# Patient Record
Sex: Female | Born: 1950 | Race: White | Hispanic: No | Smoking: Never smoker
Health system: Southern US, Community
[De-identification: ages and names within clinical notes are randomized; demographics above are authoritative.]

## PROBLEM LIST (undated history)

## (undated) DIAGNOSIS — Z9889 Other specified postprocedural states: Secondary | ICD-10-CM

## (undated) DIAGNOSIS — C801 Malignant (primary) neoplasm, unspecified: Secondary | ICD-10-CM

## (undated) DIAGNOSIS — R112 Nausea with vomiting, unspecified: Secondary | ICD-10-CM

## (undated) DIAGNOSIS — H3530 Unspecified macular degeneration: Secondary | ICD-10-CM

## (undated) HISTORY — PX: ABDOMINAL HYSTERECTOMY: SHX81

## (undated) HISTORY — PX: MASTECTOMY: SHX3

## (undated) HISTORY — PX: BREAST SURGERY: SHX581

## (undated) HISTORY — PX: TURBINATE REDUCTION: SHX6157

## (undated) HISTORY — PX: BREAST EXCISIONAL BIOPSY: SUR124

---

## 1997-12-28 ENCOUNTER — Other Ambulatory Visit: Admission: RE | Admit: 1997-12-28 | Discharge: 1997-12-28 | Payer: Self-pay | Admitting: Obstetrics and Gynecology

## 1999-03-08 ENCOUNTER — Other Ambulatory Visit: Admission: RE | Admit: 1999-03-08 | Discharge: 1999-03-08 | Payer: Self-pay | Admitting: Obstetrics and Gynecology

## 2000-02-10 ENCOUNTER — Other Ambulatory Visit: Admission: RE | Admit: 2000-02-10 | Discharge: 2000-02-10 | Payer: Self-pay | Admitting: Obstetrics and Gynecology

## 2000-02-10 ENCOUNTER — Encounter (INDEPENDENT_AMBULATORY_CARE_PROVIDER_SITE_OTHER): Payer: Self-pay | Admitting: Specialist

## 2000-02-21 ENCOUNTER — Other Ambulatory Visit: Admission: RE | Admit: 2000-02-21 | Discharge: 2000-02-21 | Payer: Self-pay | Admitting: Obstetrics and Gynecology

## 2000-08-07 ENCOUNTER — Inpatient Hospital Stay (HOSPITAL_COMMUNITY): Admission: RE | Admit: 2000-08-07 | Discharge: 2000-08-09 | Payer: Self-pay | Admitting: Obstetrics and Gynecology

## 2000-08-07 ENCOUNTER — Encounter (INDEPENDENT_AMBULATORY_CARE_PROVIDER_SITE_OTHER): Payer: Self-pay

## 2001-08-24 ENCOUNTER — Emergency Department (HOSPITAL_COMMUNITY): Admission: EM | Admit: 2001-08-24 | Discharge: 2001-08-24 | Payer: Self-pay | Admitting: Emergency Medicine

## 2001-08-24 ENCOUNTER — Encounter: Payer: Self-pay | Admitting: Emergency Medicine

## 2003-10-29 ENCOUNTER — Inpatient Hospital Stay (HOSPITAL_COMMUNITY): Admission: EM | Admit: 2003-10-29 | Discharge: 2003-10-30 | Payer: Self-pay | Admitting: Emergency Medicine

## 2004-02-15 ENCOUNTER — Encounter: Admission: RE | Admit: 2004-02-15 | Discharge: 2004-02-15 | Payer: Self-pay | Admitting: Otolaryngology

## 2004-10-19 ENCOUNTER — Other Ambulatory Visit: Admission: RE | Admit: 2004-10-19 | Discharge: 2004-10-19 | Payer: Self-pay | Admitting: Family Medicine

## 2004-11-01 ENCOUNTER — Encounter: Admission: RE | Admit: 2004-11-01 | Discharge: 2004-11-01 | Payer: Self-pay | Admitting: Family Medicine

## 2008-10-27 ENCOUNTER — Emergency Department (HOSPITAL_COMMUNITY): Admission: EM | Admit: 2008-10-27 | Discharge: 2008-10-28 | Payer: Self-pay | Admitting: Emergency Medicine

## 2009-06-16 ENCOUNTER — Encounter: Admission: RE | Admit: 2009-06-16 | Discharge: 2009-06-16 | Payer: Self-pay | Admitting: Family Medicine

## 2010-06-16 ENCOUNTER — Other Ambulatory Visit: Payer: Self-pay | Admitting: Family Medicine

## 2010-06-16 DIAGNOSIS — Z9012 Acquired absence of left breast and nipple: Secondary | ICD-10-CM

## 2010-07-06 ENCOUNTER — Ambulatory Visit
Admission: RE | Admit: 2010-07-06 | Discharge: 2010-07-06 | Disposition: A | Discharge status: Home / Self Care | Payer: BC Managed Care – PPO | Source: Ambulatory Visit | Attending: Family Medicine | Admitting: Family Medicine

## 2010-07-06 DIAGNOSIS — Z9012 Acquired absence of left breast and nipple: Secondary | ICD-10-CM

## 2010-10-07 NOTE — Op Note (Signed)
[facility]  Patient:    [name] [name], [name] [name]                   MRN: [id number] Proc. Date: 08/07/00 Adm. Date:  [id number] Attending:  Jenean Lindau CC:         Chales Salmon. Abigail Miyamoto, M.D.   Operative Report  PREOPERATIVE DIAGNOSES: 1. Leiomyoma uteri. 2. Endometriosis.  POSTOPERATIVE DIAGNOSES: 1. Leiomyoma uteri. 2. Endometriosis.  OPERATION:  Total abdominal hysterectomy with bilateral salpingo-oophorectomy.  SURGEON:  Laqueta Linden, M.D.  ASSISTANT:  Andres Ege, M.D.  ANESTHESIA:  General endotracheal.  ESTIMATED BLOOD LOSS:  200 cc.  URINE OUTPUT:  450 cc.  FLUIDS:  1800 cc of crystalloid.  COUNTS:  Correct x 2.  COMPLICATIONS:  None.  INDICATIONS:  [name] [name] is a 60 year old gravida 1, para 1 female initially referred in 1998 for evaluation of pelvic pain.  At that point, she was felt to have fibroids on ultrasound.  She also was noted to have multiple endometrial polyps with a history of abnormal bleeding.  In December 1998, she underwent diagnostic laparoscopy with laser and hysteroscopic resection of polyps.  Findings at the time of surgery included mild to moderate endometriosis with multiple fibroids and endometrial polyps.  She had laser of the endometriosis implants and removal of the polyps and had several years of improvement in her pain and periods.  She began having some worsening dyspareunia and menometrorrhagia starting in the fall of 2001.  An attempt was made to cycle her with progesterone but she was totally intolerant of this. She reported that she had significant worsening of cramping but with her menses as well as a lot of pelvic pressure and discomfort unresponsive to nonsteroidal medications.  She had an endometrial biopsy which was benign. Various alternatives were offered including wait and see versus repeat conservative surgery versus definitive surgery, particularly in view of her age and  imminent menopause.  The patient elected to proceed with definitive surgical management.  She has a distant history of breast cancer with no evidence of disease in over 16 years and elects to start on low dose estrogen replacement therapy postoperatively.  She has been extensively counseled on risks, benefits, alternatives and complications and agrees to proceed.  DESCRIPTION OF PROCEDURE:  The patient was taken to the operating room.  After proper identifications and consents were ascertained, she was placed on the operating table in the supine position.  After the induction of general endotracheal anesthesia, she was placed in the frog-leg position and the abdomen, perineum and vagina were prepped and draped in the routine sterile fashion.  A transurethral Foley was placed.  A Pfannenstiel incision was then made and carried down to the level of the anterior rectus fasciaa.  The fasci was incised laterally, superiorly and inferiorly.  The rectus muscles were separated, and the parietal peritoneum elevated and incised.  The incision was extended superiorly and inferiorly to the level of the bladder.  Palpation of the upper abdomen revealed smooth renal contours bilaterally with some right upper quadrant adhesions and the gallbladder not well felt.  The appendix was not well visualized and had been noted on prior laparoscopy to be quite short and attenuated but normal in appearance.  The bowel was packed in the upper abdomen using a moistened lap pack and the self retaining retractor was placed.  The uterus was noted to be distorted by multiple fibroids.  There was quite a bit of dense scarring of  the bladder to the anterior lower uterine segment.  Both uterosacral ligaments were attenuated.  The tubes and ovaries appeared normal with several powder burns bilaterally and the right ovary was densely adherent into the ovarian fossa.  The uterus was elevated in the upper field using Kelly  clamps.  The round ligament on the left was clamped, cut and suture ligated with dissection carried forward and posteriorly in the broad ligament.  The pelvic sidewall was opened and the ureter was located deep in the pelvis of normal caliber and peristalsing normally.  The infundibulopelvic ligament on the left was then clamped, cut and doubly ligated with a free tie and a stitch of 0 Vicryl.  Attention was then turned to the right side.  The round ligament was similarly transected, and the pelvic sidewall opened and the ureter visualized.  Sharp and blunt dissection was required to elevate the ovarian out of the ovarian fossa and free this up for it to be removed.  The infundibulopelvic was then clamped, cut and doubly ligated.  Sharp and blunt dissection were then used to dissect the bladder off of the lower uterine segment.  This encountered several bleeding points to the dense adherence. Eventually, the bladder was mobilized without obvious injury to the bladder or hematuria noted.  The uterine vessels were skeletonized and curved Heaney clamps were placed across the vessels perpendicularly at the level of the internal os.  The pedicles were cut and suture ligated with 0 Vicryl suture. Straight Heaney clamps were then placed across the upper cardinal ligaments bilaterally with the pedicle cut and suture ligated, first with the ligature such that the uterine vessels were doubly ligated bilaterally.  Successive straight Heaney clamps were then placed across the cardinal ligaments to the level of the upper vaginal angle at which point curved Haney clamps were placed across the uterosacral ligaments and upper vaginal angles.  The cervix was then circumscribed with the scalpel and the specimen passed off the table for final sectioning.  The uterosacral ligament pedicles were Heaney ligated and packed with 0 Vicryl suture.  The Richardson angle sutures were placed at both vaginal angles with  excellent hemostasis noted.  The remainder of the vaginal cuff was closed with interrupted figure-of-eight sutures of 0 Vicryl.  Copious lavage was accomplished.  Several small bleeding points were cauterized.  The uterosacral ligaments were plicated posteriorly to prevent enterocele formation.  Both ureters were again visualized, of normal caliber well out of the operative field and peristalsing normally.  Copious lavage was again accomplished.  All pedicles and peritoneal surfaces had excellent hemostasis.  All lap packs and retractors were then removed.  The parietal peritoneum was closed in a running fashion using 2-0 Vicryl suture, and the rectus muscles were loosely reapproximated in the midline.  After subfascial hemostasis was ascertained, the fascia was closed in both lateral aspects in the midline using a running stitch of 0 Vicryl or 0 Maxon.  Subcutaneous hemostasis was ascertained and staples, Steri-Strips and pressure dressing were then applied.  The patient was stable and extubated on transfer to the recovery room.  Estimated blood loss was 200 cc.  Urine output 450 cc.  Counts correct x 2.  Complications none. DD:  08/07/00 TD:  08/07/00 Job: [id number]

## 2010-10-07 NOTE — H&P (Signed)
[facility]  Patient:    [name], [name]                   MRN: [id number] Adm. Date:  [id number] Attending:  Jenean Lindau                         History and Physical  IDENTIFYING DATA:  [name] [name] is a 60 year old perimenopausal female with worsening pelvic pain, dyspareunia and abnormal bleeding with known endometriosis and fibroids admitted for TAH, BSO.  HISTORY OF PRESENT ILLNESS:  [name] is a 60 year old, gravida 1, para 1 female who has been followed by this physician since November of 1998 when she presented with pelvic pain with an extensive negative GI and urologic evaluation. She was noted to have ultrasound findings of fibroids and what were felt to be meltable endometrial polyps. In December of 1998, she underwent hysteroscopic evaluation with resection of the polyps followed by laparoscopy with laser of endometriosis implants. She was noted to have excellent relief of her symptoms of pain, dysmenorrhea, and dyspareunia for several years. She presented in the fall of 2001 with worsening pelvic pressure, abnormal bleeding with episodes of bleeding occurring at two week intervals, worsening dysmenorrhea, dyspareunia, and constant pelvic pain even when she was not on her period. She underwent endometrial sampling which revealed breakdown consistent with anovulation. She attempted progesterone cycling in an attempt to manage the situation medically. She was intolerant of the progesterone and did not take it more than a few days. She is aware that at her age with impending menopause that many of her symptoms may resolved. However, she continues to have irregular and frequent bleeding episodes and desires definitive surgical management. She is therefore to undergo a TAH, BSO as management for her fibroids likely submucosal in origin as well as her endometriosis which is symptomatic. She has seen the informed consent film, been  extensively counselled as to the risks, benefits, alternatives and complications of the procedure and agrees to proceed. A discussion was held with the patient regarding her current perimenopausal symptoms which will likely increase postoperatively after removal of her ovaries. As she is greater than 15 years out from her mastectomy for breast cancer, she elected to proceed with low dose estrogen replacement therapy. She was given samples of a Vivelle dot 0.0375 mg to apply prior to arrival at the hospital on the day of surgery and this will be monitored postoperatively.  PAST MEDICAL HISTORY: 1. Mitral valve prolapse, essentially asymptomatic. 2. Migraine headaches. 3. Breast cancer diagnosed in 1986 status post mastectomy without chemotherapy    or radiation greater than 10 years now with no evidence of recurrent    disease.  PAST SURGICAL HISTORY: 1. Two benign cysts removed from her breasts in the 1970s. 2. 1986 left mastectomy with reconstruction. 3. 1998 hysteroscopy with resection of polyps followed by laparoscopy    with laser of endometriosis implants.  OBSTETRICAL HISTORY:  Vaginal delivery x 1.  ALLERGIES:  No known drug allergies or latex sensitivities.  CURRENT MEDICATIONS: 1. Iron once daily. 2. Claritin 10 mg p.r.n. 3. ______ three daily. 4. Excedrin migraine p.r.n. headaches. 5. Vivelle 0.0375 mg patch twice weekly as noted above.  TRANSFUSION HISTORY:  Negative.  SOCIAL HISTORY:  The patient is married with one grown son. 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 She denies smoking, excessive alcohol or illicit drug use.  FAMILY HISTORY:  Positive for  stomach carcinoma in her father who is deceased otherwise noncontributory.  REVIEW OF SYSTEMS:  Notable for the history of present illness. She has been mildly anemic from the excessive bleeding and has been on iron supplementation for the past two months with the lowest hemoglobin  being 10.2 in October of 2001. She has intermittently complained of swelling in her wrists and fingers with no other edema identified. Cardiovascular, respiratory, neurologic, GI all negative.  PHYSICAL EXAMINATION:  (Performed prior to admission)  GENERAL:  Revealed a healthy white female in no apparent distress.  VITAL SIGNS:  Height 5 foot, 5 inches, weight 162 pounds. Blood pressure 112/64.  HEENT:  Grossly negative. Oropharynx clear.  NECK:  Supple without thyromegaly or lymphadenopathy.  CHEST:  Without spine or CVA tenderness.  LUNGS:  Clear to auscultation.  HEART:  Regular rate and rhythm without murmur, gallop or rub. Carotids +2 and equal without bruit.  BREASTS:  Right breast without mass, tenderness or nipple discharge. Left breast surgically absent with an implant in place. No adenopathy noted. No chest wall mass noted.  ABDOMEN:  Revealed no hepatosplenomegaly, mass or tenderness.  PELVIC:  Revealed an 8-10 week size irregular uterus, poorly mobile and high in the pelvis, thickened uterosacral ligaments with tenderness on palpation, no obvious adnexal masses with mild tenderness bilaterally. Rectovaginal confirmatory. Tenaculum revealed minimal cervicouterine descent.  EXTREMITIES:  No obvious edema. Calves nontender.  NEUROLOGIC:  Grossly nonfocal  LABORATORY DATA:  Revealed a hemoglobin of 11.6, hematocrit of 35.2, normal white count and platelets. Normal comprehensive metabolic profile, negative coagulation studies, negative urine analysis.  EKG normal sinus rhythm. No chest x-ray per anesthesia.  IMPRESSION: 1. Known leiomyomata uteri and endometriosis, increasing symptomatic and    unresponsive to medical management. 2. Perimenopausal. 3. Distant history of breast cancer status post mastectomy. 4. Migraine headaches. 5. Mitral valve prolapse, asymptomatic, takes antibiotics for dental    procedures.  PLAN:  The patient is admitted for same day  surgery. She is to undergo a TAH, BSO. She has been counselled as to the risks, benefits, alternatives and  complications including the likelihood that her symptoms will abate or resolve with imminent menopause. She desires to proceed with definitive surgery. She elects to proceed with low dose estrogen replacement therapy postoperatively as noted above. Potential issues with respect to her distant breast cancer have been discussed with the patient who voices her understanding of potential risks. The patient is admitted now for same days surgery. DD:  08/07/00 TD:  08/07/00 Job: [id number]

## 2010-10-07 NOTE — Consult Note (Signed)
NAMESARANN, TREGRE                      ACCOUNT NO.:  [id number]   MEDICAL RECORD NO.:  [id number]                   PATIENT TYPE:  INP   LOCATION:  2013                                 FACILITY:  [facility]   PHYSICIAN:  Meade Maw, M.D.                 DATE OF BIRTH:  December 14, 1950   DATE OF CONSULTATION:  DATE OF DISCHARGE:                                   CONSULTATION   INDICATION FOR CONSULTATION:  Left arm and neck pain.   HISTORY:  [name] [name] is a very-pleasant 60 year old female who I previously  evaluated in 2003 for atypical chest pain.  She underwent a stress  Cardiolite at this time.  There was no inducible ischemia noted.  The  patient has noted that she has had an upper respiratory infection during the  past week for which she has increased her pseudoephedrine use.  She also  notes that she has been under a significant amount of stress with her aging  parents, and she will often experience neck pains when she is under stress.  However, on the day of admission, she developed some left-shoulder tightness  which radiated to her left hand, and some jaw pain.  She has mild nausea on  the 8th.  Upon arrival into the emergency room, the initial ECG revealed no  ischemia.  Serial cardiac enzymes were performed which were negative.  Her  CK peak was 75 with a CK-MB of 1.2. Her troponin I was noted to be less than  0.01.   PAST MEDICAL HISTORY:  1. Significant for sinusitis.  2. Dyslipidemia.  3. Post menopausal status.  4. Breast cancer, diagnosed in 1986.  5. Seasonal allergies.   MEDICATIONS ON ADMISSION:  1. Ferritin.  2. Actonel.  3. Calcium.  4. Aspirin 81 mg daily.  5. Folic acid.  6. Multivitamins.   MEDICATIONS:  Her inpatient medications include:  1. Avelox 400 mg daily.  2. Flonase.  3. Clarinex D.  4. Phenergan.  5. __________.  6. Aspirin 81 mg daily.   REVIEW OF SYSTEMS:  She relays that she has had left arm pain and numbness  as noted above, some  left facial pressure as well as diaphoresis and  palpitations.  She has had no orthopnea, pedal edema, presyncope or syncope.  No GI symptoms.  Nasal congestion as noted above.   SOCIAL HISTORY:  She is married.  She has recently retired from the school  system.  Her children are adults.  She is taking care of both her aging  mother and her mother-in-law which she states results in a significant  amount of stress.   FAMILY HISTORY:  Mother has hypertension.  Father passed from gastric  cancer.   ALLERGIES:  She has no known drug allergies.   PHYSICAL EXAMINATION:  GENERAL:  A 60 year old female, in no acute distress,  pleasant and alert.  Orlene Erm is appropriate.  VITAL SIGNS:  Her  initial blood pressure was noted to be 152/106, and  subsequently decreased to 115/71.  Heart rate 78.  HEENT:  Unremarkable.  She has good carotid upstrokes, no carotid bruits.  PULMONARY:  Breath sounds which are equal and clear to auscultation.  CARDIOVASCULAR:  A normal S1, normal S2, regular rate and rhythm.  No rubs,  murmurs or gallops are noted.  ABDOMEN:  Soft, benign, nontender.  EXTREMITIES:  Revealed no peripheral edema.  Distal pulses are 2+ and  palpable.  SKIN:  Warm and dry.  NEUROLOGIC:  Nonfocal.   LABORATORY DATA:  Cardiac enzymes as noted above.  Initial pH was 7.40, pCO2  of 36, bicarbonate 22.  White count 6.7, hemoglobin 12, hematocrit 38,  platelets count 375,000.  INR is 1.0.  Troponin I was less than 0.05.  TSH  is 1.2.  LDL is 124.  HDL is 47.   Chest x-ray reveals no active disease.   The patient underwent a stress Cardiolite.  There was no inducible ischemia  by ECG or clinical criteria.  The patient's ejection fraction was noted to  be 68%.   IMPRESSION:  1. Atypical chest pain with normal stress Cardiolite, ejection fraction of     68%.  She does not need further cardiac workup.  2. Neck pain.  Suspect that this may be more stress related and     musculoskeletal  pain.  We suggested methods of stress reduction.  The     patient is currently also having problems with insomnia.  We discussed     good sleep hygiene.  3. Health maintenance.  Her LDL goal is less than 130.  She currently is at     goal.  4. Hypertension. Blood pressure was noted to be elevated on admission. She     is now normotensive.  We will need to continue to monitor.  No further     cardiac workup is indicated at this time.                                               Meade Maw, M.D.    HP/MEDQ  D:  10/30/2003  T:  10/31/2003  Job:  [id number]

## 2010-10-07 NOTE — H&P (Signed)
[name] [name], [name] [name]                      ACCOUNT NO.:  [id number]   MEDICAL RECORD NO.:  [id number]                   PATIENT TYPE:  EMS   LOCATION:  [facility]JO                                 FACILITY:  [facility]   PHYSICIAN:  Deirdre Peer. Polite, M.D.              DATE OF BIRTH:  05/07/1951   DATE OF ADMISSION:  10/29/2003  DATE OF DISCHARGE:                                HISTORY & PHYSICAL   CHIEF COMPLAINT:  Chest pressure.   HISTORY OF PRESENT ILLNESS:  The patient is a 60 year old female with  recently diagnosed high cholesterol, currently perimenopausal and also a  history of breast cancer diagnosed in 1986 who presents to the ED after a  week long history of discomfort in the left shoulder which today  precipitated to tightness in the chest and in the jaw area as well as  radiating to the left arm and hand.  The patient did not experience any  shortness of breath, but did experience some palpitations.  She had  experienced some other symptoms that most likely may be related to her  recent sinus problems over the last week.  She states that she has had an  episode of diaphoresis times one yesterday, some nausea and emesis times  one.  In addition to that, she has been having some colored sputum as well  as significant postnasal drip and sinus pressure.  She denies any fever or  chills. For her sinus symptoms she has taken over the counter Tylenol with  pseudoephedrine; she states she has been taking this medication every six  hours for approximately one week.  Because of the patient's symptoms of  pressure and numbness in her left arm as well as tightness in her jaw, she  presented to the ED for evaluation.  In the ED the patient was evaluated and  was found to be mildly hypertensive.  EKG was without acute changes.  Point  of care enzymes were within normal limits.  [facility] were called  for further evaluation. Of note, the patient has had a Cardiolite  approximately two  years ago which was within normal limits according to the  patient's report.   PAST MEDICAL HISTORY:  1. High cholesterol.  2. Perimenopausal.  3. Breast cancer diagnosed in 1986 status post left mastectomy.  The patient     denies any chemotherapy, any tamoxifen or any XRT.  4. Seasonal allergies.   MEDICATIONS ON ADMISSION:  1. Claritin.  2. Actonel.  3. Calcium.  4. Aspirin 81 mg.  5. Folic acid.  6. Multivitamin.   SOCIAL HISTORY:  Negative for tobacco, alcohol or drugs.  The patient is a  retired Engineer, site.   PAST SURGICAL HISTORY:  1. Hysterectomy in 2002 secondary to dysfunctional uterine bleeding.  2. The patient denies an appendectomy or cholecystectomy.   ALLERGIES:  No known drug allergies.   REVIEW OF SYMPTOMS:  As stated in the history  of present illness.  GENERAL:  She has had no fever or chills. HEENT:  Positive colored sputum.  GI:  Mild  nausea with emesis times one yesterday.  CARDIAC:  Diaphoresis times one.  PULMONARY:  No orthopnea.  No PND.  EXTREMITIES:  No pedal edema.   FAMILY HISTORY:  Mother has hypertension.  Father is deceased secondary to  gastric cancer.   PHYSICAL EXAMINATION:  VITAL SIGNS:  Temperature 98.5, blood pressure  152/106 with repeat blood pressure of 115/71, pulse 77, respiratory rate 18,  saturation is 99%.  HEENT:  Within normal limits, however, there is tenderness over the sinus  area.  There are no oral lesions.  There is no posterior pharynx erythema.  CHEST:  Clear.  CARDIOVASCULAR:  Regular with no S3.  ABDOMEN:  Soft and non-tender.  EXTREMITIES:  No edema.  2+ pulses.  RECTAL EXAM:  Deferred.  NEUROLOGIC:  Nonfocal.   LABORATORY DATA:  B-MET:  Sodium 142, potassium 3.7, chloride 108, BUN 9.  CBC:  White count 6.7,  hemoglobin 12.9, hematocrit 38.5, platelets 375,000.  INR 1.0.   ASSESSMENT:  1. Atypical chest pain.  Please note the patient has a reported negative     Cardiolite approximately two years  ago.  2. Sinusitis, taking pseudoephedrine q.6h times one week.  3. History of breast carcinoma status post left mastectomy in 1986.  4. Perimenopausal which is very symptomatic with frequent night sweats and     hot flashes.  5. High cholesterol.  Of note, the patient is not on medication; however, it     was recommended by her primary care physician.  6. Seasonal allergies.  7. Old records suggest questionable mitral valve prolapse.   RECOMMENDATIONS:  It is recommended that the patient be admitted to a  telemetry floor bed, obtain serial cardiac enzymes, repeat lipids, attempt  to obtain prior echo and cardiac evaluation to determine if a repeat  Cardiolite is indicated.  Also we will check a sinus x-ray and give empiric  antibiotics for sinusitis as well as a nasal steroid as well as Claritin in  an attempt to decrease her pseudoephedrine use.  Please note that the  patient's symptoms of  palpitations and pressure may be related to her excessive pseudoephedrine  use.  Also with the reported history of mitral valve prolapse, she could be  having some mild panic attack disorder related to that.  We will make  further recommendations after review of the above studies.                                                Deirdre Peer. Polite, M.D.    RDP/MEDQ  D:  10/29/2003  T:  10/29/2003  Job:  [id number]   cc:   Chales Salmon. Abigail Miyamoto, M.D.  [address]  [place]  [place] [address]  Fax: [phone]

## 2011-06-08 ENCOUNTER — Other Ambulatory Visit: Payer: Self-pay | Admitting: Family Medicine

## 2011-06-08 DIAGNOSIS — Z1231 Encounter for screening mammogram for malignant neoplasm of breast: Secondary | ICD-10-CM

## 2011-06-08 DIAGNOSIS — Z9012 Acquired absence of left breast and nipple: Secondary | ICD-10-CM

## 2011-07-10 ENCOUNTER — Ambulatory Visit
Admission: RE | Admit: 2011-07-10 | Discharge: 2011-07-10 | Disposition: A | Discharge status: Home / Self Care | Payer: BC Managed Care – PPO | Source: Ambulatory Visit | Attending: Family Medicine | Admitting: Family Medicine

## 2011-07-10 DIAGNOSIS — Z1231 Encounter for screening mammogram for malignant neoplasm of breast: Secondary | ICD-10-CM

## 2011-07-10 DIAGNOSIS — Z9012 Acquired absence of left breast and nipple: Secondary | ICD-10-CM

## 2011-07-14 ENCOUNTER — Other Ambulatory Visit: Payer: Self-pay | Admitting: Family Medicine

## 2011-07-14 DIAGNOSIS — R928 Other abnormal and inconclusive findings on diagnostic imaging of breast: Secondary | ICD-10-CM

## 2011-07-24 ENCOUNTER — Ambulatory Visit
Admission: RE | Admit: 2011-07-24 | Discharge: 2011-07-24 | Disposition: A | Discharge status: Home / Self Care | Payer: BC Managed Care – PPO | Source: Ambulatory Visit | Attending: Family Medicine | Admitting: Family Medicine

## 2011-07-24 DIAGNOSIS — R928 Other abnormal and inconclusive findings on diagnostic imaging of breast: Secondary | ICD-10-CM

## 2015-10-26 ENCOUNTER — Other Ambulatory Visit: Payer: Self-pay | Admitting: Family Medicine

## 2015-10-26 DIAGNOSIS — Z1231 Encounter for screening mammogram for malignant neoplasm of breast: Secondary | ICD-10-CM

## 2015-11-09 ENCOUNTER — Ambulatory Visit
Admission: RE | Admit: 2015-11-09 | Discharge: 2015-11-09 | Disposition: A | Discharge status: Home / Self Care | Payer: Medicare Other | Source: Ambulatory Visit | Attending: Family Medicine | Admitting: Family Medicine

## 2015-11-09 DIAGNOSIS — Z1231 Encounter for screening mammogram for malignant neoplasm of breast: Secondary | ICD-10-CM

## 2015-11-11 ENCOUNTER — Other Ambulatory Visit: Payer: Self-pay | Admitting: Family Medicine

## 2015-11-11 DIAGNOSIS — R928 Other abnormal and inconclusive findings on diagnostic imaging of breast: Secondary | ICD-10-CM

## 2015-11-16 ENCOUNTER — Other Ambulatory Visit: Payer: Self-pay | Admitting: Family Medicine

## 2015-11-16 ENCOUNTER — Ambulatory Visit
Admission: RE | Admit: 2015-11-16 | Discharge: 2015-11-16 | Disposition: A | Discharge status: Home / Self Care | Payer: Medicare Other | Source: Ambulatory Visit | Attending: Family Medicine | Admitting: Family Medicine

## 2015-11-16 DIAGNOSIS — R928 Other abnormal and inconclusive findings on diagnostic imaging of breast: Secondary | ICD-10-CM

## 2015-11-16 DIAGNOSIS — R921 Mammographic calcification found on diagnostic imaging of breast: Secondary | ICD-10-CM

## 2015-11-18 ENCOUNTER — Ambulatory Visit
Admission: RE | Admit: 2015-11-18 | Discharge: 2015-11-18 | Disposition: A | Discharge status: Home / Self Care | Payer: Medicare Other | Source: Ambulatory Visit | Attending: Family Medicine | Admitting: Family Medicine

## 2015-11-18 DIAGNOSIS — R928 Other abnormal and inconclusive findings on diagnostic imaging of breast: Secondary | ICD-10-CM

## 2015-11-18 DIAGNOSIS — R921 Mammographic calcification found on diagnostic imaging of breast: Secondary | ICD-10-CM

## 2015-12-06 ENCOUNTER — Ambulatory Visit: Payer: Self-pay | Admitting: Surgery

## 2015-12-06 DIAGNOSIS — N6091 Unspecified benign mammary dysplasia of right breast: Secondary | ICD-10-CM

## 2015-12-06 NOTE — H&P (Signed)
[name] [name]. [name] [name] 12/06/2015 10:52 AM Location: [facility] Patient #: [name] [name] DOB: [birth year] Single / Language: [name] [name] / Race: White Female  History of Present Illness [name] Moores A. Ahmad Vanwey MD; 12/06/2015 12:16 PM) Patient words: [facility]   Patient presents for discussion of options after abnormal mammogram revealed right breast microcalcifications this year. This was a screening mammogram. Biopsy showed atypical lobular hyperplasia. He areas about 2-3 cm in maximal diameter. Patient denies any history of breast mass, breast pain or nipple discharge. Has a history of left breast cancer from [place] and underwent mastectomy and reconstruction. She has no other complaints. She had her implant exchange in 2009.              Breast, right, needle core biopsy, upper outer quadrant ATYPICAL LOBULAR HYPERPLASIA MICROSCOPIC INTRADUCTAL PAPILLOMA COMPLEX SCLEROSING LESION WITH USUAL DUCTAL HYPERPLASIA FIBROCYSTIC CHANGES WITH CALCIFICATIONS Microscopic Comment Immunohistochemistry stains for ck5/6 has been performed, supporting.  The patient is a 65 year old female.   Other Problems [name] [name], CMA; 12/06/2015 10:53 AM) Arthritis Breast Cancer Gastroesophageal Reflux Disease General anesthesia - complications Hypercholesterolemia Lump In Breast Migraine Headache  Past Surgical History [name] [name], CMA; 12/06/2015 10:53 AM) Breast Biopsy Bilateral. Breast Reconstruction Left. Hysterectomy (not due to cancer) - Complete Mastectomy Left.  Diagnostic Studies History [name] [name], [place]; 12/06/2015 10:53 AM) Colonoscopy >10 years ago Mammogram within last year Pap Smear 1-5 years ago  Allergies [name] [name], CMA; 12/06/2015 10:55 AM) No Known Drug Allergies 12/06/2015  Medication History [name] [name], [place]; 12/06/2015 10:57 AM) Glucosamine 1500 Complex (Oral) Active. Multi Vitamin Daily (Oral) Active. Nasacort  (55MCG/ACT Aerosol Soln, Nasal) Active. NexIUM (10MG  Packet, Oral) Active. Aspirin (81MG  Tablet, Oral) Active. PreserVision AREDS 2 (Oral) Active. Omega-3 Fish Oil/Vitamin D3 (1000-1000MG -UNIT Capsule, Oral) Active. Medications Reconciled  Social History [name] [name], [place]; 12/06/2015 10:53 AM) Alcohol use Occasional alcohol use. Caffeine use Carbonated beverages, Coffee, Tea. No drug use Tobacco use Never smoker.  Family History [name] [name], [place]; 12/06/2015 10:53 AM) Arthritis Mother. Breast Cancer Family Members In General. Cancer Father, Mother.  Pregnancy / Birth History [name] [name], [place]; 12/06/2015 10:53 AM) Age at menarche 65 years. Age of menopause 51-55 Contraceptive History Oral contraceptives. Gravida 1 Maternal age 45-35 Para 1 Regular periods     Review of Systems [name] [name] CMA; 12/06/2015 10:53 AM) General Present- Fatigue. Not Present- Appetite Loss, Chills, Fever, Night Sweats, Weight Gain and Weight Loss. Skin Not Present- Change in Wart/Mole, Dryness, Hives, Jaundice, New Lesions, Non-Healing Wounds, Rash and Ulcer. HEENT Present- Seasonal Allergies and Sinus Pain. Not Present- Earache, Hearing Loss, Hoarseness, Nose Bleed, Oral Ulcers, Ringing in the Ears, Sore Throat, Visual Disturbances, Wears glasses/contact lenses and Yellow Eyes. Respiratory Not Present- Bloody sputum, Chronic Cough, Difficulty Breathing, Snoring and Wheezing. Breast Not Present- Breast Mass, Breast Pain, Nipple Discharge and Skin Changes. Cardiovascular Not Present- Chest Pain, Difficulty Breathing Lying Down, Leg Cramps, Palpitations, Rapid Heart Rate, Shortness of Breath and Swelling of Extremities. Gastrointestinal Not Present- Abdominal Pain, Bloating, Bloody Stool, Change in Bowel Habits, Chronic diarrhea, Constipation, Difficulty Swallowing, Excessive gas, Gets full quickly at meals, Hemorrhoids, Indigestion, Nausea, Rectal Pain and  Vomiting. Female Genitourinary Not Present- Frequency, Nocturia, Painful Urination, Pelvic Pain and Urgency. Musculoskeletal Present- Joint Stiffness. Not Present- Back Pain, Joint Pain, Muscle Pain, Muscle Weakness and Swelling of Extremities. Neurological Not Present- Decreased Memory, Fainting, Headaches, Numbness, Seizures, Tingling, Tremor, Trouble walking and Weakness. Psychiatric Not Present- Anxiety, Bipolar, Change  in Sleep Pattern, Depression, Fearful and Frequent crying. Endocrine Not Present- Cold Intolerance, Excessive Hunger, Hair Changes, Heat Intolerance, Hot flashes and New Diabetes. Hematology Not Present- Blood Thinners, Easy Bruising, Excessive bleeding, Gland problems, HIV and Persistent Infections.  Vitals [name] [name] CMA; 12/06/2015 10:53 AM) 12/06/2015 10:52 AM Weight: 166.13 lb Height: 65in Body Surface Area: 1.83 m Body Mass Index: 27.64 kg/m  Temp.: 98.64F(Oral)  Pulse: 82 (Regular)  BP: 120/72 (Sitting, Left Arm, Standard)      Physical Exam ([name] Nall A. Jada Kuhnert MD; 12/06/2015 12:17 PM)  General Mental Status-Alert. General Appearance-Consistent with stated age. Hydration-Well hydrated. Voice-Normal.  Chest and Lung Exam Chest and lung exam reveals -quiet, even and easy respiratory effort with no use of accessory muscles and on auscultation, normal breath sounds, no adventitious sounds and normal vocal resonance. Inspection Chest Wall - Normal. Back - normal.  Breast Note: Left breast surgically absent. Scar noted for mastectomy and implant in place. No masses. Right breast shows bruising at the 11 to 12 o'clock position with a small hematoma. No signs of infection. No masses or drainage noted.  Cardiovascular Cardiovascular examination reveals -normal heart sounds, regular rate and rhythm with no murmurs and normal pedal pulses bilaterally.  Neurologic Neurologic evaluation reveals -alert and oriented x 3 with no  impairment of recent or remote memory. Mental Status-Normal.  Musculoskeletal Normal Exam - Left-Upper Extremity Strength Normal and Lower Extremity Strength Normal. Normal Exam - Right-Upper Extremity Strength Normal and Lower Extremity Strength Normal.  Lymphatic Head & Neck  General Head & Neck Lymphatics: Bilateral - Description - Normal. Axillary  General Axillary Region: Bilateral - Description - Normal. Tenderness - Non Tender.    Assessment & Plan (Burk Hoctor A. 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 Pt agrees to proceed.  Discuss surgical excision versus observation. The pros and cons of each discussed. The risk of malignancy discussed in this circumstance of being anywhere from 3-10% on final pathology. Given her previous breast cancer history she has opted to undergo right breast seemed localized lumpectomy.  Current Plans The anatomy and the physiology was discussed. The pathophysiology and natural history of the disease was discussed. Options were discussed and recommendations were made. Technique, risks, benefits, & alternatives were discussed. Risks such as stroke, heart attack, bleeding, indection, death, and other risks discussed. Questions answered. The patient agrees to proceed. You are being scheduled for surgery - Our schedulers will call you.  You should hear from our office's scheduling department within 5 working days about the location, date, and time of surgery. We try to make accommodations for patient's preferences in scheduling surgery, but sometimes the OR schedule or the surgeon's schedule prevents [place] from making those accommodations.  If you have not heard from our office [phone]) in 5 working days, call  the office and ask for your surgeon's nurse.  If you have other questions about your diagnosis, plan, or surgery, call the office and ask for your surgeon's nurse.  Pt Education - [facility] Free Text Education/Instructions: discussed with patient and provided information. You are being scheduled for surgery - Our schedulers will call you.  You should hear from our office's scheduling department within 5 working days about the location, date, and time of surgery. We try to make accommodations for patient's preferences in  scheduling surgery, but sometimes the OR schedule or the surgeon's schedule prevents [place] from making those accommodations.  If you have not heard from our office [phone]) in 5 working days, call the office and ask for your surgeon's nurse.  If you have other questions about your diagnosis, plan, or surgery, call the office and ask for your surgeon's nurse.

## 2015-12-21 ENCOUNTER — Other Ambulatory Visit: Payer: Self-pay | Admitting: Surgery

## 2015-12-21 DIAGNOSIS — N6091 Unspecified benign mammary dysplasia of right breast: Secondary | ICD-10-CM

## 2016-01-13 ENCOUNTER — Encounter (HOSPITAL_BASED_OUTPATIENT_CLINIC_OR_DEPARTMENT_OTHER): Payer: Self-pay | Admitting: *Deleted

## 2016-01-17 ENCOUNTER — Ambulatory Visit
Admission: RE | Admit: 2016-01-17 | Discharge: 2016-01-17 | Disposition: A | Discharge status: Home / Self Care | Payer: Medicare Other | Source: Ambulatory Visit | Attending: Surgery | Admitting: Surgery

## 2016-01-17 DIAGNOSIS — N6091 Unspecified benign mammary dysplasia of right breast: Secondary | ICD-10-CM

## 2016-01-18 ENCOUNTER — Ambulatory Visit
Admission: RE | Admit: 2016-01-18 | Discharge: 2016-01-18 | Disposition: A | Discharge status: Home / Self Care | Payer: Medicare Other | Source: Ambulatory Visit | Attending: Surgery | Admitting: Surgery

## 2016-01-18 ENCOUNTER — Encounter (HOSPITAL_BASED_OUTPATIENT_CLINIC_OR_DEPARTMENT_OTHER): Payer: Self-pay | Admitting: Certified Registered"

## 2016-01-18 ENCOUNTER — Encounter (HOSPITAL_BASED_OUTPATIENT_CLINIC_OR_DEPARTMENT_OTHER)
Admission: RE | Disposition: A | Discharge status: Home / Self Care | Payer: Self-pay | Source: Ambulatory Visit | Attending: Surgery

## 2016-01-18 ENCOUNTER — Ambulatory Visit (HOSPITAL_BASED_OUTPATIENT_CLINIC_OR_DEPARTMENT_OTHER): Payer: Medicare Other | Admitting: Certified Registered"

## 2016-01-18 ENCOUNTER — Ambulatory Visit (HOSPITAL_BASED_OUTPATIENT_CLINIC_OR_DEPARTMENT_OTHER)
Admission: RE | Admit: 2016-01-18 | Discharge: 2016-01-18 | Disposition: A | Discharge status: Home / Self Care | Payer: Medicare Other | Source: Ambulatory Visit | Attending: Surgery | Admitting: Surgery

## 2016-01-18 DIAGNOSIS — Z853 Personal history of malignant neoplasm of breast: Secondary | ICD-10-CM | POA: Insufficient documentation

## 2016-01-18 DIAGNOSIS — N6091 Unspecified benign mammary dysplasia of right breast: Secondary | ICD-10-CM | POA: Diagnosis present

## 2016-01-18 DIAGNOSIS — N6021 Fibroadenosis of right breast: Secondary | ICD-10-CM | POA: Insufficient documentation

## 2016-01-18 DIAGNOSIS — D241 Benign neoplasm of right breast: Secondary | ICD-10-CM | POA: Insufficient documentation

## 2016-01-18 DIAGNOSIS — N6489 Other specified disorders of breast: Secondary | ICD-10-CM | POA: Diagnosis not present

## 2016-01-18 DIAGNOSIS — Z79899 Other long term (current) drug therapy: Secondary | ICD-10-CM | POA: Insufficient documentation

## 2016-01-18 DIAGNOSIS — Z7982 Long term (current) use of aspirin: Secondary | ICD-10-CM | POA: Insufficient documentation

## 2016-01-18 HISTORY — PX: BREAST LUMPECTOMY WITH RADIOACTIVE SEED LOCALIZATION: SHX6424

## 2016-01-18 HISTORY — DX: Malignant (primary) neoplasm, unspecified: C80.1

## 2016-01-18 HISTORY — DX: Nausea with vomiting, unspecified: Z98.890

## 2016-01-18 HISTORY — DX: Unspecified macular degeneration: H35.30

## 2016-01-18 HISTORY — DX: Nausea with vomiting, unspecified: R11.2

## 2016-01-18 SURGERY — BREAST LUMPECTOMY WITH RADIOACTIVE SEED LOCALIZATION
Anesthesia: General | Site: Breast | Laterality: Right

## 2016-01-18 MED ORDER — BUPIVACAINE-EPINEPHRINE (PF) 0.25% -1:200000 IJ SOLN
INTRAMUSCULAR | Status: AC
  Filled 2016-01-18: qty 150

## 2016-01-18 MED ORDER — MEPERIDINE HCL 25 MG/ML IJ SOLN: 6.2500 mg | INTRAMUSCULAR | Status: DC | PRN

## 2016-01-18 MED ORDER — EPHEDRINE SULFATE 50 MG/ML IJ SOLN
INTRAMUSCULAR | Status: DC | PRN
  Administered 2016-01-18 (×2): 10 mg via INTRAVENOUS

## 2016-01-18 MED ORDER — ONDANSETRON HCL 4 MG/2ML IJ SOLN
INTRAMUSCULAR | Status: DC | PRN
  Administered 2016-01-18: 4 mg via INTRAVENOUS

## 2016-01-18 MED ORDER — PROMETHAZINE HCL 25 MG/ML IJ SOLN
INTRAMUSCULAR | Status: AC
  Filled 2016-01-18: qty 1

## 2016-01-18 MED ORDER — GABAPENTIN 300 MG PO CAPS
ORAL_CAPSULE | ORAL | Status: AC
  Filled 2016-01-18: qty 1

## 2016-01-18 MED ORDER — ACETAMINOPHEN 500 MG PO TABS
1000.0000 mg | ORAL_TABLET | ORAL | Status: AC
  Administered 2016-01-18: 1000 mg via ORAL

## 2016-01-18 MED ORDER — FENTANYL CITRATE (PF) 100 MCG/2ML IJ SOLN
50.0000 ug | INTRAMUSCULAR | Status: AC | PRN
  Administered 2016-01-18: 25 ug via INTRAVENOUS
  Administered 2016-01-18: 50 ug via INTRAVENOUS
  Administered 2016-01-18: 25 ug via INTRAVENOUS

## 2016-01-18 MED ORDER — SCOPOLAMINE 1 MG/3DAYS TD PT72
1.0000 | MEDICATED_PATCH | Freq: Once | TRANSDERMAL | Status: DC | PRN
Start: 2016-01-18 — End: 2016-01-18

## 2016-01-18 MED ORDER — PROMETHAZINE HCL 25 MG/ML IJ SOLN
6.2500 mg | Freq: Once | INTRAMUSCULAR | Status: AC | PRN
  Administered 2016-01-18: 6.25 mg via INTRAVENOUS

## 2016-01-18 MED ORDER — FENTANYL CITRATE (PF) 100 MCG/2ML IJ SOLN: 25.0000 ug | INTRAMUSCULAR | Status: DC | PRN

## 2016-01-18 MED ORDER — DEXTROSE 5 % IV SOLN
3.0000 g | INTRAVENOUS | Status: AC
  Administered 2016-01-18: 2 g via INTRAVENOUS

## 2016-01-18 MED ORDER — FENTANYL CITRATE (PF) 100 MCG/2ML IJ SOLN
INTRAMUSCULAR | Status: AC
  Filled 2016-01-18: qty 2

## 2016-01-18 MED ORDER — METOCLOPRAMIDE HCL 5 MG/ML IJ SOLN: 10.0000 mg | Freq: Once | INTRAMUSCULAR | Status: DC | PRN

## 2016-01-18 MED ORDER — CEFAZOLIN SODIUM-DEXTROSE 2-4 GM/100ML-% IV SOLN
INTRAVENOUS | Status: AC
  Filled 2016-01-18: qty 100

## 2016-01-18 MED ORDER — DEXAMETHASONE SODIUM PHOSPHATE 10 MG/ML IJ SOLN
INTRAMUSCULAR | Status: AC
  Filled 2016-01-18: qty 1

## 2016-01-18 MED ORDER — LIDOCAINE HCL (PF) 1 % IJ SOLN
INTRAMUSCULAR | Status: AC
  Filled 2016-01-18: qty 30

## 2016-01-18 MED ORDER — PROPOFOL 10 MG/ML IV BOLUS
INTRAVENOUS | Status: DC | PRN
  Administered 2016-01-18: 200 mg via INTRAVENOUS

## 2016-01-18 MED ORDER — BUPIVACAINE-EPINEPHRINE (PF) 0.25% -1:200000 IJ SOLN
INTRAMUSCULAR | Status: DC | PRN
  Administered 2016-01-18: 17 mL

## 2016-01-18 MED ORDER — HYDROCODONE-ACETAMINOPHEN 5-325 MG PO TABS: 1.0000 | ORAL_TABLET | Freq: Four times a day (QID) | ORAL | 0 refills | Status: DC | PRN

## 2016-01-18 MED ORDER — CHLORHEXIDINE GLUCONATE CLOTH 2 % EX PADS: 6.0000 | MEDICATED_PAD | Freq: Once | CUTANEOUS | Status: DC

## 2016-01-18 MED ORDER — SCOPOLAMINE 1 MG/3DAYS TD PT72
MEDICATED_PATCH | TRANSDERMAL | Status: AC
  Filled 2016-01-18: qty 1

## 2016-01-18 MED ORDER — DEXAMETHASONE SODIUM PHOSPHATE 4 MG/ML IJ SOLN
INTRAMUSCULAR | Status: DC | PRN
  Administered 2016-01-18: 10 mg via INTRAVENOUS

## 2016-01-18 MED ORDER — ACETAMINOPHEN 500 MG PO TABS
ORAL_TABLET | ORAL | Status: AC
  Filled 2016-01-18: qty 2

## 2016-01-18 MED ORDER — LIDOCAINE 2% (20 MG/ML) 5 ML SYRINGE
INTRAMUSCULAR | Status: DC | PRN
  Administered 2016-01-18: 60 mg via INTRAVENOUS

## 2016-01-18 MED ORDER — MIDAZOLAM HCL 2 MG/2ML IJ SOLN
INTRAMUSCULAR | Status: AC
  Filled 2016-01-18: qty 2

## 2016-01-18 MED ORDER — MIDAZOLAM HCL 2 MG/2ML IJ SOLN
1.0000 mg | INTRAMUSCULAR | Status: DC | PRN
  Administered 2016-01-18: 2 mg via INTRAVENOUS

## 2016-01-18 MED ORDER — LIDOCAINE 2% (20 MG/ML) 5 ML SYRINGE
INTRAMUSCULAR | Status: AC
  Filled 2016-01-18: qty 5

## 2016-01-18 MED ORDER — GLYCOPYRROLATE 0.2 MG/ML IJ SOLN
0.2000 mg | Freq: Once | INTRAMUSCULAR | Status: AC | PRN
  Administered 2016-01-18: 0.1 mg via INTRAVENOUS

## 2016-01-18 MED ORDER — GABAPENTIN 300 MG PO CAPS
300.0000 mg | ORAL_CAPSULE | ORAL | Status: AC
  Administered 2016-01-18: 300 mg via ORAL

## 2016-01-18 MED ORDER — CELECOXIB 200 MG PO CAPS
ORAL_CAPSULE | ORAL | Status: AC
  Filled 2016-01-18: qty 2

## 2016-01-18 MED ORDER — PROPOFOL 500 MG/50ML IV EMUL
INTRAVENOUS | Status: AC
  Filled 2016-01-18: qty 50

## 2016-01-18 MED ORDER — CELECOXIB 400 MG PO CAPS
400.0000 mg | ORAL_CAPSULE | ORAL | Status: AC
  Administered 2016-01-18: 400 mg via ORAL

## 2016-01-18 MED ORDER — LACTATED RINGERS IV SOLN
INTRAVENOUS | Status: DC
  Administered 2016-01-18 (×3): via INTRAVENOUS

## 2016-01-18 MED ORDER — ONDANSETRON HCL 4 MG/2ML IJ SOLN
INTRAMUSCULAR | Status: AC
  Filled 2016-01-18: qty 2

## 2016-01-18 SURGICAL SUPPLY — 45 items
APPLIER CLIP 9.375 MED OPEN (MISCELLANEOUS) ×2
APR CLP MED 9.3 20 MLT OPN (MISCELLANEOUS) ×1
BINDER BREAST LRG (GAUZE/BANDAGES/DRESSINGS) ×1 IMPLANT
BINDER BREAST XLRG (GAUZE/BANDAGES/DRESSINGS) IMPLANT
BLADE SURG 15 STRL LF DISP TIS (BLADE) ×1 IMPLANT
BLADE SURG 15 STRL SS (BLADE) ×2
CANISTER SUCT 1200ML W/VALVE (MISCELLANEOUS) IMPLANT
CHLORAPREP W/TINT 26ML (MISCELLANEOUS) ×2 IMPLANT
CLIP APPLIE 9.375 MED OPEN (MISCELLANEOUS) IMPLANT
COVER BACK TABLE 60X90IN (DRAPES) ×2 IMPLANT
COVER MAYO STAND STRL (DRAPES) ×2 IMPLANT
COVER PROBE W GEL 5X96 (DRAPES) ×2 IMPLANT
DECANTER SPIKE VIAL GLASS SM (MISCELLANEOUS) IMPLANT
DEVICE DUBIN W/COMP PLATE 8390 (MISCELLANEOUS) ×2 IMPLANT
DRAPE LAPAROTOMY 100X72 PEDS (DRAPES) ×2 IMPLANT
DRAPE UTILITY XL STRL (DRAPES) ×2 IMPLANT
ELECT COATED BLADE 2.86 ST (ELECTRODE) ×2 IMPLANT
ELECT REM PT RETURN 9FT ADLT (ELECTROSURGICAL) ×2
ELECTRODE REM PT RTRN 9FT ADLT (ELECTROSURGICAL) ×1 IMPLANT
GLOVE BIOGEL PI IND STRL 7.0 (GLOVE) IMPLANT
GLOVE BIOGEL PI IND STRL 8 (GLOVE) ×1 IMPLANT
GLOVE BIOGEL PI INDICATOR 7.0 (GLOVE) ×2
GLOVE BIOGEL PI INDICATOR 8 (GLOVE) ×1
GLOVE ECLIPSE 8.0 STRL XLNG CF (GLOVE) ×3 IMPLANT
GLOVE SURG SS PI 6.5 STRL IVOR (GLOVE) ×1 IMPLANT
GOWN STRL REUS W/ TWL LRG LVL3 (GOWN DISPOSABLE) ×2 IMPLANT
GOWN STRL REUS W/TWL LRG LVL3 (GOWN DISPOSABLE) ×4
HEMOSTAT SNOW SURGICEL 2X4 (HEMOSTASIS) IMPLANT
KIT MARKER MARGIN INK (KITS) ×2 IMPLANT
LIQUID BAND (GAUZE/BANDAGES/DRESSINGS) ×2 IMPLANT
NDL HYPO 25X1 1.5 SAFETY (NEEDLE) ×1 IMPLANT
NEEDLE HYPO 25X1 1.5 SAFETY (NEEDLE) ×2 IMPLANT
NS IRRIG 1000ML POUR BTL (IV SOLUTION) ×2 IMPLANT
PACK BASIN DAY SURGERY FS (CUSTOM PROCEDURE TRAY) ×2 IMPLANT
PENCIL BUTTON HOLSTER BLD 10FT (ELECTRODE) ×2 IMPLANT
SLEEVE SCD COMPRESS KNEE MED (MISCELLANEOUS) ×2 IMPLANT
SPONGE LAP 4X18 X RAY DECT (DISPOSABLE) ×2 IMPLANT
SUT MNCRL AB 4-0 PS2 18 (SUTURE) ×2 IMPLANT
SUT SILK 2 0 SH (SUTURE) IMPLANT
SUT VICRYL 3-0 CR8 SH (SUTURE) ×2 IMPLANT
SYR CONTROL 10ML LL (SYRINGE) ×2 IMPLANT
TOWEL OR 17X24 6PK STRL BLUE (TOWEL DISPOSABLE) ×2 IMPLANT
TOWEL OR NON WOVEN STRL DISP B (DISPOSABLE) ×2 IMPLANT
TUBE CONNECTING 20X1/4 (TUBING) IMPLANT
YANKAUER SUCT BULB TIP NO VENT (SUCTIONS) IMPLANT

## 2016-01-18 NOTE — Op Note (Signed)
Preoperative diagnosis: Right breast atypical ductal hyperplasia   Postoperative diagnosis: Same   Procedure: Left breast seed localized lumpectomy  Surgeon: Erroll Luna M.D.  Anesthesia: Gen. With 0.25% Sensorcaine local with epinephrine   EBL: 20 cc  Specimen: Right breast tissue with clip and radioactive seed in the specimen. Verified with neoprobe and radiographic image showing both seed and clip in specimen  Indications for procedure: The patient presents for right  breast seed  lumpectomy after core biopsy showed ADH. Discussed the rationale for considering excision. Small risk of malignancy associated with THIS  lesion after core biopsy. Discussed observation. Discussed wire localization/ seed localization. Patient desired excision of left breast ADH.The procedure has been discussed with the patient. Alternatives to surgery have been discussed with the patient.  Risks of surgery include bleeding,  Infection,  Seroma formation, death,  and the need for further surgery.   The patient understands and wishes to proceed.   Description of procedure: Patient underwent seed placement as an outpatient. Patient presents today for right breast seed localized lumpectomy. Patient seen in  holding area. Questions are answered and neoprobe used to verify seed location. Patient taken back to the operating room and placed upon the OR table. After induction of general anesthesia, right breast prepped and draped in a sterile fashion. Timeout was done to verify proper sizing procedure. Neoprobe used and hot spot identified and left breast upper-outer quadrant. This was marked with pen. Curvilinear incision made around the nipple. Dissection used with the help of a neoprobe around the tissue where the seed and clip were located. Tissue removed in its entirety with gross clear  margins.Marlis Edelson used and seen within specimen. Radiographs taken which show clip and seed  In specimen.hemostasis achieved and  cavity closed with 3-0 Vicryl and 4-0 Monocryl. Dermabond applied. All final counts found to be correct. Specimen transported to pathology. Patient awoke extubated taken to recovery in satisfactory condition.

## 2016-01-18 NOTE — Interval H&P Note (Signed)
History and Physical Interval Note:  01/18/2016 7:23 AM  [name] [name]  has presented today for surgery, with the diagnosis of RIGHT ATYPICAL LOBULAR HYPERPLASIA  The various methods of treatment have been discussed with the patient and family. After consideration of risks, benefits and other options for treatment, the patient has consented to  Procedure(s): RIGHT BREAST LUMPECTOMY WITH RADIOACTIVE SEED LOCALIZATION (Right) as a surgical intervention .  The patient's history has been reviewed, patient examined, no change in status, stable for surgery.  I have reviewed the patient's chart and labs.  Questions were answered to the patient's satisfaction.     Eben Choinski A.

## 2016-01-18 NOTE — Anesthesia Postprocedure Evaluation (Signed)
Anesthesia Post Note  Patient: [name] [name]  Procedure(s) Performed: Procedure(s) (LRB): RIGHT BREAST LUMPECTOMY WITH RADIOACTIVE SEED LOCALIZATION (Right)  Patient location during evaluation: PACU Anesthesia Type: General and Regional Level of consciousness: awake and alert Pain management: pain level controlled Vital Signs Assessment: post-procedure vital signs reviewed and stable Respiratory status: spontaneous breathing, nonlabored ventilation, respiratory function stable and patient connected to nasal cannula oxygen Cardiovascular status: blood pressure returned to baseline and stable Postop Assessment: no signs of nausea or vomiting Anesthetic complications: no    Last Vitals:  Vitals:   01/18/16 1030 01/18/16 1153  BP:  119/77  Pulse: 72 66  Resp:  16  Temp:  36.6 C    Last Pain:  Vitals:   01/18/16 1153  TempSrc:   PainSc: 0-No pain                 [name]

## 2016-01-18 NOTE — Discharge Instructions (Signed)
[facility] °Office Phone Number [phone] ° °BREAST BIOPSY/ PARTIAL MASTECTOMY: POST OP INSTRUCTIONS ° °Always review your discharge instruction sheet given to you by the facility where your surgery was performed. ° °IF YOU HAVE DISABILITY OR FAMILY LEAVE FORMS, YOU MUST BRING THEM TO THE OFFICE FOR PROCESSING.  DO NOT GIVE THEM TO YOUR DOCTOR. ° °1. A prescription for pain medication may be given to you upon discharge.  Take your pain medication as prescribed, if needed.  If narcotic pain medicine is not needed, then you may take acetaminophen (Tylenol) or ibuprofen (Advil) as needed. °2. Take your usually prescribed medications unless otherwise directed °3. If you need a refill on your pain medication, please contact your pharmacy.  They will contact our office to request authorization.  Prescriptions will not be filled after 5pm or on week-ends. °4. You should eat very light the first 24 hours after surgery, such as soup, crackers, pudding, etc.  Resume your normal diet the day after surgery. °5. Most patients will experience some swelling and bruising in the breast.  Ice packs and a good support bra will help.  Swelling and bruising can take several days to resolve.  °6. It is common to experience some constipation if taking pain medication after surgery.  Increasing fluid intake and taking a stool softener will usually help or prevent this problem from occurring.  A mild laxative (Milk of Magnesia or Miralax) should be taken according to package directions if there are no bowel movements after 48 hours. °7. Unless discharge instructions indicate otherwise, you may remove your bandages 24-48 hours after surgery, and you may shower at that time.  You may have steri-strips (small skin tapes) in place directly over the incision.  These strips should be left on the skin for 7-10 days.  If your surgeon used skin glue on the incision, you may shower in 24 hours.  The glue will flake off over the  next 2-3 weeks.  Any sutures or staples will be removed at the office during your follow-up visit. °8. ACTIVITIES:  You may resume regular daily activities (gradually increasing) beginning the next day.  Wearing a good support bra or sports bra minimizes pain and swelling.  You may have sexual intercourse when it is comfortable. °a. You may drive when you no longer are taking prescription pain medication, you can comfortably wear a seatbelt, and you can safely maneuver your car and apply brakes. °b. RETURN TO WORK:  ______________________________________________________________________________________ °9. You should see your doctor in the office for a follow-up appointment approximately two weeks after your surgery.  Your doctor’s nurse will typically make your follow-up appointment when she calls you with your pathology report.  Expect your pathology report 2-3 business days after your surgery.  You may call to check if you do not hear from us after three days. °10. OTHER INSTRUCTIONS: _______________________________________________________________________________________________ _____________________________________________________________________________________________________________________________________ °_____________________________________________________________________________________________________________________________________ °_____________________________________________________________________________________________________________________________________ ° °WHEN TO CALL YOUR DOCTOR: °1. Fever over 101.0 °2. Nausea and/or vomiting. °3. Extreme swelling or bruising. °4. Continued bleeding from incision. °5. Increased pain, redness, or drainage from the incision. ° °The clinic staff is available to answer your questions during regular business hours.  Please don’t hesitate to call and ask to speak to one of the nurses for clinical concerns.  If you have a medical emergency, go to the nearest  emergency room or call 911.  A surgeon from [facility] is always on call at the hospital. ° °For further questions, please visit [email or web address]  ° ° ° °  Post Anesthesia Home Care Instructions ° °Activity: °Get plenty of rest for the remainder of the day. A responsible adult should stay with you for 24 hours following the procedure.  °For the next 24 hours, DO NOT: °-Drive a car °-Operate machinery °-Drink alcoholic beverages °-Take any medication unless instructed by your physician °-Make any legal decisions or sign important papers. ° °Meals: °Start with liquid foods such as gelatin or soup. Progress to regular foods as tolerated. Avoid greasy, spicy, heavy foods. If nausea and/or vomiting occur, drink only clear liquids until the nausea and/or vomiting subsides. Call your physician if vomiting continues. ° °Special Instructions/Symptoms: °Your throat may feel dry or sore from the anesthesia or the breathing tube placed in your throat during surgery. If this causes discomfort, gargle with warm salt water. The discomfort should disappear within 24 hours. ° °If you had a scopolamine patch placed behind your ear for the management of post- operative nausea and/or vomiting: ° °1. The medication in the patch is effective for 72 hours, after which it should be removed.  Wrap patch in a tissue and discard in the trash. Wash hands thoroughly with soap and water. °2. You may remove the patch earlier than 72 hours if you experience unpleasant side effects which may include dry mouth, dizziness or visual disturbances. °3. Avoid touching the patch. Wash your hands with soap and water after contact with the patch. °  ° °

## 2016-01-18 NOTE — Anesthesia Procedure Notes (Signed)
Procedure Name: LMA Insertion Date/Time: 01/18/2016 7:32 AM Performed by: Jsaon Yoo D Pre-anesthesia Checklist: Patient identified, Emergency Drugs available, Suction available and Patient being monitored Patient Re-evaluated:Patient Re-evaluated prior to inductionOxygen Delivery Method: Circle system utilized Preoxygenation: Pre-oxygenation with 100% oxygen Intubation Type: IV induction Ventilation: Mask ventilation without difficulty LMA: LMA inserted LMA Size: 4.0 Number of attempts: 1 Airway Equipment and Method: Bite block Placement Confirmation: positive ETCO2 Tube secured with: Tape Dental Injury: Teeth and Oropharynx as per pre-operative assessment

## 2016-01-18 NOTE — Anesthesia Preprocedure Evaluation (Addendum)
Anesthesia Evaluation  Patient identified by MRN, date of birth, ID band Patient awake    Reviewed: Allergy & Precautions, NPO status , Patient's Chart, lab work & pertinent test results  History of Anesthesia Complications (+) PONV  Airway Mallampati: II  TM Distance: >3 FB Neck ROM: Full    Dental no notable dental hx.    Pulmonary neg pulmonary ROS,    Pulmonary exam normal breath sounds clear to auscultation       Cardiovascular negative cardio ROS Normal cardiovascular exam Rhythm:Regular Rate:Normal     Neuro/Psych negative neurological ROS  negative psych ROS   GI/Hepatic negative GI ROS, Neg liver ROS,   Endo/Other  negative endocrine ROS  Renal/GU negative Renal ROS  negative genitourinary   Musculoskeletal negative musculoskeletal ROS (+)   Abdominal   Peds negative pediatric ROS (+)  Hematology negative hematology ROS (+)   Anesthesia Other Findings   Reproductive/Obstetrics negative OB ROS                             Anesthesia Physical Anesthesia Plan  ASA: II  Anesthesia Plan: General   Post-op Pain Management:    Induction: Intravenous  Airway Management Planned: LMA  Additional Equipment:   Intra-op Plan:   Post-operative Plan: Extubation in OR  Informed Consent: I have reviewed the patients History and Physical, chart, labs and discussed the procedure including the risks, benefits and alternatives for the proposed anesthesia with the patient or authorized representative who has indicated his/her understanding and acceptance.   Dental advisory given  Plan Discussed with: CRNA  Anesthesia Plan Comments:         Anesthesia Quick Evaluation  

## 2016-01-18 NOTE — H&P (Signed)
Pre-op/Pre-procedure Orders Open     [facility]  Erroll Luna, MD  General Surgery   Atypical ductal hyperplasia of right breast  Dx   Additional Documentation   Encounter Info:     All Notes   H&P by Erroll Luna, MD a 12:18 PM   Author: Erroll Luna, MD Author Type: Physician Filed:   PM  Note Status: Signed Cosign: Cosign Not Required Encounter Date:   Editor: Erroll Luna, MD (Physician)    [name] [name]  Location: [facility] Patient #: [id number] DOB: 1951/03/21 Single / Language: [name] [name] / Race: White Female  History of Present Illness [name] Moores A. Lyrik Dockstader MD; 2:16 PM) Patient words: [facility]   Patient presents for discussion of options after abnormal mammogram revealed right breast microcalcifications this year. This was a screening mammogram. Biopsy showed atypical lobular hyperplasia. He areas about 2-3 cm in maximal diameter. Patient denies any history of breast mass, breast pain or nipple discharge. Has a history of left breast cancer from [place] and underwent mastectomy and reconstruction. She has no other complaints. She had her implant exchange in 2009.              Breast, right, needle core biopsy, upper outer quadrant ATYPICAL LOBULAR HYPERPLASIA MICROSCOPIC INTRADUCTAL PAPILLOMA COMPLEX SCLEROSING LESION WITH USUAL DUCTAL HYPERPLASIA FIBROCYSTIC CHANGES WITH CALCIFICATIONS Microscopic Comment Immunohistochemistry stains for ck5/6 has been performed, supporting.  The patient is a 65 year old female.   Other Problems [name] [name], CMA; [id number] 10:53 AM) Arthritis Breast Cancer Gastroesophageal Reflux Disease General anesthesia - complications Hypercholesterolemia Lump In Breast Migraine Headache  Past Surgical History [name] [name], [facility]) Breast Biopsy Bilateral. Breast Reconstruction Left. Hysterectomy (not due to cancer) - Complete Mastectomy Left.  Diagnostic  Studies History [name] [name], [place]; 12/06/2015 10:53 AM) Colonoscopy >10 years ago Mammogram within last year Pap Smear 1-5 years ago  Allergies [name] [name], [place];  No Known Drug Allergies 12/06/2015  Medication History [name] [name], CMA;  Glucosamine 1500 Complex (Oral) Active. Multi Vitamin Daily (Oral) Active. Nasacort (55MCG/ACT Aerosol Soln, Nasal) Active. NexIUM (10MG  Packet, Oral) Active. Aspirin (81MG  Tablet, Oral) Active. PreserVision AREDS 2 (Oral) Active. Omega-3 Fish Oil/Vitamin D3 (1000-1000MG -UNIT Capsule, Oral) Active. Medications Reconciled  Social History [name] [name], [place];  Alcohol use Occasional alcohol use. Caffeine use Carbonated beverages, Coffee, Tea. No drug use Tobacco use Never smoker.  Family History [name] [name], CMA;  AM) Arthritis Mother. Breast Cancer Family Members In General. Cancer Father, Mother.  Pregnancy / Birth History [name] [name], CMA; 10:53 AM) Age at menarche 55 years. Age of menopause 51-55 Contraceptive History Oral contraceptives. Gravida 1 Maternal age 66-35 Para 1 Regular periods     Review of Systems [name] [name] CMA; 10:53 AM) General Present- Fatigue. Not Present- Appetite Loss, Chills, Fever, Night Sweats, Weight Gain and Weight Loss. Skin Not Present- Change in Wart/Mole, Dryness, Hives, Jaundice, New Lesions, Non-Healing Wounds, Rash and Ulcer. HEENT Present- Seasonal Allergies and Sinus Pain. Not Present- Earache, Hearing Loss, Hoarseness, Nose Bleed, Oral Ulcers, Ringing in the Ears, Sore Throat, Visual Disturbances, Wears glasses/contact lenses and Yellow Eyes. Respiratory Not Present- Bloody sputum, Chronic Cough, Difficulty Breathing, Snoring and Wheezing. Breast Not Present- Breast Mass, Breast Pain, Nipple Discharge and Skin Changes. Cardiovascular Not Present- Chest Pain, Difficulty Breathing Lying Down, Leg Cramps, Palpitations,  Rapid Heart Rate, Shortness of Breath and Swelling of Extremities. Gastrointestinal Not Present- Abdominal Pain, Bloating, Bloody Stool, Change in Bowel Habits, Chronic diarrhea,  Constipation, Difficulty Swallowing, Excessive gas, Gets full quickly at meals, Hemorrhoids, Indigestion, Nausea, Rectal Pain and Vomiting. Female Genitourinary Not Present- Frequency, Nocturia, Painful Urination, Pelvic Pain and Urgency. Musculoskeletal Present- Joint Stiffness. Not Present- Back Pain, Joint Pain, Muscle Pain, Muscle Weakness and Swelling of Extremities. Neurological Not Present- Decreased Memory, Fainting, Headaches, Numbness, Seizures, Tingling, Tremor, Trouble walking and Weakness. Psychiatric Not Present- Anxiety, Bipolar, Change in Sleep Pattern, Depression, Fearful and Frequent crying. Endocrine Not Present- Cold Intolerance, Excessive Hunger, Hair Changes, Heat Intolerance, Hot flashes and New Diabetes. Hematology Not Present- Blood Thinners, Easy Bruising, Excessive bleeding, Gland problems, HIV and Persistent Infections.  Vitals [name] [name] CMA;  12/06/2015 10:52 AM Weight: 166.13 lb Height: 65in Body Surface Area: 1.83 m Body Mass Index: 27.64 kg/m  Temp.: 98.75F(Oral)  Pulse: 82 (Regular)  BP: 120/72 (Sitting, Left Arm, Standard)      Physical Exam ([name] Mineer A. Vyron Fronczak MD;  General Mental Status-Alert. General Appearance-Consistent with stated age. Hydration-Well hydrated. Voice-Normal.  Chest and Lung Exam Chest and lung exam reveals -quiet, even and easy respiratory effort with no use of accessory muscles and on auscultation, normal breath sounds, no adventitious sounds and normal vocal resonance. Inspection Chest Wall - Normal. Back - normal.  Breast Note: Left breast surgically absent. Scar noted for mastectomy and implant in place. No masses. Right breast shows bruising at the 11 to 12 o'clock position with a small hematoma. No signs  of infection. No masses or drainage noted.  Cardiovascular Cardiovascular examination reveals -normal heart sounds, regular rate and rhythm with no murmurs and normal pedal pulses bilaterally.  Neurologic Neurologic evaluation reveals -alert and oriented x 3 with no impairment of recent or remote memory. Mental Status-Normal.  Musculoskeletal Normal Exam - Left-Upper Extremity Strength Normal and Lower Extremity Strength Normal. Normal Exam - Right-Upper Extremity Strength Normal and Lower Extremity Strength Normal.  Lymphatic Head & Neck  General Head & Neck Lymphatics: Bilateral - Description - Normal. Axillary  General Axillary Region: Bilateral - Description - Normal. Tenderness - Non Tender.    Assessment & Plan ([name] Glore A. Aleayah Chico MD; 12:18 PM)  ATYPICAL LOBULAR HYPERPLASIA ([facility]) OF RIGHT BREAST (N60.91) Impression: Risk of lumpectomy include bleeding, infection, seroma, more surgery, use of seed/wire, wound care, cosmetic deformity and the need for other treatments, death , blood clots, death. Pt agrees to proceed.  Discuss surgical excision versus observation. The pros and cons of each discussed. The risk of malignancy discussed in this circumstance of being anywhere from 3-10% on final pathology. Given her previous breast cancer history she has opted to undergo right breast seemed localized lumpectomy.  Current Plans The anatomy and the physiology was discussed. The pathophysiology and natural history of the disease was discussed. Options were discussed and recommendations were made. Technique, risks, benefits, & alternatives were discussed. Risks such as stroke, heart attack, bleeding, indection, death, and other risks discussed. Questions answered. The patient agrees to proceed. You are being scheduled for surgery - Our schedulers will call you.  You should hear from our office's scheduling department within 5 working days about the location, date,  and time of surgery. We try to make accommodations for patient's preferences in scheduling surgery, but sometimes the OR schedule or the surgeon's schedule prevents [place] from making those accommodations.  If you have not heard from our office [phone]) in 5 working days, call the office and ask for your surgeon's nurse.  If you have other questions about your diagnosis, plan, or surgery, call the office  and ask for your surgeon's nurse.  Pt Education - [facility] Free Text Education/Instructions: discussed with patient and provided information. You are being scheduled for surgery - Our schedulers will call you.  You should hear from our office's scheduling department within 5 working days about the location, date, and time of surgery. We try to make accommodations for patient's preferences in scheduling surgery, but sometimes the OR schedule or the surgeon's schedule prevents [place] from making those accommodations.  If you have not heard from our office [phone]) in 5 working days, call the office and ask for your surgeon's nurse.  If you have other questions about your diagnosis, plan, or surgery, call the office and ask for your surgeon's nurse.

## 2016-01-18 NOTE — Transfer of Care (Signed)
Immediate Anesthesia Transfer of Care Note  Patient: [name] [name]  Procedure(s) Performed: Procedure(s): RIGHT BREAST LUMPECTOMY WITH RADIOACTIVE SEED LOCALIZATION (Right)  Patient Location: PACU  Anesthesia Type:General  Level of Consciousness: awake and patient cooperative  Airway & Oxygen Therapy: Patient Spontanous Breathing and Patient connected to face mask oxygen  Post-op Assessment: Report given to RN and Post -op Vital signs reviewed and stable  Post vital signs: Reviewed and stable  Last Vitals:  Vitals:   01/18/16 0658  BP: 129/72  Pulse: 84  Resp: 18  Temp: 36.7 C    Last Pain:  Vitals:   01/18/16 0658  TempSrc: Oral      Patients Stated Pain Goal: 0 [phone]  Complications: No apparent anesthesia complications

## 2016-01-19 ENCOUNTER — Encounter (HOSPITAL_BASED_OUTPATIENT_CLINIC_OR_DEPARTMENT_OTHER): Payer: Self-pay | Admitting: Surgery

## 2016-02-04 ENCOUNTER — Other Ambulatory Visit: Payer: Self-pay | Admitting: Surgery

## 2016-02-04 DIAGNOSIS — N6091 Unspecified benign mammary dysplasia of right breast: Secondary | ICD-10-CM

## 2017-01-29 ENCOUNTER — Other Ambulatory Visit: Payer: Self-pay | Admitting: Family Medicine

## 2017-01-29 DIAGNOSIS — Z1231 Encounter for screening mammogram for malignant neoplasm of breast: Secondary | ICD-10-CM

## 2017-02-06 ENCOUNTER — Ambulatory Visit
Admission: RE | Admit: 2017-02-06 | Discharge: 2017-02-06 | Disposition: A | Discharge status: Home / Self Care | Payer: Medicare Other | Source: Ambulatory Visit | Attending: Family Medicine | Admitting: Family Medicine

## 2017-02-06 DIAGNOSIS — Z1231 Encounter for screening mammogram for malignant neoplasm of breast: Secondary | ICD-10-CM

## 2017-12-31 ENCOUNTER — Other Ambulatory Visit: Payer: Self-pay | Admitting: Family Medicine

## 2017-12-31 DIAGNOSIS — Z1231 Encounter for screening mammogram for malignant neoplasm of breast: Secondary | ICD-10-CM

## 2018-02-07 ENCOUNTER — Ambulatory Visit
Admission: RE | Admit: 2018-02-07 | Discharge: 2018-02-07 | Disposition: A | Discharge status: Home / Self Care | Payer: Medicare Other | Source: Ambulatory Visit | Attending: Family Medicine | Admitting: Family Medicine

## 2018-02-07 DIAGNOSIS — Z1231 Encounter for screening mammogram for malignant neoplasm of breast: Secondary | ICD-10-CM

## 2019-01-17 ENCOUNTER — Other Ambulatory Visit: Payer: Self-pay | Admitting: Family Medicine

## 2019-01-17 DIAGNOSIS — Z1231 Encounter for screening mammogram for malignant neoplasm of breast: Secondary | ICD-10-CM

## 2019-03-04 ENCOUNTER — Ambulatory Visit
Admission: RE | Admit: 2019-03-04 | Discharge: 2019-03-04 | Disposition: A | Discharge status: Home / Self Care | Payer: Medicare Other | Source: Ambulatory Visit | Attending: Family Medicine | Admitting: Family Medicine

## 2019-03-04 ENCOUNTER — Other Ambulatory Visit: Payer: Self-pay

## 2019-03-04 DIAGNOSIS — Z1231 Encounter for screening mammogram for malignant neoplasm of breast: Secondary | ICD-10-CM

## 2019-10-28 ENCOUNTER — Other Ambulatory Visit: Payer: Self-pay | Admitting: Family Medicine

## 2019-10-28 DIAGNOSIS — M810 Age-related osteoporosis without current pathological fracture: Secondary | ICD-10-CM

## 2019-12-11 ENCOUNTER — Other Ambulatory Visit: Payer: Self-pay | Admitting: Family Medicine

## 2019-12-11 DIAGNOSIS — Z1231 Encounter for screening mammogram for malignant neoplasm of breast: Secondary | ICD-10-CM

## 2020-01-13 DIAGNOSIS — H16223 Keratoconjunctivitis sicca, not specified as Sjogren's, bilateral: Secondary | ICD-10-CM | POA: Diagnosis not present

## 2020-02-16 ENCOUNTER — Other Ambulatory Visit: Payer: Self-pay

## 2020-02-16 ENCOUNTER — Ambulatory Visit
Admission: RE | Admit: 2020-02-16 | Discharge: 2020-02-16 | Disposition: A | Discharge status: Home / Self Care | Payer: Medicare PPO | Source: Ambulatory Visit | Attending: Family Medicine | Admitting: Family Medicine

## 2020-02-16 DIAGNOSIS — Z780 Asymptomatic menopausal state: Secondary | ICD-10-CM | POA: Diagnosis not present

## 2020-02-16 DIAGNOSIS — M8589 Other specified disorders of bone density and structure, multiple sites: Secondary | ICD-10-CM | POA: Diagnosis not present

## 2020-02-16 DIAGNOSIS — M810 Age-related osteoporosis without current pathological fracture: Secondary | ICD-10-CM

## 2020-03-04 ENCOUNTER — Ambulatory Visit: Payer: Medicare Other

## 2020-03-11 DIAGNOSIS — H109 Unspecified conjunctivitis: Secondary | ICD-10-CM | POA: Diagnosis not present

## 2020-04-06 ENCOUNTER — Other Ambulatory Visit: Payer: Self-pay

## 2020-04-06 ENCOUNTER — Ambulatory Visit
Admission: RE | Admit: 2020-04-06 | Discharge: 2020-04-06 | Disposition: A | Discharge status: Home / Self Care | Payer: Medicare PPO | Source: Ambulatory Visit | Attending: Family Medicine | Admitting: Family Medicine

## 2020-04-06 DIAGNOSIS — Z1231 Encounter for screening mammogram for malignant neoplasm of breast: Secondary | ICD-10-CM | POA: Diagnosis not present

## 2020-08-02 DIAGNOSIS — H16223 Keratoconjunctivitis sicca, not specified as Sjogren's, bilateral: Secondary | ICD-10-CM | POA: Diagnosis not present

## 2020-08-02 DIAGNOSIS — H35371 Puckering of macula, right eye: Secondary | ICD-10-CM | POA: Diagnosis not present

## 2020-08-02 DIAGNOSIS — H25013 Cortical age-related cataract, bilateral: Secondary | ICD-10-CM | POA: Diagnosis not present

## 2020-11-02 DIAGNOSIS — Z1211 Encounter for screening for malignant neoplasm of colon: Secondary | ICD-10-CM | POA: Diagnosis not present

## 2020-11-02 DIAGNOSIS — E782 Mixed hyperlipidemia: Secondary | ICD-10-CM | POA: Diagnosis not present

## 2020-11-02 DIAGNOSIS — E559 Vitamin D deficiency, unspecified: Secondary | ICD-10-CM | POA: Diagnosis not present

## 2020-11-08 DIAGNOSIS — Z6832 Body mass index (BMI) 32.0-32.9, adult: Secondary | ICD-10-CM | POA: Diagnosis not present

## 2020-11-08 DIAGNOSIS — Z1389 Encounter for screening for other disorder: Secondary | ICD-10-CM | POA: Diagnosis not present

## 2020-11-08 DIAGNOSIS — Z0000 Encounter for general adult medical examination without abnormal findings: Secondary | ICD-10-CM | POA: Diagnosis not present

## 2020-11-08 DIAGNOSIS — E669 Obesity, unspecified: Secondary | ICD-10-CM | POA: Diagnosis not present

## 2020-11-08 DIAGNOSIS — E782 Mixed hyperlipidemia: Secondary | ICD-10-CM | POA: Diagnosis not present

## 2020-11-08 DIAGNOSIS — M859 Disorder of bone density and structure, unspecified: Secondary | ICD-10-CM | POA: Diagnosis not present

## 2021-01-03 DIAGNOSIS — R059 Cough, unspecified: Secondary | ICD-10-CM | POA: Diagnosis not present

## 2021-01-25 ENCOUNTER — Other Ambulatory Visit: Payer: Self-pay | Admitting: Family Medicine

## 2021-01-25 DIAGNOSIS — Z1231 Encounter for screening mammogram for malignant neoplasm of breast: Secondary | ICD-10-CM

## 2021-03-08 DIAGNOSIS — R030 Elevated blood-pressure reading, without diagnosis of hypertension: Secondary | ICD-10-CM | POA: Diagnosis not present

## 2021-03-08 DIAGNOSIS — Z79899 Other long term (current) drug therapy: Secondary | ICD-10-CM | POA: Diagnosis not present

## 2021-03-08 DIAGNOSIS — E785 Hyperlipidemia, unspecified: Secondary | ICD-10-CM | POA: Diagnosis not present

## 2021-03-08 DIAGNOSIS — E669 Obesity, unspecified: Secondary | ICD-10-CM | POA: Diagnosis not present

## 2021-03-08 DIAGNOSIS — Z6831 Body mass index (BMI) 31.0-31.9, adult: Secondary | ICD-10-CM | POA: Diagnosis not present

## 2021-03-08 DIAGNOSIS — Z809 Family history of malignant neoplasm, unspecified: Secondary | ICD-10-CM | POA: Diagnosis not present

## 2021-03-08 DIAGNOSIS — K219 Gastro-esophageal reflux disease without esophagitis: Secondary | ICD-10-CM | POA: Diagnosis not present

## 2021-03-08 DIAGNOSIS — J302 Other seasonal allergic rhinitis: Secondary | ICD-10-CM | POA: Diagnosis not present

## 2021-03-08 DIAGNOSIS — M1990 Unspecified osteoarthritis, unspecified site: Secondary | ICD-10-CM | POA: Diagnosis not present

## 2021-04-08 ENCOUNTER — Other Ambulatory Visit: Payer: Self-pay

## 2021-04-08 ENCOUNTER — Ambulatory Visit
Admission: RE | Admit: 2021-04-08 | Discharge: 2021-04-08 | Disposition: A | Discharge status: Home / Self Care | Payer: Medicare PPO | Source: Ambulatory Visit | Attending: Family Medicine | Admitting: Family Medicine

## 2021-04-08 DIAGNOSIS — Z1231 Encounter for screening mammogram for malignant neoplasm of breast: Secondary | ICD-10-CM | POA: Diagnosis not present

## 2021-04-12 ENCOUNTER — Other Ambulatory Visit: Payer: Self-pay | Admitting: Family Medicine

## 2021-04-12 DIAGNOSIS — R928 Other abnormal and inconclusive findings on diagnostic imaging of breast: Secondary | ICD-10-CM

## 2021-05-02 ENCOUNTER — Ambulatory Visit
Admission: RE | Admit: 2021-05-02 | Discharge: 2021-05-02 | Disposition: A | Discharge status: Home / Self Care | Payer: Medicare PPO | Source: Ambulatory Visit | Attending: Family Medicine | Admitting: Family Medicine

## 2021-05-02 DIAGNOSIS — Z853 Personal history of malignant neoplasm of breast: Secondary | ICD-10-CM | POA: Diagnosis not present

## 2021-05-02 DIAGNOSIS — R928 Other abnormal and inconclusive findings on diagnostic imaging of breast: Secondary | ICD-10-CM

## 2021-05-02 DIAGNOSIS — R922 Inconclusive mammogram: Secondary | ICD-10-CM | POA: Diagnosis not present

## 2021-05-10 ENCOUNTER — Other Ambulatory Visit: Payer: Medicare PPO

## 2021-10-25 ENCOUNTER — Inpatient Hospital Stay (HOSPITAL_COMMUNITY)
Admission: EM | Admit: 2021-10-25 | Discharge: 2021-10-30 | DRG: 378 | Disposition: A | Discharge status: Home / Self Care | Payer: Medicare PPO | Attending: Internal Medicine | Admitting: Internal Medicine

## 2021-10-25 ENCOUNTER — Other Ambulatory Visit: Payer: Self-pay

## 2021-10-25 ENCOUNTER — Encounter (HOSPITAL_COMMUNITY): Payer: Self-pay | Admitting: Emergency Medicine

## 2021-10-25 ENCOUNTER — Emergency Department (HOSPITAL_COMMUNITY): Payer: Medicare PPO

## 2021-10-25 DIAGNOSIS — K219 Gastro-esophageal reflux disease without esophagitis: Secondary | ICD-10-CM | POA: Diagnosis not present

## 2021-10-25 DIAGNOSIS — K5790 Diverticulosis of intestine, part unspecified, without perforation or abscess without bleeding: Secondary | ICD-10-CM | POA: Diagnosis not present

## 2021-10-25 DIAGNOSIS — Z853 Personal history of malignant neoplasm of breast: Secondary | ICD-10-CM

## 2021-10-25 DIAGNOSIS — E7800 Pure hypercholesterolemia, unspecified: Secondary | ICD-10-CM | POA: Diagnosis not present

## 2021-10-25 DIAGNOSIS — K5730 Diverticulosis of large intestine without perforation or abscess without bleeding: Secondary | ICD-10-CM | POA: Diagnosis not present

## 2021-10-25 DIAGNOSIS — I700 Atherosclerosis of aorta: Secondary | ICD-10-CM | POA: Diagnosis not present

## 2021-10-25 DIAGNOSIS — K5731 Diverticulosis of large intestine without perforation or abscess with bleeding: Principal | ICD-10-CM | POA: Diagnosis present

## 2021-10-25 DIAGNOSIS — I10 Essential (primary) hypertension: Secondary | ICD-10-CM | POA: Diagnosis not present

## 2021-10-25 DIAGNOSIS — K644 Residual hemorrhoidal skin tags: Secondary | ICD-10-CM | POA: Diagnosis present

## 2021-10-25 DIAGNOSIS — E669 Obesity, unspecified: Secondary | ICD-10-CM | POA: Diagnosis present

## 2021-10-25 DIAGNOSIS — K5791 Diverticulosis of intestine, part unspecified, without perforation or abscess with bleeding: Secondary | ICD-10-CM | POA: Diagnosis present

## 2021-10-25 DIAGNOSIS — Z6831 Body mass index (BMI) 31.0-31.9, adult: Secondary | ICD-10-CM

## 2021-10-25 DIAGNOSIS — K922 Gastrointestinal hemorrhage, unspecified: Secondary | ICD-10-CM | POA: Diagnosis not present

## 2021-10-25 DIAGNOSIS — D62 Acute posthemorrhagic anemia: Secondary | ICD-10-CM | POA: Diagnosis present

## 2021-10-25 DIAGNOSIS — K921 Melena: Secondary | ICD-10-CM | POA: Diagnosis not present

## 2021-10-25 DIAGNOSIS — Z823 Family history of stroke: Secondary | ICD-10-CM | POA: Diagnosis not present

## 2021-10-25 DIAGNOSIS — I951 Orthostatic hypotension: Secondary | ICD-10-CM | POA: Diagnosis present

## 2021-10-25 DIAGNOSIS — K449 Diaphragmatic hernia without obstruction or gangrene: Secondary | ICD-10-CM | POA: Diagnosis not present

## 2021-10-25 DIAGNOSIS — Z9012 Acquired absence of left breast and nipple: Secondary | ICD-10-CM | POA: Diagnosis not present

## 2021-10-25 DIAGNOSIS — Z800 Family history of malignant neoplasm of digestive organs: Secondary | ICD-10-CM | POA: Diagnosis not present

## 2021-10-25 DIAGNOSIS — R112 Nausea with vomiting, unspecified: Secondary | ICD-10-CM | POA: Diagnosis not present

## 2021-10-25 DIAGNOSIS — D500 Iron deficiency anemia secondary to blood loss (chronic): Secondary | ICD-10-CM | POA: Diagnosis not present

## 2021-10-25 DIAGNOSIS — K625 Hemorrhage of anus and rectum: Secondary | ICD-10-CM | POA: Diagnosis not present

## 2021-10-25 DIAGNOSIS — Z7982 Long term (current) use of aspirin: Secondary | ICD-10-CM

## 2021-10-25 DIAGNOSIS — R000 Tachycardia, unspecified: Secondary | ICD-10-CM | POA: Diagnosis not present

## 2021-10-25 DIAGNOSIS — M47816 Spondylosis without myelopathy or radiculopathy, lumbar region: Secondary | ICD-10-CM | POA: Diagnosis not present

## 2021-10-25 DIAGNOSIS — Z90710 Acquired absence of both cervix and uterus: Secondary | ICD-10-CM

## 2021-10-25 DIAGNOSIS — E785 Hyperlipidemia, unspecified: Secondary | ICD-10-CM | POA: Diagnosis present

## 2021-10-25 DIAGNOSIS — R58 Hemorrhage, not elsewhere classified: Secondary | ICD-10-CM | POA: Diagnosis not present

## 2021-10-25 HISTORY — DX: Gastrointestinal hemorrhage, unspecified: K92.2

## 2021-10-25 HISTORY — DX: Hyperlipidemia, unspecified: E78.5

## 2021-10-25 HISTORY — DX: Diverticulosis of intestine, part unspecified, without perforation or abscess with bleeding: K57.91

## 2021-10-25 HISTORY — DX: Gastro-esophageal reflux disease without esophagitis: K21.9

## 2021-10-25 HISTORY — DX: Atherosclerosis of aorta: I70.0

## 2021-10-25 LAB — POC OCCULT BLOOD, ED: Fecal Occult Bld: POSITIVE — AB

## 2021-10-25 LAB — COMPREHENSIVE METABOLIC PANEL
ALT: 16 U/L (ref 0–44)
AST: 17 U/L (ref 15–41)
Albumin: 3.8 g/dL (ref 3.5–5.0)
Alkaline Phosphatase: 67 U/L (ref 38–126)
Anion gap: 9 (ref 5–15)
BUN: 24 mg/dL — ABNORMAL HIGH (ref 8–23)
CO2: 21 mmol/L — ABNORMAL LOW (ref 22–32)
Calcium: 8.9 mg/dL (ref 8.9–10.3)
Chloride: 108 mmol/L (ref 98–111)
Creatinine, Ser: 0.7 mg/dL (ref 0.44–1.00)
GFR, Estimated: >60 mL/min (ref >60)
Glucose, Bld: 137 mg/dL — ABNORMAL HIGH (ref 70–99)
Potassium: 3.9 mmol/L (ref 3.5–5.1)
Sodium: 138 mmol/L (ref 135–145)
Total Bilirubin: 0.5 mg/dL (ref 0.3–1.2)
Total Protein: 6.7 g/dL (ref 6.5–8.1)

## 2021-10-25 LAB — BASIC METABOLIC PANEL
Anion gap: 7 (ref 5–15)
BUN: 14 mg/dL (ref 8–23)
CO2: 21 mmol/L — ABNORMAL LOW (ref 22–32)
Calcium: 8 mg/dL — ABNORMAL LOW (ref 8.9–10.3)
Chloride: 114 mmol/L — ABNORMAL HIGH (ref 98–111)
Creatinine, Ser: 0.59 mg/dL (ref 0.44–1.00)
GFR, Estimated: >60 mL/min (ref >60)
Glucose, Bld: 120 mg/dL — ABNORMAL HIGH (ref 70–99)
Potassium: 3.7 mmol/L (ref 3.5–5.1)
Sodium: 142 mmol/L (ref 135–145)

## 2021-10-25 LAB — CBC WITH DIFFERENTIAL/PLATELET
Abs Immature Granulocytes: 0.03 10*3/uL (ref 0.00–0.07)
Basophils Absolute: 0.1 10*3/uL (ref 0.0–0.1)
Basophils Relative: 1 %
Eosinophils Absolute: 0.3 10*3/uL (ref 0.0–0.5)
Eosinophils Relative: 4 %
HCT: 27.4 % — ABNORMAL LOW (ref 36.0–46.0)
Hemoglobin: 8.9 g/dL — ABNORMAL LOW (ref 12.0–15.0)
Immature Granulocytes: 0 %
Lymphocytes Relative: 22 %
Lymphs Abs: 2 10*3/uL (ref 0.7–4.0)
MCH: 29.8 pg (ref 26.0–34.0)
MCHC: 32.5 g/dL (ref 30.0–36.0)
MCV: 91.6 fL (ref 80.0–100.0)
Monocytes Absolute: 0.5 10*3/uL (ref 0.1–1.0)
Monocytes Relative: 5 %
Neutro Abs: 6.5 10*3/uL (ref 1.7–7.7)
Neutrophils Relative %: 68 %
Platelets: 357 10*3/uL (ref 150–400)
RBC: 2.99 MIL/uL — ABNORMAL LOW (ref 3.87–5.11)
RDW: 14.5 % (ref 11.5–15.5)
WBC: 9.5 10*3/uL (ref 4.0–10.5)
nRBC: 0 % (ref 0.0–0.2)

## 2021-10-25 LAB — I-STAT CHEM 8, ED
BUN: 22 mg/dL (ref 8–23)
Calcium, Ion: 1.17 mmol/L (ref 1.15–1.40)
Chloride: 104 mmol/L (ref 98–111)
Creatinine, Ser: 0.7 mg/dL (ref 0.44–1.00)
Glucose, Bld: 133 mg/dL — ABNORMAL HIGH (ref 70–99)
HCT: 29 % — ABNORMAL LOW (ref 36.0–46.0)
Hemoglobin: 9.9 g/dL — ABNORMAL LOW (ref 12.0–15.0)
Potassium: 4 mmol/L (ref 3.5–5.1)
Sodium: 138 mmol/L (ref 135–145)
TCO2: 21 mmol/L — ABNORMAL LOW (ref 22–32)

## 2021-10-25 LAB — HEMOGLOBIN AND HEMATOCRIT, BLOOD
HCT: 22.9 % — ABNORMAL LOW (ref 36.0–46.0)
Hemoglobin: 7.4 g/dL — ABNORMAL LOW (ref 12.0–15.0)

## 2021-10-25 LAB — LIPASE, BLOOD: Lipase: 40 U/L (ref 11–51)

## 2021-10-25 LAB — ABO/RH: ABO/RH(D): AB POS

## 2021-10-25 LAB — PREPARE RBC (CROSSMATCH)

## 2021-10-25 LAB — PROTIME-INR
INR: 1.1 (ref 0.8–1.2)
Prothrombin Time: 14 seconds (ref 11.4–15.2)

## 2021-10-25 LAB — APTT: aPTT: 27 seconds (ref 24–36)

## 2021-10-25 LAB — LACTIC ACID, PLASMA: Lactic Acid, Venous: 2.7 mmol/L (ref 0.5–1.9)

## 2021-10-25 MED ORDER — PANTOPRAZOLE 80MG IVPB - SIMPLE MED
80.0000 mg | Freq: Once | INTRAVENOUS | Status: AC
  Administered 2021-10-25: 80 mg via INTRAVENOUS
  Filled 2021-10-25: qty 80

## 2021-10-25 MED ORDER — ACETAMINOPHEN 650 MG RE SUPP: 650.0000 mg | Freq: Four times a day (QID) | RECTAL | Status: DC | PRN

## 2021-10-25 MED ORDER — ONDANSETRON HCL 4 MG PO TABS: 4.0000 mg | ORAL_TABLET | Freq: Four times a day (QID) | ORAL | Status: DC | PRN

## 2021-10-25 MED ORDER — ONDANSETRON HCL 4 MG/2ML IJ SOLN
4.0000 mg | Freq: Four times a day (QID) | INTRAMUSCULAR | Status: DC | PRN
  Administered 2021-10-27 – 2021-10-28 (×3): 4 mg via INTRAVENOUS
  Filled 2021-10-25 (×3): qty 2

## 2021-10-25 MED ORDER — SODIUM CHLORIDE 0.9 % IV BOLUS
1000.0000 mL | Freq: Once | INTRAVENOUS | Status: AC
  Administered 2021-10-25: 1000 mL via INTRAVENOUS

## 2021-10-25 MED ORDER — SODIUM CHLORIDE 0.9 % IV SOLN
INTRAVENOUS | Status: DC
Start: 2021-10-25 — End: 2021-10-27

## 2021-10-25 MED ORDER — ACETAMINOPHEN 325 MG PO TABS
650.0000 mg | ORAL_TABLET | Freq: Four times a day (QID) | ORAL | Status: DC | PRN
  Administered 2021-10-27: 650 mg via ORAL
  Filled 2021-10-25: qty 2

## 2021-10-25 MED ORDER — SODIUM CHLORIDE 0.9% IV SOLUTION: Freq: Once | INTRAVENOUS | Status: DC

## 2021-10-25 MED ORDER — IOHEXOL 350 MG/ML SOLN
100.0000 mL | Freq: Once | INTRAVENOUS | Status: AC | PRN
  Administered 2021-10-25: 100 mL via INTRAVENOUS

## 2021-10-25 MED ORDER — SODIUM CHLORIDE (PF) 0.9 % IJ SOLN
INTRAMUSCULAR | Status: AC
  Filled 2021-10-25: qty 50

## 2021-10-25 MED ORDER — PANTOPRAZOLE INFUSION (NEW) - SIMPLE MED
8.0000 mg/h | INTRAVENOUS | Status: DC
  Administered 2021-10-25 – 2021-10-27 (×4): 8 mg/h via INTRAVENOUS
  Filled 2021-10-25: qty 80
  Filled 2021-10-25: qty 100
  Filled 2021-10-25: qty 80
  Filled 2021-10-25: qty 100
  Filled 2021-10-25 (×2): qty 80

## 2021-10-25 NOTE — H&P (Signed)
History and Physical    Patient: [name] [name] JTT:[id number] DOB: 1950/08/19 DOA: 10/25/2021 DOS: the patient was seen and examined on 10/25/2021 PCP: [name], MD  Patient coming from: Home  Chief Complaint:  Chief Complaint  Patient presents with   Rectal Bleeding   HPI: [name] [name] is a 71 y.o. female with medical history significant of class I obesity, hyperlipidemia, history of breast cancer, mastectomy, macular degeneration, diverticulosis, GERD, hyperlipidemia, MVP who is coming to the emergency department due to having 3 episodes of profuse hematochezia on Monday morning which later subsided during the day, but she again had 3 other episodes this morning associated with lightheadedness so she decided to come to the emergency department.  She described having mild abdominal discomfort, but stated that he was not exactly pain.  No melena, nausea, emesis, diarrhea or constipation.  She eats a lot of different kinds of nuts almost daily. He denied fever, chills, rhinorrhea, sore throat, wheezing or hemoptysis.  No chest pain, palpitations, diaphoresis, PND, orthopnea or pitting edema of the lower extremities. No flank pain, dysuria, frequency or hematuria.  No polyuria, polydipsia, polyphagia or blurred vision.   ED course: Initial vital signs were temperature 97.9 F, pulse 93, respirations 16, BP 130/87 mmHg O2 sat 97% on room air.  She received 1000 mL of LR bolus and started on a Protonix infusion.  Lab work: CBC is her white count 9.5, hemoglobin 9.9 g/dL and platelets 357.  Normal PT, INR and PTT.  Lipase was 40 units/L.  CMP showed a CO2 of 21 mmol/L, glucose 137 and BUN 24 mg/dL.  The rest of the CMP was normal.  Imaging: CTA GI bleed with no evidence of acute abnormalities within the abdomen or pelvis.  There was diffuse colonic diverticulosis without evidence of acute diverticulitis.  Large hiatal hernia.  Aortic atherosclerosis.  Review of Systems: As mentioned in the  history of present illness. All other systems reviewed and are negative. Past Medical History:  Diagnosis Date   Cancer ([place])    Macular degeneration    PONV (postoperative nausea and vomiting)    Past Surgical History:  Procedure Laterality Date   ABDOMINAL HYSTERECTOMY     BREAST EXCISIONAL BIOPSY Right    BREAST LUMPECTOMY WITH RADIOACTIVE SEED LOCALIZATION Right 01/18/2016   Procedure: RIGHT BREAST LUMPECTOMY WITH RADIOACTIVE SEED LOCALIZATION;  Surgeon: Erroll Luna, MD;  Location: [facility];  Service: General;  Laterality: Right;   BREAST SURGERY     MASTECTOMY Left    TURBINATE REDUCTION     Social History:  reports that she has never smoked. She has never used smokeless tobacco. She reports that she does not drink alcohol and does not use drugs.  No Known Allergies  Family History  Problem Relation Age of Onset   Stomach cancer Father    Dementia Paternal Grandmother    Stroke Paternal Grandfather    BRCA 1/2 Neg Hx    Breast cancer Neg Hx     Prior to Admission medications   Medication Sig Start Date End Date Taking? Authorizing Provider  atorvastatin (LIPITOR) 10 MG tablet Take 10 mg by mouth daily. 08/09/21  Yes [provider]  esomeprazole (NEXIUM) 40 MG capsule Take 40 mg by mouth daily at 12 noon.   Yes [provider]  Multiple Vitamins-Minerals (EYE VITAMINS & MINERALS PO) Take 1 tablet by mouth daily.   Yes [provider]  Multiple Vitamins-Minerals (MULTIVITAMIN WITH MINERALS) tablet Take 1 tablet  by mouth daily.   Yes [provider]  HYDROcodone-acetaminophen (NORCO) 5-325 MG tablet Take 1-2 tablets by mouth every 6 (six) hours as needed for moderate pain. Patient not taking: Reported on 10/25/2021 01/18/16   Erroll Luna, MD    Physical Exam: Vitals:   10/25/21 1500 10/25/21 1530 10/25/21 1600 10/25/21 1630  BP: 131/60 116/65 134/73 (!) 158/77  Pulse: 99 99 99 97  Resp: '20 18 17 19  ' Temp:       TempSrc:      SpO2: 97% 96% 99% 98%  Weight:      Height:       Physical Exam Vitals reviewed.  Constitutional:      General: She is awake. She is not in acute distress.    Appearance: Normal appearance. She is obese.  HENT:     Head: Normocephalic.     Mouth/Throat:     Mouth: Mucous membranes are moist.  Eyes:     General: No scleral icterus.    Pupils: Pupils are equal, round, and reactive to light.  Neck:     Vascular: No JVD.  Cardiovascular:     Rate and Rhythm: Normal rate and regular rhythm.     Heart sounds: S1 normal and S2 normal.  Pulmonary:     Effort: Pulmonary effort is normal.     Breath sounds: Normal breath sounds. No wheezing, rhonchi or rales.  Abdominal:     General: Bowel sounds are normal. There is no distension.     Palpations: Abdomen is soft.     Tenderness: There is no abdominal tenderness. There is no right CVA tenderness, left CVA tenderness or guarding.  Musculoskeletal:     Cervical back: Neck supple.     Right lower leg: No edema.     Left lower leg: No edema.  Skin:    General: Skin is warm and dry.     Coloration: Skin is pale.  Neurological:     General: No focal deficit present.     Mental Status: She is alert and oriented to person, place, and time.  Psychiatric:        Mood and Affect: Mood normal.        Behavior: Behavior normal. Behavior is cooperative.    Data Reviewed:  Results are pending, will review when available.  Assessment and Plan: Principal Problem:   Acute lower GI bleeding In the setting of:   Diverticulosis of intestine with bleeding Associated with:   Acute blood loss anemia Admit to PCU/inpatient. Continue IV fluids. Continue pantoprazole infusion. Antiemetics as needed. Monitor hematocrit and hemoglobin. Transfused 2 units of PRBC.  Hello  Active Problems:   GERD (gastroesophageal reflux disease) On PPI infusion.    Hyperlipidemia   Aortic atherosclerosis ([place]) On atorvastatin at  home.       Advance Care Planning:   Code Status: Full Code .dgo  Consults:   Family Communication:   Severity of Illness: The appropriate patient status for this patient is INPATIENT. Inpatient status is judged to be reasonable and necessary in order to provide the required intensity of service to ensure the patient's safety. The patient's presenting symptoms, physical exam findings, and initial radiographic and laboratory data in the context of their chronic comorbidities is felt to place them at high risk for further clinical deterioration. Furthermore, it is not anticipated that the patient will be medically stable for discharge from the hospital within 2 midnights of admission.   * I certify that at  the point of admission it is my clinical judgment that the patient will require inpatient hospital care spanning beyond 2 midnights from the point of admission due to high intensity of service, high risk for further deterioration and high frequency of surveillance required.*  Author: Reubin Milan, MD 10/25/2021 5:05 PM  For on call review [email or web address].   This document was prepared using Dragon voice recognition software and may contain some unintended transcription errors.

## 2021-10-25 NOTE — ED Provider Notes (Signed)
[facility] DEPT Provider Note   CSN: [id number] Arrival date & time: 10/25/21  7035     History  Chief Complaint  Patient presents with   Rectal Bleeding    [name] [name] is a 71 y.o. female.  Patient with a history of hypercholesterolemia presenting by EMS with intermittent rectal bleeding for the past 30 hours.  She reports she woke up in the wee hours of yesterday morning with sensation of need to have a bowel movement.  She reports bright red blood in the commode with scattered brown stool.  Have been having diarrhea a couple days previously that was nonbloody.  States she felt poorly yesterday with dizziness and poor appetite but did not have any further episodes of bleeding until again this morning.  She had 2 more episodes this morning of bright red blood per rectum with feeling of lightheadedness.  No nausea vomiting, cough or fever.  She denies blood thinner use.  She says she has intermittent shortness of breath which has been an ongoing issue for several months sometimes exertional and sometimes not.  No chest pain.  Admits to frequent Tylenol use but no NSAID use.  Reports colonoscopy about 4 years ago was normal.  The history is provided by the patient and the EMS personnel. The history is limited by the condition of the patient.  Rectal Bleeding Associated symptoms: abdominal pain, dizziness and light-headedness   Associated symptoms: no fever and no vomiting       Home Medications Prior to Admission medications   Medication Sig Start Date End Date Taking? Authorizing Provider  aspirin 81 MG chewable tablet Chew by mouth daily.    [provider]  HYDROcodone-acetaminophen (NORCO) 5-325 MG tablet Take 1-2 tablets by mouth every 6 (six) hours as needed for moderate pain. 01/18/16   Erroll Luna, MD  Multiple Vitamins-Minerals (EYE VITAMINS & MINERALS PO) Take by mouth.    [provider]  Multiple Vitamins-Minerals  (MULTIVITAMIN WITH MINERALS) tablet Take 1 tablet by mouth daily.    [provider]  Triamcinolone Acetonide (NASACORT ALLERGY 24HR NA) Place into the nose.    [provider]      Allergies    Patient has no known allergies.    Review of Systems   Review of Systems  Constitutional:  Positive for fatigue. Negative for activity change, appetite change and fever.  HENT:  Negative for congestion.   Respiratory:  Negative for cough, chest tightness and shortness of breath.   Cardiovascular:  Negative for chest pain.  Gastrointestinal:  Positive for abdominal pain, blood in stool and hematochezia. Negative for nausea and vomiting.  Genitourinary:  Negative for dysuria.  Musculoskeletal:  Negative for arthralgias and myalgias.  Skin:  Negative for rash.  Neurological:  Positive for dizziness, weakness and light-headedness. Negative for headaches.   all other systems are negative except as noted in the HPI and PMH.   Physical Exam Updated Vital Signs BP 138/87 (BP Location: Right Arm)   Pulse (!) 103   Temp 97.9 F (36.6 C) (Oral)   Resp 16   Ht '5\' 5"'$  (1.651 m)   Wt 85.7 kg   SpO2 95%   BMI 31.45 kg/m  Physical Exam Vitals and nursing note reviewed.  Constitutional:      General: She is not in acute distress.    Appearance: She is well-developed.     Comments: Pale appearing, no distress  HENT:     Head: Normocephalic and  atraumatic.     Mouth/Throat:     Pharynx: No oropharyngeal exudate.  Eyes:     Conjunctiva/sclera: Conjunctivae normal.     Pupils: Pupils are equal, round, and reactive to light.  Neck:     Comments: No meningismus. Cardiovascular:     Rate and Rhythm: Normal rate and regular rhythm.     Heart sounds: Normal heart sounds. No murmur heard. Pulmonary:     Effort: Pulmonary effort is normal. No respiratory distress.     Breath sounds: Normal breath sounds.  Abdominal:     Palpations: Abdomen is soft.     Tenderness: There is  abdominal tenderness. There is no guarding or rebound.  Genitourinary:    Comments: External hemorrhoids with skin tags without evidence of recent bleeding.  Bright red blood on examining finger. Musculoskeletal:        General: No tenderness. Normal range of motion.     Cervical back: Normal range of motion and neck supple.  Skin:    General: Skin is warm.  Neurological:     Mental Status: She is alert and oriented to person, place, and time.     Cranial Nerves: No cranial nerve deficit.     Motor: No abnormal muscle tone.     Coordination: Coordination normal.     Comments:  5/5 strength throughout. CN 2-12 intact.Equal grip strength.   Psychiatric:        Behavior: Behavior normal.    ED Results / Procedures / Treatments   Labs (all labs ordered are listed, but only abnormal results are displayed) Labs Reviewed  COMPREHENSIVE METABOLIC PANEL - Abnormal; Notable for the following components:      Result Value   CO2 21 (*)    Glucose, Bld 137 (*)    BUN 24 (*)    All other components within normal limits  CBC WITH DIFFERENTIAL/PLATELET - Abnormal; Notable for the following components:   RBC 2.99 (*)    Hemoglobin 8.9 (*)    HCT 27.4 (*)    All other components within normal limits  LACTIC ACID, PLASMA - Abnormal; Notable for the following components:   Lactic Acid, Venous 2.7 (*)    All other components within normal limits  HEMOGLOBIN AND HEMATOCRIT, BLOOD - Abnormal; Notable for the following components:   Hemoglobin 7.4 (*)    HCT 22.9 (*)    All other components within normal limits  POC OCCULT BLOOD, ED - Abnormal; Notable for the following components:   Fecal Occult Bld POSITIVE (*)    All other components within normal limits  I-STAT CHEM 8, ED - Abnormal; Notable for the following components:   Glucose, Bld 133 (*)    TCO2 21 (*)    Hemoglobin 9.9 (*)    HCT 29.0 (*)    All other components within normal limits  PROTIME-INR  LIPASE, BLOOD  APTT  HEMOGLOBIN  AND HEMATOCRIT, BLOOD  BASIC METABOLIC PANEL  TYPE AND SCREEN  ABO/RH    EKG None  Radiology CT ANGIO GI BLEED  Result Date: 10/25/2021 CLINICAL DATA:  Bright red blood per rectum. EXAM: CTA ABDOMEN AND PELVIS WITHOUT AND WITH CONTRAST TECHNIQUE: Multidetector CT imaging of the abdomen and pelvis was performed using the standard protocol during bolus administration of intravenous contrast. Multiplanar reconstructed images and MIPs were obtained and reviewed to evaluate the vascular anatomy. RADIATION DOSE REDUCTION: This exam was performed according to the departmental dose-optimization program which includes automated exposure control, adjustment of the mA  and/or kV according to patient size and/or use of iterative reconstruction technique. CONTRAST:  12m OMNIPAQUE IOHEXOL 350 MG/ML SOLN COMPARISON:  None Available. FINDINGS: VASCULAR Aorta: Normal caliber aorta without aneurysm, dissection, vasculitis or significant stenosis. Celiac: Patent without evidence of aneurysm, dissection, vasculitis or significant stenosis. SMA: Patent without evidence of aneurysm, dissection, vasculitis or significant stenosis. Renals: Both renal arteries are patent without evidence of aneurysm, dissection, vasculitis, fibromuscular dysplasia or significant stenosis. IMA: Patent without evidence of aneurysm, dissection, vasculitis or significant stenosis. Inflow: Patent without evidence of aneurysm, dissection, vasculitis or significant stenosis. Proximal Outflow: Bilateral common femoral and visualized portions of the superficial and profunda femoral arteries are patent without evidence of aneurysm, dissection, vasculitis or significant stenosis. Veins: No obvious venous abnormality within the limitations of this arterial phase study. Review of the MIP images confirms the above findings. NON-VASCULAR Lower chest: Large hiatal hernia. Hepatobiliary: No focal liver abnormality is seen. No gallstones, gallbladder wall  thickening, or biliary dilatation. Pancreas: Unremarkable. No pancreatic ductal dilatation or surrounding inflammatory changes. Spleen: Normal in size without focal abnormality. Adrenals/Urinary Tract: Adrenal glands are unremarkable. Kidneys are without renal calculi, or hydronephrosis. 1 cm exophytic mass off of the upper pole of the left kidney, too small to be actually characterized. Bladder is unremarkable. Stomach/Bowel: Stomach is within normal limits. No evidence of appendicitis. No evidence of bowel wall thickening, distention, or inflammatory changes. Diffuse colonic diverticulosis without evidence of acute diverticulitis. Lymphatic: Aortic atherosclerosis. No enlarged abdominal or pelvic lymph nodes. Reproductive: Status post hysterectomy. No adnexal masses. Other: No abdominal wall hernia or abnormality. No abdominopelvic ascites. Musculoskeletal:  L4-L5 spondylosis. IMPRESSION: 1. No evidence of acute abnormalities within the abdomen or pelvis. 2. Diffuse colonic diverticulosis without evidence of acute diverticulitis. 3. Large hiatal hernia. Aortic Atherosclerosis (ICD10-I70.0). Electronically Signed   By: DFidela SalisburyM.D.   On: 10/25/2021 12:01    Procedures Procedures    Medications Ordered in ED Medications  sodium chloride 0.9 % bolus 1,000 mL (has no administration in time range)    And  0.9 %  sodium chloride infusion (has no administration in time range)  pantoprazole (PROTONIX) 80 mg /NS 100 mL IVPB (has no administration in time range)  pantoprozole (PROTONIX) 80 mg /NS 100 mL infusion (has no administration in time range)    ED Course/ Medical Decision Making/ A&P                           Medical Decision Making Amount and/or Complexity of Data Reviewed Labs: ordered. Decision-making details documented in ED Course. Radiology: ordered and independent interpretation performed. Decision-making details documented in ED Course. ECG/medicine tests: ordered and  independent interpretation performed. Decision-making details documented in ED Course.  Risk Prescription drug management. Decision regarding hospitalization.  2 days of intermittent rectal bleeding with abdominal cramping.  Vitals are stable, no distress, no blood thinner use.  She reports a negative colonoscopy about 4 years ago but no results available.  Hemoglobin is 8.9.  No comparison.  Hemoccult is positive  Orthostatics are positive.  Heart rate elevates to 120s with standing.  CT angiogram was obtained to evaluate for source of bleeding.  This is negative for acute area of GI bleeding.  Results reviewed and interpreted by me.  Suspect likely diverticular and/or hemorrhoidal bleeding.  Patient hemodynamically stable but does have orthostasis and elevated heart rate with symptomatic dizziness and shortness of breath with standing.  Given patient's orthostasis, tachycardia, uncertain  baseline hemoglobin and dizziness and lightheadedness with shortness of breath we will plan overnight observation for rectal bleeding.  Discussed with Dr. Olevia Bowens.  Message sent to gastroenterology        Final Clinical Impression(s) / ED Diagnoses Final diagnoses:  Rectal bleeding    Rx / DC Orders ED Discharge Orders     None         Treana Lacour, Annie Main, MD 10/25/21 1616

## 2021-10-25 NOTE — ED Notes (Signed)
ED Provider at bedside. 

## 2021-10-25 NOTE — Consult Note (Signed)
Referring Provider: Reubin Milan, MD Primary Care Physician:  [name], MD Primary Gastroenterologist:  [facility]  Reason for Consultation:  Rectal bleeding  HPI: [name] [name] is a 71 y.o. female who presents to the ED for bright red rectal bleeding and lightheadedness. Denies blood thinner use. Takes Tylenol frequently for hip and knee pain.  FOBT positive in the ED, CTA showed diverticulosis and large hiatal hernia, otherwise unrevealing, hemoglobin 8.9 on presentation, orthostatic hypotension.  Patient states [name] [name] had episode of diarrhea Monday morning, saw blood on the toilet tissue and in the toilet at that time.  Had 2 more similar episodes within a couple hours.  Symptoms resolved, patient went to work and had a normal day.  Then at 2 AM this morning had repeat episode of loose stools with significant amount of blood in the toilet.  [name] [name] had some associated lower abdominal discomfort and feeling of lightheadedness, otherwise no symptoms.    Denies nausea, vomiting, fever, chills, dysphagia.  Acid reflux controlled on Nexium daily.  Prior to to the last couple days patient was having normal bowel movements, daily or every other day.  Denies regular diarrhea or constipation.  Denies melena.  Last normal bowel movement Sunday.  Denies history of GI disease.  Denies prior history of similar episodes.  Patient works as a Solicitor, lives alone, husband passed away 07/25/2019.  Patient denies family history of colon cancer but states her father had stomach cancer.  Patient denies use of blood thinners or NSAIDs.  Takes Tylenol for joint pain.  Denies history of heart attack or stroke.  Not diabetic.  Last time [name] [name] ate was 1900 on 10/24/21.  Patient has not had a bowel movement since being in the ER.  States [name] [name] has an appetite.  Last colonoscopy 03/05/2018 with Dr. Penelope Coop at [facility] for positive fit test, findings of diverticulosis, otherwise normal, repeat screening not recommended.  Prior to that, last colonoscopy 07/24/02.  Patient's PCP is Dr. Sabra Heck with [facility].  Has appointment coming up in July for annual physical.   Past Medical History:  Diagnosis Date   Cancer ([place])    Macular degeneration    PONV (postoperative nausea and vomiting)     Past Surgical History:  Procedure Laterality Date   ABDOMINAL HYSTERECTOMY     BREAST EXCISIONAL BIOPSY Right    BREAST LUMPECTOMY WITH RADIOACTIVE SEED LOCALIZATION Right 01/18/2016   Procedure: RIGHT BREAST LUMPECTOMY WITH RADIOACTIVE SEED LOCALIZATION;  Surgeon: Erroll Luna, MD;  Location: [facility];  Service: General;  Laterality: Right;   BREAST SURGERY     MASTECTOMY Left    TURBINATE REDUCTION      Prior to Admission medications   Medication Sig Start Date End Date Taking? Authorizing Provider  aspirin 81 MG chewable tablet Chew by mouth daily.    [provider]  HYDROcodone-acetaminophen (NORCO) 5-325 MG tablet Take 1-2 tablets by mouth every 6 (six) hours as needed for moderate pain. 01/18/16   Erroll Luna, MD  Multiple Vitamins-Minerals (EYE VITAMINS & MINERALS PO) Take by mouth.    [provider]  Multiple Vitamins-Minerals (MULTIVITAMIN WITH MINERALS) tablet Take 1 tablet by mouth daily.    [provider]  Triamcinolone Acetonide (NASACORT ALLERGY 24HR NA) Place into the nose.    [provider]    Scheduled Meds:  sodium chloride (PF)       Continuous Infusions:  sodium chloride 125 mL/hr at 10/25/21 1203   pantoprazole 8 mg/hr (  10/25/21 1040)   PRN Meds:.acetaminophen **OR** acetaminophen, ondansetron **OR** ondansetron (ZOFRAN) IV  Allergies as of 10/25/2021   (No Known Allergies)    Family History  Problem Relation Age of Onset   BRCA 1/2 Neg Hx    Breast cancer Neg Hx     Social History   Socioeconomic History   Marital status: Married    Spouse name: Not on file   Number of children: Not on file   Years of education: Not on  file   Highest education level: Not on file  Occupational History   Not on file  Tobacco Use   Smoking status: Never   Smokeless tobacco: Never  Substance and Sexual Activity   Alcohol use: No   Drug use: No   Sexual activity: Not on file  Other Topics Concern   Not on file  Social History Narrative   Not on file   Social Determinants of Health   Financial Resource Strain: Not on file  Food Insecurity: Not on file  Transportation Needs: Not on file  Physical Activity: Not on file  Stress: Not on file  Social Connections: Not on file  Intimate Partner Violence: Not on file    Review of Systems: Review of Systems  Constitutional:  Negative for chills and fever.  HENT:  Negative for sore throat.   Eyes:  Negative for discharge and redness.  Respiratory:  Negative for shortness of breath, wheezing and stridor.   Cardiovascular:  Negative for chest pain and palpitations.  Gastrointestinal:  Positive for abdominal pain (mild, discomfort more than pain), blood in stool and diarrhea. Negative for constipation, heartburn, melena, nausea and vomiting.  Genitourinary:  Negative for dysuria and urgency.  Musculoskeletal:  Negative for falls and myalgias.  Skin:  Negative for itching and rash.  Neurological:  Negative for seizures and loss of consciousness.       Lightheadedness this morning  Endo/Heme/Allergies:  Negative for environmental allergies and polydipsia.  Psychiatric/Behavioral:  Negative for memory loss. The patient does not have insomnia.     Physical Exam:Physical Exam Constitutional:      General: [name] [name] is not in acute distress.    Appearance: Normal appearance. [name] [name] is not ill-appearing.  HENT:     Head: Normocephalic and atraumatic.     Right Ear: External ear normal.     Left Ear: External ear normal.     Nose: Nose normal.     Mouth/Throat:     Mouth: Mucous membranes are moist.     Pharynx: Oropharynx is clear.  Eyes:     General: No scleral icterus.     Extraocular Movements: Extraocular movements intact.     Comments: Conjunctival pallor  Cardiovascular:     Rate and Rhythm: Normal rate and regular rhythm.     Pulses: Normal pulses.     Heart sounds: Normal heart sounds.  Pulmonary:     Effort: Pulmonary effort is normal.     Breath sounds: Normal breath sounds.  Abdominal:     General: Bowel sounds are normal. There is no distension.     Palpations: Abdomen is soft.  Musculoskeletal:     Cervical back: Normal range of motion and neck supple.     Right lower leg: No edema.     Left lower leg: No edema.  Skin:    General: Skin is warm and dry.  Neurological:     General: No focal deficit present.     Mental Status: [name] [name] is alert  and oriented to person, place, and time.  Psychiatric:        Mood and Affect: Mood normal.        Behavior: Behavior normal.     Vital signs: Vitals:   10/25/21 1215 10/25/21 1230  BP: (!) 143/87 132/66  Pulse: (!) 116 93  Resp: 18 15  Temp:    SpO2: 100% 95%        GI:  Lab Results: Recent Labs    10/25/21 1008 10/25/21 1025  WBC 9.5  --   HGB 8.9* 9.9*  HCT 27.4* 29.0*  PLT 357  --    BMET Recent Labs    10/25/21 1008 10/25/21 1025  NA 138 138  K 3.9 4.0  CL 108 104  CO2 21*  --   GLUCOSE 137* 133*  BUN 24* 22  CREATININE 0.70 0.70  CALCIUM 8.9  --    LFT Recent Labs    10/25/21 1008  PROT 6.7  ALBUMIN 3.8  AST 17  ALT 16  ALKPHOS 67  BILITOT 0.5   PT/INR Recent Labs    10/25/21 1008  LABPROT 14.0  INR 1.1     Studies/Results: CT ANGIO GI BLEED  Result Date: 10/25/2021 CLINICAL DATA:  Bright red blood per rectum. EXAM: CTA ABDOMEN AND PELVIS WITHOUT AND WITH CONTRAST TECHNIQUE: Multidetector CT imaging of the abdomen and pelvis was performed using the standard protocol during bolus administration of intravenous contrast. Multiplanar reconstructed images and MIPs were obtained and reviewed to evaluate the vascular anatomy. RADIATION DOSE REDUCTION: This  exam was performed according to the departmental dose-optimization program which includes automated exposure control, adjustment of the mA and/or kV according to patient size and/or use of iterative reconstruction technique. CONTRAST:  175m OMNIPAQUE IOHEXOL 350 MG/ML SOLN COMPARISON:  None Available. FINDINGS: VASCULAR Aorta: Normal caliber aorta without aneurysm, dissection, vasculitis or significant stenosis. Celiac: Patent without evidence of aneurysm, dissection, vasculitis or significant stenosis. SMA: Patent without evidence of aneurysm, dissection, vasculitis or significant stenosis. Renals: Both renal arteries are patent without evidence of aneurysm, dissection, vasculitis, fibromuscular dysplasia or significant stenosis. IMA: Patent without evidence of aneurysm, dissection, vasculitis or significant stenosis. Inflow: Patent without evidence of aneurysm, dissection, vasculitis or significant stenosis. Proximal Outflow: Bilateral common femoral and visualized portions of the superficial and profunda femoral arteries are patent without evidence of aneurysm, dissection, vasculitis or significant stenosis. Veins: No obvious venous abnormality within the limitations of this arterial phase study. Review of the MIP images confirms the above findings. NON-VASCULAR Lower chest: Large hiatal hernia. Hepatobiliary: No focal liver abnormality is seen. No gallstones, gallbladder wall thickening, or biliary dilatation. Pancreas: Unremarkable. No pancreatic ductal dilatation or surrounding inflammatory changes. Spleen: Normal in size without focal abnormality. Adrenals/Urinary Tract: Adrenal glands are unremarkable. Kidneys are without renal calculi, or hydronephrosis. 1 cm exophytic mass off of the upper pole of the left kidney, too small to be actually characterized. Bladder is unremarkable. Stomach/Bowel: Stomach is within normal limits. No evidence of appendicitis. No evidence of bowel wall thickening, distention, or  inflammatory changes. Diffuse colonic diverticulosis without evidence of acute diverticulitis. Lymphatic: Aortic atherosclerosis. No enlarged abdominal or pelvic lymph nodes. Reproductive: Status post hysterectomy. No adnexal masses. Other: No abdominal wall hernia or abnormality. No abdominopelvic ascites. Musculoskeletal:  L4-L5 spondylosis. IMPRESSION: 1. No evidence of acute abnormalities within the abdomen or pelvis. 2. Diffuse colonic diverticulosis without evidence of acute diverticulitis. 3. Large hiatal hernia. Aortic Atherosclerosis (ICD10-I70.0). Electronically Signed   By: DThomas Hoff  Dimitrova M.D.   On: 10/25/2021 12:01    Impression: Rectal bleeding - Hemoglobin 9.9, normal MCV, no leukocytosis, platelets 357 - FOBT positive in the ED - CTA 10/25/2021 shows no evidence of acute abnormalities within the abdomen or pelvis.  Diffuse colonic diverticulosis without evidence of acute diverticulitis.  Large hiatal hernia. - Normal electrolytes and LFTs.  Normal kidney function.   Plan: Suspect diverticular bleed.  We will continue to monitor.  Discussed colonoscopy for persistent rectal bleeding or worsening anemia. Okay to have clear liquids today.  Consider advancing diet tomorrow if no recurrence of rectal bleeding. Continue daily CBC and transfuse as needed to maintain HGB > 7  Continue IV Protonix. Continue supportive care. [facility] will follow.    LOS: 0 days   Angelique Holm  PA-C 10/25/2021, 1:05 PM  Contact #  [phone]

## 2021-10-25 NOTE — ED Triage Notes (Signed)
Per [facility] pt coming from home reports diarrhea onset of yesterday morning  with an episode of bright red blood. Patient got up around 2am and had another episode. After getting up this morning had a 3rd episode of bright red rectal bleeding and began feeling light headed. Patient A&0x4. Denies any blood thinners. Reports she takes tylenol frequently for hip and knee pain.

## 2021-10-26 DIAGNOSIS — K922 Gastrointestinal hemorrhage, unspecified: Secondary | ICD-10-CM | POA: Diagnosis not present

## 2021-10-26 LAB — TYPE AND SCREEN
ABO/RH(D): AB POS
Antibody Screen: NEGATIVE
Unit division: 0
Unit division: 0

## 2021-10-26 LAB — CBC
HCT: 28.6 % — ABNORMAL LOW (ref 36.0–46.0)
HCT: 28.8 % — ABNORMAL LOW (ref 36.0–46.0)
Hemoglobin: 9.3 g/dL — ABNORMAL LOW (ref 12.0–15.0)
Hemoglobin: 9.4 g/dL — ABNORMAL LOW (ref 12.0–15.0)
MCH: 29.8 pg (ref 26.0–34.0)
MCH: 29.7 pg (ref 26.0–34.0)
MCHC: 32.5 g/dL (ref 30.0–36.0)
MCHC: 32.6 g/dL (ref 30.0–36.0)
MCV: 91.7 fL (ref 80.0–100.0)
MCV: 91.1 fL (ref 80.0–100.0)
Platelets: 292 10*3/uL (ref 150–400)
Platelets: 294 10*3/uL (ref 150–400)
RBC: 3.12 MIL/uL — ABNORMAL LOW (ref 3.87–5.11)
RBC: 3.16 MIL/uL — ABNORMAL LOW (ref 3.87–5.11)
RDW: 14.3 % (ref 11.5–15.5)
RDW: 14.7 % (ref 11.5–15.5)
WBC: 9.1 10*3/uL (ref 4.0–10.5)
WBC: 7.4 10*3/uL (ref 4.0–10.5)
nRBC: 0 % (ref 0.0–0.2)
nRBC: 0 % (ref 0.0–0.2)

## 2021-10-26 LAB — BPAM RBC
Blood Product Expiration Date: 202306262359
Blood Product Expiration Date: 202306262359
ISSUE DATE / TIME: 202306061953
ISSUE DATE / TIME: 202306062225
Unit Type and Rh: 6200
Unit Type and Rh: 6200

## 2021-10-26 LAB — LACTIC ACID, PLASMA: Lactic Acid, Venous: 1.5 mmol/L (ref 0.5–1.9)

## 2021-10-26 MED ORDER — POLYETHYLENE GLYCOL 3350 17 G PO PACK: 17.0000 g | PACK | Freq: Every day | ORAL | Status: DC

## 2021-10-26 MED ORDER — ATORVASTATIN CALCIUM 10 MG PO TABS
10.0000 mg | ORAL_TABLET | Freq: Every day | ORAL | Status: DC
Start: 2021-10-26 — End: 2021-10-30
  Administered 2021-10-26 – 2021-10-30 (×5): 10 mg via ORAL
  Filled 2021-10-26 (×5): qty 1

## 2021-10-26 NOTE — Progress Notes (Signed)
[name] [name]  OVZ:[id number] DOB: [birth year] DOA: 10/25/2021 PCP: [name], MD     Brief Narrative:  H/ o hyperlipidemia, history of breast cancer, mastectomy, macular degeneration,  GERD,  who is coming to the emergency department due to  profuse hematochezia  associated with lightheadedness  CTA GI bleed with no evidence of acute abnormalities within the abdomen or pelvis.  There was diffuse colonic diverticulosis without evidence of acute diverticulitis.  Large hiatal hernia.  Aortic atherosclerosis.  Subjective:  Slightly  headache and nausea, seems able to tolerate clears,  No active bleeding,  Do not feel she can go home today  Assessment & Plan:  Principal Problem:   Acute lower GI bleeding Active Problems:   GERD (gastroesophageal reflux disease)   Diverticulosis of intestine with bleeding   Acute blood loss anemia   Hyperlipidemia   Aortic atherosclerosis (HCC)   Acute blood loss anemia likely from diverticular bleed -She denies abdominal pain, reports feeling better after 2 units PRBC transfusion -Seen by [facility] who recommended continue IV PPI for now -Diet advancement per GI    GERD/large hiatal hernia Continue PPI Follow-up with GI  Hyperlipidemia/aortic atherosclerosis Continue statin    : Body mass index is 31.45 kg/m.,  Meet obesity criteria .  I have Reviewed nursing notes, Vitals, pain scores, I/o's, Lab results and  imaging results since pt's last encounter, details please see discussion above  I ordered the following labs:  Unresulted Labs (From admission, onward)     Start     Ordered   10/27/21 0500  CBC  Tomorrow morning,   R        10/26/21 1010   10/27/21 4128  Basic metabolic panel  Tomorrow morning,   R        10/26/21 1010   10/27/21 0500  Magnesium  Tomorrow morning,   R        10/26/21 1010   10/26/21 1705  CBC  ONCE - STAT,   STAT        10/26/21 1705             DVT prophylaxis: SCDs Start:  10/25/21 1254   Code Status:   Code Status: Full Code  Family Communication: Patient Disposition:    Dispo: The patient is from: home, lives alone              Anticipated d/c is to: Home              Anticipated d/c date is: Hopefully 6/8 if cleared by GI  Antimicrobials:   Anti-infectives (From admission, onward)    None           Objective: Vitals:   10/26/21 1300 10/26/21 1407 10/26/21 1409 10/26/21 1513  BP: (!) 144/83  134/83 (!) 150/89  Pulse: 90  94 91  Resp: '20  16 18  '$ Temp:  98.4 F (36.9 C)  98.3 F (36.8 C)  TempSrc:  Oral  Oral  SpO2: 97%  97% 98%  Weight:      Height:        Intake/Output Summary (Last 24 hours) at 10/26/2021 1713 Last data filed at 10/26/2021 1526 Gross per 24 hour  Intake 2182.29 ml  Output --  Net 2182.29 ml   Filed Weights   10/25/21 0954  Weight: 85.7 kg    Examination:  General exam: alert, awake, communicative,calm, NAD Respiratory system: Clear to auscultation. Respiratory effort normal. Cardiovascular system:  RRR.  Gastrointestinal system: Abdomen is nondistended, soft and nontender.  Normal bowel sounds heard. Central nervous system: Alert and oriented. No focal neurological deficits. Extremities:  no edema Skin: No rashes, lesions or ulcers Psychiatry: Judgement and insight appear normal. Mood & affect appropriate.     Data Reviewed: I have personally reviewed  labs and visualized  imaging studies since the last encounter and formulate the plan        Scheduled Meds:  sodium chloride   Intravenous Once   atorvastatin  10 mg Oral Daily   [START ON 10/27/2021] polyethylene glycol  17 g Oral Daily   Continuous Infusions:  sodium chloride 125 mL/hr at 10/26/21 1526   pantoprazole 8 mg/hr (10/26/21 1526)     LOS: 1 day   Florencia Reasons, MD PhD FACP [facility]  Available via Epic secure chat 7am-7pm for nonurgent issues Please page for urgent issues To page the attending provider between 7A-7P or  the covering provider during after hours 7P-7A, please log into the web site [email or web address] and access using universal [place] password for that web site. If you do not have the password, please call the hospital operator.    10/26/2021, 5:13 PM

## 2021-10-26 NOTE — Progress Notes (Addendum)
[name] [name] 10:55 AM  Subjective: Patient doing well today tolerating clear liquids no further bowel movement no signs of bleeding no abdominal pain no new complaints  Objective: No signs stable afebrile no acute distress abdomen is soft nontender hemoglobin increased with transfusion BUN and creatinine decreased  Assessment: Probable diverticular bleeding currently stable  Plan: If clear liquids for lunch goes well will allow soft solids for evening meal and hopefully if no further bleeding or problems can go home tomorrow and we encouraged her to walk the halls when able based on room situation  [facility]  office [phone] After 5PM or if no answer call [phone]

## 2021-10-27 DIAGNOSIS — K922 Gastrointestinal hemorrhage, unspecified: Secondary | ICD-10-CM | POA: Diagnosis not present

## 2021-10-27 LAB — CBC
HCT: 24.4 % — ABNORMAL LOW (ref 36.0–46.0)
Hemoglobin: 7.8 g/dL — ABNORMAL LOW (ref 12.0–15.0)
MCH: 30 pg (ref 26.0–34.0)
MCHC: 32 g/dL (ref 30.0–36.0)
MCV: 93.8 fL (ref 80.0–100.0)
Platelets: 245 10*3/uL (ref 150–400)
RBC: 2.6 MIL/uL — ABNORMAL LOW (ref 3.87–5.11)
RDW: 14.8 % (ref 11.5–15.5)
WBC: 7.3 10*3/uL (ref 4.0–10.5)
nRBC: 0 % (ref 0.0–0.2)

## 2021-10-27 LAB — BASIC METABOLIC PANEL
Anion gap: 7 (ref 5–15)
BUN: 9 mg/dL (ref 8–23)
CO2: 23 mmol/L (ref 22–32)
Calcium: 7.9 mg/dL — ABNORMAL LOW (ref 8.9–10.3)
Chloride: 112 mmol/L — ABNORMAL HIGH (ref 98–111)
Creatinine, Ser: 0.49 mg/dL (ref 0.44–1.00)
GFR, Estimated: >60 mL/min (ref >60)
Glucose, Bld: 113 mg/dL — ABNORMAL HIGH (ref 70–99)
Potassium: 3.5 mmol/L (ref 3.5–5.1)
Sodium: 142 mmol/L (ref 135–145)

## 2021-10-27 LAB — HEMOGLOBIN AND HEMATOCRIT, BLOOD
HCT: 25.5 % — ABNORMAL LOW (ref 36.0–46.0)
Hemoglobin: 8.5 g/dL — ABNORMAL LOW (ref 12.0–15.0)

## 2021-10-27 LAB — MAGNESIUM: Magnesium: 2.2 mg/dL (ref 1.7–2.4)

## 2021-10-27 MED ORDER — PANTOPRAZOLE SODIUM 40 MG IV SOLR
40.0000 mg | Freq: Two times a day (BID) | INTRAVENOUS | Status: DC
  Administered 2021-10-27 – 2021-10-30 (×6): 40 mg via INTRAVENOUS
  Filled 2021-10-27 (×6): qty 10

## 2021-10-27 NOTE — TOC Initial Note (Signed)
Transition of Care [facility]) - Initial/Assessment Note    Patient Details  Name: [name] [name] MRN: [id number] Date of Birth: 07/02/1950  Transition of Care [facility]) CM/SW Contact:    Leeroy Cha, RN Phone Number: 10/27/2021, 9:23 AM  Clinical Narrative:                  Transition of Care [facility]) Screening Note   Patient Details  Name: [name] [name] Date of Birth: [birth year]   [facility]) CM/SW Contact:    Leeroy Cha, RN Phone Number: 10/27/2021, 9:23 AM    Transition of Care Department [facility]) has reviewed patient and no TOC needs have been identified at this time. We will continue to monitor patient advancement through interdisciplinary progression rounds. If new patient transition needs arise, please place a TOC consult.    Expected Discharge Plan: Home/Self Care Barriers to Discharge: Continued Medical Work up   Patient Goals and CMS Choice Patient states their goals for this hospitalization and ongoing recovery are:: to return home CMS Medicare.gov Compare Post Acute Care list provided to:: Patient    Expected Discharge Plan and Services Expected Discharge Plan: Home/Self Care   Discharge Planning Services: CM Consult   Living arrangements for the past 2 months: Single Family Home                                      Prior Living Arrangements/Services Living arrangements for the past 2 months: Single Family Home Lives with:: Spouse Patient language and need for interpreter reviewed:: Yes Do you feel safe going back to the place where you live?: Yes            Criminal Activity/Legal Involvement Pertinent to Current Situation/Hospitalization: No - Comment as needed  Activities of Daily Living Home Assistive Devices/Equipment: Eyeglasses, Cane (specify quad or straight) ADL Screening (condition at time of admission) Patient's cognitive ability adequate to safely complete daily activities?: Yes Is the patient deaf or have  difficulty hearing?: No Does the patient have difficulty seeing, even when wearing glasses/contacts?: No (has a cataract on left eye) Does the patient have difficulty concentrating, remembering, or making decisions?: No Patient able to express need for assistance with ADLs?: Yes Does the patient have difficulty dressing or bathing?: No Independently performs ADLs?: Yes (appropriate for developmental age) Does the patient have difficulty walking or climbing stairs?: No Weakness of Legs: Both (hx of some problems with right knee and right hip) Weakness of Arms/Hands: Both  Permission Sought/Granted                  Emotional Assessment Appearance:: Appears stated age     Orientation: : Oriented to Self, Oriented to Place, Oriented to  Time, Oriented to Situation Alcohol / Substance Use: Not Applicable Psych Involvement: No (comment)  Admission diagnosis:  Rectal bleeding [K62.5] Acute lower GI bleeding [K92.2] Patient Active Problem List   Diagnosis Date Noted   Acute lower GI bleeding 10/25/2021   GERD (gastroesophageal reflux disease) 10/25/2021   Diverticulosis of intestine with bleeding 10/25/2021   Acute blood loss anemia 10/25/2021   Hyperlipidemia 10/25/2021   Aortic atherosclerosis ([place]) 10/25/2021   PCP:  [name], MD Pharmacy:   [id number]: [phone]: [phone]    Social Determinants of Health (SDOH) Interventions  Readmission Risk Interventions     No data to display

## 2021-10-27 NOTE — Progress Notes (Signed)
[facility] Progress Note  [name] [name] 71 y.o. Oct 19, 1950  CC:  Rectal bleeding   Subjective: Patient resting in bed this morning, nurse at bedside.  Reports having 2 episodes of rectal bleeding last night/this morning.  Per nurse there was a significant amount of blood in the toilet, no visible stool.  Patient states she feels weak and nauseated this morning, discouraged by further bleeding.  Tolerating clear liquid diet.  Denies vomiting, fever, chills, abdominal pain though she has had some discomfort due to gas.  ROS : Review of Systems  Constitutional:  Negative for chills and fever.  Gastrointestinal:  Positive for blood in stool, diarrhea and nausea. Negative for abdominal pain, constipation, heartburn, melena and vomiting.  Genitourinary:  Negative for dysuria and urgency.  Musculoskeletal:  Negative for falls.  Neurological:  Positive for weakness. Negative for loss of consciousness.      Objective: Vital signs in last 24 hours: Vitals:   10/27/21 0124 10/27/21 0616  BP: 123/75 130/79  Pulse: 81 87  Resp: 16 18  Temp: 98.3 F (36.8 C) 97.8 F (36.6 C)  SpO2: 95% 96%    Physical Exam:  General:  Alert, cooperative, no distress, appears stated age  Head:  Normocephalic, without obvious abnormality, atraumatic  Eyes:  Anicteric sclera, EOM's intact, conjunctival pallor  Lungs:   Clear to auscultation bilaterally, respirations unlabored  Heart:  Regular rate and rhythm, S1, S2 normal  Abdomen:   Soft, non-tender, bowel sounds active all four quadrants,  no masses,   Extremities: Extremities normal, atraumatic, no  edema  Pulses: 2+ and symmetric    Lab Results: Recent Labs    10/25/21 1822 10/27/21 0409  NA 142 142  K 3.7 3.5  CL 114* 112*  CO2 21* 23  GLUCOSE 120* 113*  BUN 14 9  CREATININE 0.59 0.49  CALCIUM 8.0* 7.9*  MG  --  2.2   Recent Labs    10/25/21 1008  AST 17  ALT 16  ALKPHOS 67  BILITOT 0.5  PROT 6.7  ALBUMIN 3.8    Recent Labs    10/25/21 1008 10/25/21 1025 10/26/21 1729 10/27/21 0409  WBC 9.5   < > 7.4 7.3  NEUTROABS 6.5  --   --   --   HGB 8.9*   < > 9.4* 7.8*  HCT 27.4*   < > 28.8* 24.4*  MCV 91.6   < > 91.1 93.8  PLT 357   < > 294 245   < > = values in this interval not displayed.   Recent Labs    10/25/21 1008  LABPROT 14.0  INR 1.1    Assessment Rectal bleeding - Hemoglobin 7.4 on presentation, improved to 9.4 after 2 units PRBCs, now down to 7.8 this morning. - CTA 10/25/2021 shows no evidence of acute abnormalities within the abdomen or pelvis.  Diffuse colonic diverticulosis without evidence of acute diverticulitis.  Large hiatal hernia. - Normal MCV, no leukocytosis. - Further rectal bleeding last night/this morning, significant per patient and nurse. - Normal electrolytes and LFTs.  Normal kidney function.  Plan: Patient with recurrence of rectal bleeding, likely diverticular.  Will discuss with Dr. Watt Climes whether procedure/intervention necessary and see patient again later today. Continue daily CBC and transfuse as needed to maintain HGB > 7  Continue supportive care and antiemetics as needed. Continue IV Protonix. Continue clear liquid diet for now. [facility] will follow.   Angelique Holm PA-C 10/27/2021, 9:58 AM  Contact #  [phone]  

## 2021-10-27 NOTE — Progress Notes (Signed)
PROGRESS NOTE    BAYLEY YARBOROUGH  IWP:[id number] DOB: 1950-09-28 DOA: 10/25/2021 PCP: [name], MD     Brief Narrative:  H/ o hyperlipidemia, history of breast cancer, mastectomy, macular degeneration,  GERD,  who is coming to the emergency department due to  profuse hematochezia  associated with lightheadedness  CTA GI bleed with no evidence of acute abnormalities within the abdomen or pelvis.  There was diffuse colonic diverticulosis without evidence of acute diverticulitis.  Large hiatal hernia.  Aortic atherosclerosis.  Subjective:  Had another large bloody bm yesterday pm, feeling tired and feeling nauseous , hgb dropped to 7.8 this am  seems able to tolerate clears,    Assessment & Plan:  Principal Problem:   Acute lower GI bleeding Active Problems:   GERD (gastroesophageal reflux disease)   Diverticulosis of intestine with bleeding   Acute blood loss anemia   Hyperlipidemia   Aortic atherosclerosis (HCC)   Acute blood loss anemia likely from diverticular bleed -She denies abdominal pain -s/p 2 units PRBC transfusion -Seen by [facility] who recommended continue IV PPI for now -Diet advancement per GI    GERD/large hiatal hernia Continue PPI Follow-up with GI  Hyperlipidemia/aortic atherosclerosis Continue statin    : Body mass index is 31.45 kg/m.,  Meet obesity criteria .  I have Reviewed nursing notes, Vitals, pain scores, I/o's, Lab results and  imaging results since pt's last encounter, details please see discussion above  I ordered the following labs:  Unresulted Labs (From admission, onward)     Start     Ordered   10/28/21 5053  Basic metabolic panel  Tomorrow morning,   R       Question:  Specimen collection method  Answer:  Lab=Lab collect   10/27/21 1835   10/27/21 1700  Hemoglobin and hematocrit, blood  5A & 5P,   R (with TIMED occurrences)     Question:  Specimen collection method  Answer:  Lab=Lab collect   10/27/21 1339              DVT prophylaxis: SCDs Start: 10/25/21 1254   Code Status:   Code Status: Full Code  Family Communication: Patient Disposition:    Dispo: The patient is from: home, lives alone              Anticipated d/c is to: Home              Anticipated d/c date is: if no more bleeding and hgb stable, needs clearance  by GI  Antimicrobials:   Anti-infectives (From admission, onward)    None           Objective: Vitals:   10/26/21 2119 10/27/21 0124 10/27/21 0616 10/27/21 1211  BP: (!) 141/65 123/75 130/79 123/75  Pulse: 93 81 87 77  Resp: '16 16 18 16  '$ Temp: 98.1 F (36.7 C) 98.3 F (36.8 C) 97.8 F (36.6 C) 98.2 F (36.8 C)  TempSrc: Oral Oral Oral Oral  SpO2: 95% 95% 96% 94%  Weight:      Height:        Intake/Output Summary (Last 24 hours) at 10/27/2021 1836 Last data filed at 10/27/2021 1544 Gross per 24 hour  Intake 3719.33 ml  Output 0 ml  Net 3719.33 ml   Filed Weights   10/25/21 0954  Weight: 85.7 kg    Examination:  General exam: alert, awake, communicative,calm, NAD Respiratory system: Clear to auscultation. Respiratory effort normal. Cardiovascular system:  RRR.  Gastrointestinal system:  Abdomen is nondistended, soft and nontender.  Normal bowel sounds heard. Central nervous system: Alert and oriented. No focal neurological deficits. Extremities:  no edema Skin: No rashes, lesions or ulcers Psychiatry: Judgement and insight appear normal. Mood & affect appropriate.     Data Reviewed: I have personally reviewed  labs and visualized  imaging studies since the last encounter and formulate the plan        Scheduled Meds:  sodium chloride   Intravenous Once   atorvastatin  10 mg Oral Daily   pantoprazole (PROTONIX) IV  40 mg Intravenous Q12H   Continuous Infusions:     LOS: 2 days   Florencia Reasons, MD PhD FACP [facility]  Available via Epic secure chat 7am-7pm for nonurgent issues Please page for urgent issues To page the  attending provider between 7A-7P or the covering provider during after hours 7P-7A, please log into the web site [email or web address] and access using universal [place] password for that web site. If you do not have the password, please call the hospital operator.    10/27/2021, 6:36 PM

## 2021-10-28 ENCOUNTER — Inpatient Hospital Stay (HOSPITAL_COMMUNITY): Payer: Medicare PPO

## 2021-10-28 DIAGNOSIS — K922 Gastrointestinal hemorrhage, unspecified: Secondary | ICD-10-CM | POA: Diagnosis not present

## 2021-10-28 LAB — BASIC METABOLIC PANEL
Anion gap: 7 (ref 5–15)
BUN: 7 mg/dL — ABNORMAL LOW (ref 8–23)
CO2: 25 mmol/L (ref 22–32)
Calcium: 8.4 mg/dL — ABNORMAL LOW (ref 8.9–10.3)
Chloride: 109 mmol/L (ref 98–111)
Creatinine, Ser: 0.56 mg/dL (ref 0.44–1.00)
GFR, Estimated: >60 mL/min (ref >60)
Glucose, Bld: 103 mg/dL — ABNORMAL HIGH (ref 70–99)
Potassium: 3.2 mmol/L — ABNORMAL LOW (ref 3.5–5.1)
Sodium: 141 mmol/L (ref 135–145)

## 2021-10-28 LAB — HEMOGLOBIN AND HEMATOCRIT, BLOOD
HCT: 25.2 % — ABNORMAL LOW (ref 36.0–46.0)
HCT: 26 % — ABNORMAL LOW (ref 36.0–46.0)
HCT: 26.5 % — ABNORMAL LOW (ref 36.0–46.0)
Hemoglobin: 8.2 g/dL — ABNORMAL LOW (ref 12.0–15.0)
Hemoglobin: 8.4 g/dL — ABNORMAL LOW (ref 12.0–15.0)
Hemoglobin: 8.6 g/dL — ABNORMAL LOW (ref 12.0–15.0)

## 2021-10-28 MED ORDER — POTASSIUM CHLORIDE 20 MEQ PO PACK
40.0000 meq | PACK | Freq: Once | ORAL | Status: AC
  Administered 2021-10-28: 40 meq via ORAL
  Filled 2021-10-28: qty 2

## 2021-10-28 MED ORDER — POLYETHYLENE GLYCOL 3350 17 G PO PACK
17.0000 g | PACK | Freq: Every day | ORAL | Status: DC
  Administered 2021-10-29 – 2021-10-30 (×2): 17 g via ORAL
  Filled 2021-10-28 (×2): qty 1

## 2021-10-28 MED ORDER — POTASSIUM CHLORIDE 20 MEQ PO PACK
40.0000 meq | PACK | ORAL | Status: AC
  Administered 2021-10-28: 40 meq via ORAL
  Filled 2021-10-28: qty 2

## 2021-10-28 NOTE — Progress Notes (Signed)
Pt had 1 medium size bloody stool this afternoon followed by a small bloody loose BM. MD Erlinda Hong notified. Pt states it is an improvement from what she was having at home but from my perspective still a notable amount of blood. H/H orders are in. Hat is in commode for stool collection/assessments. VSS. Pt denies dizziness/SOB.No c/o pain.

## 2021-10-28 NOTE — Progress Notes (Signed)
PROGRESS NOTE    [name] [name]  [id number]:[id number] DOB: Sep 26, 1950 DOA: 10/25/2021 PCP: [name], MD     Brief Narrative:  H/ o hyperlipidemia, history of breast cancer, mastectomy, macular degeneration,  GERD,  who is coming to the emergency department due to  profuse hematochezia  associated with lightheadedness  CTA GI bleed with no evidence of acute abnormalities within the abdomen or pelvis.  There was diffuse colonic diverticulosis without evidence of acute diverticulitis.  Large hiatal hernia.  Aortic atherosclerosis.  Subjective:   Started vomit yesterday evening around shift change, 4-5 times, last around 6am , with small amount of rectal bleeding at that time Reports stomach is distended, but no frank pain    Assessment & Plan:  Principal Problem:   Acute lower GI bleeding Active Problems:   GERD (gastroesophageal reflux disease)   Diverticulosis of intestine with bleeding   Acute blood loss anemia   Hyperlipidemia   Aortic atherosclerosis (HCC)   Acute blood loss anemia likely from diverticular bleed -She denies abdominal pain -s/p 2 units PRBC transfusion -Seen by [facility] who recommended continue IV PPI for now -Diet advancement per GI    GERD/large hiatal hernia Continue PPI Follow-up with GI  N/V Will get KUB  Hyperlipidemia/aortic atherosclerosis Continue statin    : Body mass index is 31.45 kg/m.,  Meet obesity criteria .  I have Reviewed nursing notes, Vitals, pain scores, I/o's, Lab results and  imaging results since pt's last encounter, details please see discussion above  I ordered the following labs:  Unresulted Labs (From admission, onward)     Start     Ordered   10/29/21 3009  Basic metabolic panel  Tomorrow morning,   R       Question:  Specimen collection method  Answer:  Lab=Lab collect   10/28/21 0858   10/29/21 0500  Magnesium  Tomorrow morning,   R       Question:  Specimen collection method  Answer:  Lab=Lab collect    10/28/21 0858   10/27/21 1700  Hemoglobin and hematocrit, blood  5A & 5P,   R (with TIMED occurrences)     Question:  Specimen collection method  Answer:  Lab=Lab collect   10/27/21 1339             DVT prophylaxis: SCDs Start: 10/25/21 1254   Code Status:   Code Status: Full Code  Family Communication: Patient Disposition:    Dispo: The patient is from: home, lives alone              Anticipated d/c is to: Home              Anticipated d/c date is: if no more bleeding and hgb stable, need to advance diet, needs clearance  by GI  Antimicrobials:   Anti-infectives (From admission, onward)    None           Objective: Vitals:   10/27/21 0124 10/27/21 0616 10/27/21 1211 10/27/21 2046  BP: 123/75 130/79 123/75 135/69  Pulse: 81 87 77 78  Resp: '16 18 16 18  '$ Temp: 98.3 F (36.8 C) 97.8 F (36.6 C) 98.2 F (36.8 C) 98.2 F (36.8 C)  TempSrc: Oral Oral Oral Oral  SpO2: 95% 96% 94% 96%  Weight:      Height:        Intake/Output Summary (Last 24 hours) at 10/28/2021 1019 Last data filed at 10/27/2021 1836 Gross per 24 hour  Intake 1619.33 ml  Output 3 ml  Net 1616.33 ml   Filed Weights   10/25/21 0954  Weight: 85.7 kg    Examination:  General exam: alert, awake, communicative,calm, NAD Respiratory system: Clear to auscultation. Respiratory effort normal. Cardiovascular system:  RRR.  Gastrointestinal system: Abdomen is nondistended, soft and nontender.  Normal bowel sounds heard. Central nervous system: Alert and oriented. No focal neurological deficits. Extremities:  no edema Skin: No rashes, lesions or ulcers Psychiatry: Judgement and insight appear normal. Mood & affect appropriate.     Data Reviewed: I have personally reviewed  labs and visualized  imaging studies since the last encounter and formulate the plan        Scheduled Meds:  sodium chloride   Intravenous Once   atorvastatin  10 mg Oral Daily   pantoprazole (PROTONIX) IV  40 mg  Intravenous Q12H   potassium chloride  40 mEq Oral Q4H   Continuous Infusions:     LOS: 3 days   Florencia Reasons, MD PhD FACP [facility]  Available via Epic secure chat 7am-7pm for nonurgent issues Please page for urgent issues To page the attending provider between 7A-7P or the covering provider during after hours 7P-7A, please log into the web site [email or web address] and access using universal [place] password for that web site. If you do not have the password, please call the hospital operator.    10/28/2021, 10:19 AM

## 2021-10-28 NOTE — Progress Notes (Signed)
[facility] Progress Note  MERCADEZ HEITMAN 71 y.o. 25-Jul-1950  CC:  Rectal bleeding   Subjective: Patient resting in bed this morning, states overall she feels she is improving, but still feels weak and tired.  Has had a couple episodes of emesis and feels nauseated but still keeping down most clear liquids.  She had another episode of rectal bleeding around 1830 last night and reports smaller amount of blood compared to previous days, blood darker in color and with more clots.  No stool present.  ROS : Review of Systems  Constitutional:  Negative for chills and fever.  Gastrointestinal:  Positive for blood in stool, nausea and vomiting. Negative for abdominal pain, constipation, diarrhea, heartburn and melena.  Genitourinary:  Negative for dysuria and flank pain.  Musculoskeletal:  Negative for falls.  Neurological:  Positive for weakness. Negative for loss of consciousness.      Objective: Vital signs in last 24 hours: Vitals:   10/27/21 1211 10/27/21 2046  BP: 123/75 135/69  Pulse: 77 78  Resp: 16 18  Temp: 98.2 F (36.8 C) 98.2 F (36.8 C)  SpO2: 94% 96%    Physical Exam:  General:  Alert, cooperative, no distress, appears stated age  Head:  Normocephalic, without obvious abnormality, atraumatic  Eyes:  Anicteric sclera, EOM's intact, conjunctival pallor  Lungs:   Clear to auscultation bilaterally, respirations unlabored  Heart:  Regular rate and rhythm, S1, S2 normal  Abdomen:   Soft, non-tender, bowel sounds active all four quadrants,  no masses,   Extremities: Extremities normal, atraumatic, no  edema  Pulses: 2+ and symmetric    Lab Results: Recent Labs    10/27/21 0409 10/28/21 0653  NA 142 141  K 3.5 3.2*  CL 112* 109  CO2 23 25  GLUCOSE 113* 103*  BUN 9 7*  CREATININE 0.49 0.56  CALCIUM 7.9* 8.4*  MG 2.2  --    No results for input(s): "AST", "ALT", "ALKPHOS", "BILITOT", "PROT", "ALBUMIN" in the last 72 hours. Recent Labs     10/26/21 1729 10/27/21 0409 10/27/21 1548 10/28/21 0653  WBC 7.4 7.3  --   --   HGB 9.4* 7.8* 8.5* 8.2*  HCT 28.8* 24.4* 25.5* 25.2*  MCV 91.1 93.8  --   --   PLT 294 245  --   --    No results for input(s): "LABPROT", "INR" in the last 72 hours.    Assessment Rectal bleeding - Hemoglobin 7.4 on presentation, improved to 9.4 after 2 units PRBCs, 8.2 this morning - CTA 10/25/2021 shows no evidence of acute abnormalities within the abdomen or pelvis.  Diffuse colonic diverticulosis without evidence of acute diverticulitis.  Large hiatal hernia. - Normal MCV, no leukocytosis. - Further rectal bleeding last night, darker and smaller amount than previous episodes, patient notes improvement - Normal electrolytes and LFTs.  Normal kidney function.  Plan: Patient with likely diverticular bleeding, improving.  No plan for procedures at this time. Continue daily CBC and transfuse as needed to maintain HGB > 7  Continue supportive care and antiemetics as needed. Continue IV Protonix. We will try advancing to full liquid diet. [facility] will follow.   Angelique Holm PA-C 10/28/2021, 11:03 AM  Contact #  [phone]

## 2021-10-29 DIAGNOSIS — K922 Gastrointestinal hemorrhage, unspecified: Secondary | ICD-10-CM | POA: Diagnosis not present

## 2021-10-29 LAB — CBC
HCT: 24.7 % — ABNORMAL LOW (ref 36.0–46.0)
Hemoglobin: 8 g/dL — ABNORMAL LOW (ref 12.0–15.0)
MCH: 30.4 pg (ref 26.0–34.0)
MCHC: 32.4 g/dL (ref 30.0–36.0)
MCV: 93.9 fL (ref 80.0–100.0)
Platelets: 306 10*3/uL (ref 150–400)
RBC: 2.63 MIL/uL — ABNORMAL LOW (ref 3.87–5.11)
RDW: 14.6 % (ref 11.5–15.5)
WBC: 7.4 10*3/uL (ref 4.0–10.5)
nRBC: 0 % (ref 0.0–0.2)

## 2021-10-29 LAB — BASIC METABOLIC PANEL
Anion gap: <3 — ABNORMAL LOW (ref 5–15)
BUN: 9 mg/dL (ref 8–23)
CO2: 29 mmol/L (ref 22–32)
Calcium: 8.4 mg/dL — ABNORMAL LOW (ref 8.9–10.3)
Chloride: 109 mmol/L (ref 98–111)
Creatinine, Ser: 0.7 mg/dL (ref 0.44–1.00)
GFR, Estimated: >60 mL/min (ref >60)
Glucose, Bld: 126 mg/dL — ABNORMAL HIGH (ref 70–99)
Potassium: 3.4 mmol/L — ABNORMAL LOW (ref 3.5–5.1)
Sodium: 140 mmol/L (ref 135–145)

## 2021-10-29 LAB — MAGNESIUM: Magnesium: 2.3 mg/dL (ref 1.7–2.4)

## 2021-10-29 MED ORDER — ORAL CARE MOUTH RINSE
15.0000 mL | Freq: Two times a day (BID) | OROMUCOSAL | Status: DC
  Administered 2021-10-29 – 2021-10-30 (×3): 15 mL via OROMUCOSAL

## 2021-10-29 MED ORDER — POTASSIUM CHLORIDE CRYS ER 20 MEQ PO TBCR
40.0000 meq | EXTENDED_RELEASE_TABLET | Freq: Once | ORAL | Status: AC
  Administered 2021-10-29: 40 meq via ORAL
  Filled 2021-10-29: qty 2

## 2021-10-29 NOTE — Progress Notes (Signed)
Sheralyn Boatman 11:45 AM  Subjective: Patient seen and examined and she says she is doing better and feeling better and her bowel movement was dark and not any fresh blood which I confirmed and she has no new complaints  Objective: Vital signs stable afebrile no acute distress abdomen is soft nontender hemoglobin fairly stable  Assessment: Seemingly resolved diverticular bleeding  Plan: Okay with me to advance to soft diet which we discussed and she is excited to try she will increase her activity might need 1 iron pill at home a day and happy to see back as an outpatient when discharged and follow-up and again we discussed that the occasional recurrent nature of diverticular bleeding  [facility] E  office [phone] After 5PM or if no answer call [phone]

## 2021-10-29 NOTE — Progress Notes (Signed)
PROGRESS NOTE    [name] [name]  FTD:[id number] DOB: 31-Jul-1950 DOA: 10/25/2021 PCP: [name], MD     Brief Narrative:  H/ o hyperlipidemia, history of breast cancer, mastectomy, macular degeneration,  GERD,  who is coming to the emergency department due to  profuse hematochezia  associated with lightheadedness  CTA GI bleed with no evidence of acute abnormalities within the abdomen or pelvis.  There was diffuse colonic diverticulosis without evidence of acute diverticulitis.  Large hiatal hernia.  Aortic atherosclerosis.  Subjective:  Feeling better today, n/v has resolved, has a bm with dark stool this am    Assessment & Plan:  Principal Problem:   Acute lower GI bleeding Active Problems:   GERD (gastroesophageal reflux disease)   Diverticulosis of intestine with bleeding   Acute blood loss anemia   Hyperlipidemia   Aortic atherosclerosis (HCC)   Acute blood loss anemia likely from diverticular bleed -She denies abdominal pain -s/p 2 units PRBC transfusion -Seen by [facility] who recommended continue IV PPI for now -Diet advancement per GI    GERD/large hiatal hernia Continue PPI Follow-up with GI  N/V Negative KUB Increase activity    Hyperlipidemia/aortic atherosclerosis Continue statin    : Body mass index is 31.45 kg/m.,  Meet obesity criteria .  I have Reviewed nursing notes, Vitals, pain scores, I/o's, Lab results and  imaging results since pt's last encounter, details please see discussion above  I ordered the following labs:  Unresulted Labs (From admission, onward)     Start     Ordered   10/30/21 0623  Basic metabolic panel  Tomorrow morning,   R       Question:  Specimen collection method  Answer:  Lab=Lab collect   10/29/21 1139   10/28/21 1734  Hemoglobin and hematocrit, blood  5A & 5P,   R (with TIMED occurrences)     Question:  Specimen collection method  Answer:  Lab=Lab collect   10/28/21 1733             DVT prophylaxis:  SCDs Start: 10/25/21 1254   Code Status:   Code Status: Full Code  Family Communication: Patient Disposition:    Dispo: The patient is from: home, lives alone              Anticipated d/c is to: Home              Anticipated d/c date is: if no more bleeding and hgb stable, need to advance diet, needs clearance  by GI  Antimicrobials:   Anti-infectives (From admission, onward)    None           Objective: Vitals:   10/27/21 2046 10/28/21 1609 10/28/21 2007 10/29/21 0342  BP: 135/69 137/78 111/69 130/70  Pulse: 78 78 76 76  Resp: '18 19 20 14  '$ Temp: 98.2 F (36.8 C)  98.6 F (37 C) 98.1 F (36.7 C)  TempSrc: Oral   Oral  SpO2: 96% 96% 94% 96%  Weight:      Height:        Intake/Output Summary (Last 24 hours) at 10/29/2021 1140 Last data filed at 10/29/2021 0900 Gross per 24 hour  Intake 1140 ml  Output --  Net 1140 ml   Filed Weights   10/25/21 0954  Weight: 85.7 kg    Examination:  General exam: alert, awake, communicative,calm, NAD Respiratory system: Clear to auscultation. Respiratory effort normal. Cardiovascular system:  RRR.  Gastrointestinal system: Abdomen is nondistended, soft  and nontender.  Normal bowel sounds heard. Central nervous system: Alert and oriented. No focal neurological deficits. Extremities:  no edema Skin: No rashes, lesions or ulcers Psychiatry: Judgement and insight appear normal. Mood & affect appropriate.     Data Reviewed: I have personally reviewed  labs and visualized  imaging studies since the last encounter and formulate the plan        Scheduled Meds:  sodium chloride   Intravenous Once   atorvastatin  10 mg Oral Daily   mouth rinse  15 mL Mouth Rinse BID   pantoprazole (PROTONIX) IV  40 mg Intravenous Q12H   polyethylene glycol  17 g Oral Daily   potassium chloride  40 mEq Oral Once   Continuous Infusions:     LOS: 4 days   Florencia Reasons, MD PhD FACP [facility]  Available via Epic secure chat  7am-7pm for nonurgent issues Please page for urgent issues To page the attending provider between 7A-7P or the covering provider during after hours 7P-7A, please log into the web site [email or web address] and access using universal [place] password for that web site. If you do not have the password, please call the hospital operator.    10/29/2021, 11:40 AM

## 2021-10-29 NOTE — Plan of Care (Signed)

## 2021-10-30 DIAGNOSIS — K922 Gastrointestinal hemorrhage, unspecified: Secondary | ICD-10-CM | POA: Diagnosis not present

## 2021-10-30 LAB — BASIC METABOLIC PANEL
Anion gap: 6 (ref 5–15)
BUN: 9 mg/dL (ref 8–23)
CO2: 26 mmol/L (ref 22–32)
Calcium: 8.4 mg/dL — ABNORMAL LOW (ref 8.9–10.3)
Chloride: 109 mmol/L (ref 98–111)
Creatinine, Ser: 0.68 mg/dL (ref 0.44–1.00)
GFR, Estimated: >60 mL/min (ref >60)
Glucose, Bld: 99 mg/dL (ref 70–99)
Potassium: 3.7 mmol/L (ref 3.5–5.1)
Sodium: 141 mmol/L (ref 135–145)

## 2021-10-30 LAB — CBC WITH DIFFERENTIAL/PLATELET
Abs Immature Granulocytes: 0.03 10*3/uL (ref 0.00–0.07)
Basophils Absolute: 0.1 10*3/uL (ref 0.0–0.1)
Basophils Relative: 1 %
Eosinophils Absolute: 0.5 10*3/uL (ref 0.0–0.5)
Eosinophils Relative: 6 %
HCT: 25.5 % — ABNORMAL LOW (ref 36.0–46.0)
Hemoglobin: 8.1 g/dL — ABNORMAL LOW (ref 12.0–15.0)
Immature Granulocytes: 0 %
Lymphocytes Relative: 25 %
Lymphs Abs: 2.2 10*3/uL (ref 0.7–4.0)
MCH: 29.6 pg (ref 26.0–34.0)
MCHC: 31.8 g/dL (ref 30.0–36.0)
MCV: 93.1 fL (ref 80.0–100.0)
Monocytes Absolute: 0.5 10*3/uL (ref 0.1–1.0)
Monocytes Relative: 6 %
Neutro Abs: 5.5 10*3/uL (ref 1.7–7.7)
Neutrophils Relative %: 62 %
Platelets: 345 10*3/uL (ref 150–400)
RBC: 2.74 MIL/uL — ABNORMAL LOW (ref 3.87–5.11)
RDW: 14.5 % (ref 11.5–15.5)
WBC: 8.8 10*3/uL (ref 4.0–10.5)
nRBC: 0.2 % (ref 0.0–0.2)

## 2021-10-30 MED ORDER — POLYETHYLENE GLYCOL 3350 17 G PO PACK: 17.0000 g | PACK | Freq: Every day | ORAL | 0 refills | Status: AC

## 2021-10-30 NOTE — Discharge Summary (Signed)
Discharge Summary  [name] [name] GLO:[id number] DOB: 01-07-1951  PCP: [name], MD  Admit date: 10/25/2021 Discharge date: 10/30/2021  Time spent: 34mns  Recommendations for Outpatient Follow-up:  F/u with PCP within a week  for hospital discharge follow up, repeat cbc/bmp at follow up F/u with GI Dr MWatt Climes Discharge Diagnoses:  Active Hospital Problems   Diagnosis Date Noted   Acute lower GI bleeding 10/25/2021   GERD (gastroesophageal reflux disease) 10/25/2021   Diverticulosis of intestine with bleeding 10/25/2021   Acute blood loss anemia 10/25/2021   Hyperlipidemia 10/25/2021   Aortic atherosclerosis ([facility] 10/25/2021    Resolved Hospital Problems  No resolved problems to display.    Discharge Condition: stable  Diet recommendation: soft diet , advance diet as tolerated   Filed Weights   10/25/21 0954  Weight: 85.7 kg    History of present illness:  H/ o hyperlipidemia, history of breast cancer, mastectomy, macular degeneration,  GERD,  who is coming to the emergency department due to  profuse hematochezia  associated with lightheadedness   CTA GI bleed with no evidence of acute abnormalities within the abdomen or pelvis.  There was diffuse colonic diverticulosis without evidence of acute diverticulitis.  Large hiatal hernia.  Aortic atherosclerosis  Hospital Course:  Principal Problem:   Acute lower GI bleeding Active Problems:   GERD (gastroesophageal reflux disease)   Diverticulosis of intestine with bleeding   Acute blood loss anemia   Hyperlipidemia   Aortic atherosclerosis (HCC)  Acute blood loss anemia likely from diverticular bleed -She denies abdominal pain -s/p 2 units PRBC transfusion -seen by [facility], diet advancement per GI -bleeding seem has spontaneously resolved, hgb has been stable above 8, she appears to tolerated diet advancement -she is eager to go home and she is cleared to discharge home by GI, she is to f/u with GI       GERD/large hiatal hernia Continue PPI Follow-up with GI   N/V Negative KUB Increase activity  Resolved      Hyperlipidemia/aortic atherosclerosis Continue statin F/u with pcp       : Body mass index is 31.45 kg/m.,  Meet obesity criteria .  Discharge Exam: BP 137/76 (BP Location: Left Arm)   Pulse 89   Temp 98.3 F (36.8 C) (Oral)   Resp 18   Ht '5\' 5"'$  (1.651 m)   Wt 85.7 kg   SpO2 95%   BMI 31.45 kg/m   General: NAD, pleasant  Cardiovascular: RRR Respiratory: normal respiratory effort     Discharge Instructions     Diet general   Complete by: As directed    Soft diet, advance diet as tolerated   Increase activity slowly   Complete by: As directed       Allergies as of 10/30/2021   No Known Allergies      Medication List     STOP taking these medications    HYDROcodone-acetaminophen 5-325 MG tablet Commonly known as: Norco       TAKE these medications    atorvastatin 10 MG tablet Commonly known as: LIPITOR Take 10 mg by mouth daily.   esomeprazole 40 MG capsule Commonly known as: NEXIUM Take 40 mg by mouth daily at 12 noon.   multivitamin with minerals tablet Take 1 tablet by mouth daily.   EYE VITAMINS & MINERALS PO Take 1 tablet by mouth daily.   polyethylene glycol 17 g packet Commonly known as: MIRALAX / GLYCOLAX Take 17 g by mouth daily.  No Known Allergies  Follow-up Information     [name], MD Follow up.   Specialty: Family Medicine Why: hospital discharge follow up, repeat cbc/bmp at hospital discharge follow up pcp and gi to continue monitor anemia Contact information: [place] [address] [phone]         Clarene Essex, MD Follow up.   Specialty: Gastroenterology Contact information: [address] [place] [address] [phone]                  The results of significant diagnostics from this hospitalization (including imaging,  microbiology, ancillary and laboratory) are listed below for reference.    Significant Diagnostic Studies: DG Abd 1 View  Result Date: 10/28/2021 CLINICAL DATA:  Nausea vomiting. EXAM: ABDOMEN - 1 VIEW COMPARISON:  None Available. FINDINGS: Air is seen scattered throughout nondistended small bowel and nondilated colon. Bones are diffusely demineralized. IMPRESSION: Nonobstructive bowel gas pattern. Electronically Signed   By: Misty Stanley M.D.   On: 10/28/2021 10:53   CT ANGIO GI BLEED  Result Date: 10/25/2021 CLINICAL DATA:  Bright red blood per rectum. EXAM: CTA ABDOMEN AND PELVIS WITHOUT AND WITH CONTRAST TECHNIQUE: Multidetector CT imaging of the abdomen and pelvis was performed using the standard protocol during bolus administration of intravenous contrast. Multiplanar reconstructed images and MIPs were obtained and reviewed to evaluate the vascular anatomy. RADIATION DOSE REDUCTION: This exam was performed according to the departmental dose-optimization program which includes automated exposure control, adjustment of the mA and/or kV according to patient size and/or use of iterative reconstruction technique. CONTRAST:  156m OMNIPAQUE IOHEXOL 350 MG/ML SOLN COMPARISON:  None Available. FINDINGS: VASCULAR Aorta: Normal caliber aorta without aneurysm, dissection, vasculitis or significant stenosis. Celiac: Patent without evidence of aneurysm, dissection, vasculitis or significant stenosis. SMA: Patent without evidence of aneurysm, dissection, vasculitis or significant stenosis. Renals: Both renal arteries are patent without evidence of aneurysm, dissection, vasculitis, fibromuscular dysplasia or significant stenosis. IMA: Patent without evidence of aneurysm, dissection, vasculitis or significant stenosis. Inflow: Patent without evidence of aneurysm, dissection, vasculitis or significant stenosis. Proximal Outflow: Bilateral common femoral and visualized portions of the superficial and profunda femoral  arteries are patent without evidence of aneurysm, dissection, vasculitis or significant stenosis. Veins: No obvious venous abnormality within the limitations of this arterial phase study. Review of the MIP images confirms the above findings. NON-VASCULAR Lower chest: Large hiatal hernia. Hepatobiliary: No focal liver abnormality is seen. No gallstones, gallbladder wall thickening, or biliary dilatation. Pancreas: Unremarkable. No pancreatic ductal dilatation or surrounding inflammatory changes. Spleen: Normal in size without focal abnormality. Adrenals/Urinary Tract: Adrenal glands are unremarkable. Kidneys are without renal calculi, or hydronephrosis. 1 cm exophytic mass off of the upper pole of the left kidney, too small to be actually characterized. Bladder is unremarkable. Stomach/Bowel: Stomach is within normal limits. No evidence of appendicitis. No evidence of bowel wall thickening, distention, or inflammatory changes. Diffuse colonic diverticulosis without evidence of acute diverticulitis. Lymphatic: Aortic atherosclerosis. No enlarged abdominal or pelvic lymph nodes. Reproductive: Status post hysterectomy. No adnexal masses. Other: No abdominal wall hernia or abnormality. No abdominopelvic ascites. Musculoskeletal:  L4-L5 spondylosis. IMPRESSION: 1. No evidence of acute abnormalities within the abdomen or pelvis. 2. Diffuse colonic diverticulosis without evidence of acute diverticulitis. 3. Large hiatal hernia. Aortic Atherosclerosis (ICD10-I70.0). Electronically Signed   By: DFidela SalisburyM.D.   On: 10/25/2021 12:01    Microbiology: No results found for this or any previous visit (from the past 240  hour(s)).   Labs: Basic Metabolic Panel: Recent Labs  Lab 10/25/21 1822 10/27/21 0409 10/28/21 0653 10/29/21 0446 10/30/21 0418  NA 142 142 141 140 141  K 3.7 3.5 3.2* 3.4* 3.7  CL 114* 112* 109 109 109  CO2 21* '23 25 29 26  '$ GLUCOSE 120* 113* 103* 126* 99  BUN 14 9 7* 9 9  CREATININE  0.59 0.49 0.56 0.70 0.68  CALCIUM 8.0* 7.9* 8.4* 8.4* 8.4*  MG  --  2.2  --  2.3  --    Liver Function Tests: Recent Labs  Lab 10/25/21 1008  AST 17  ALT 16  ALKPHOS 67  BILITOT 0.5  PROT 6.7  ALBUMIN 3.8   Recent Labs  Lab 10/25/21 1008  LIPASE 40   No results for input(s): "AMMONIA" in the last 168 hours. CBC: Recent Labs  Lab 10/25/21 1008 10/25/21 1025 10/26/21 0244 10/26/21 1729 10/27/21 0409 10/27/21 1548 10/28/21 0653 10/28/21 1833 10/28/21 2256 10/29/21 0446 10/30/21 0418  WBC 9.5  --  9.1 7.4 7.3  --   --   --   --  7.4 8.8  NEUTROABS 6.5  --   --   --   --   --   --   --   --   --  5.5  HGB 8.9*   < > 9.3* 9.4* 7.8*   < > 8.2* 8.4* 8.6* 8.0* 8.1*  HCT 27.4*   < > 28.6* 28.8* 24.4*   < > 25.2* 26.0* 26.5* 24.7* 25.5*  MCV 91.6  --  91.7 91.1 93.8  --   --   --   --  93.9 93.1  PLT 357  --  292 294 245  --   --   --   --  306 345   < > = values in this interval not displayed.   Cardiac Enzymes: No results for input(s): "CKTOTAL", "CKMB", "CKMBINDEX", "TROPONINI" in the last 168 hours. BNP: BNP (last 3 results) No results for input(s): "BNP" in the last 8760 hours.  ProBNP (last 3 results) No results for input(s): "PROBNP" in the last 8760 hours.  CBG: No results for input(s): "GLUCAP" in the last 168 hours.  FURTHER DISCHARGE INSTRUCTIONS:   Get Medicines reviewed and adjusted: Please take all your medications with you for your next visit with your Primary MD   Laboratory/radiological data: Please request your Primary MD to go over all hospital tests and procedure/radiological results at the follow up, please ask your Primary MD to get all Hospital records sent to his/her office.   In some cases, they will be blood work, cultures and biopsy results pending at the time of your discharge. Please request that your primary care M.D. goes through all the records of your hospital data and follows up on these results.   Also Note the following: If  you experience worsening of your admission symptoms, develop shortness of breath, life threatening emergency, suicidal or homicidal thoughts you must seek medical attention immediately by calling 911 or calling your MD immediately  if symptoms less severe.   You must read complete instructions/literature along with all the possible adverse reactions/side effects for all the Medicines you take and that have been prescribed to you. Take any new Medicines after you have completely understood and accpet all the possible adverse reactions/side effects.    Do not drive when taking Pain medications or sleeping medications (Benzodaizepines)   Do not take more than prescribed Pain, Sleep and Anxiety Medications.  It is not advisable to combine anxiety,sleep and pain medications without talking with your primary care practitioner   Special Instructions: If you have smoked or chewed Tobacco  in the last 2 yrs please stop smoking, stop any regular Alcohol  and or any Recreational drug use.   Wear Seat belts while driving.   Please note: You were cared for by a hospitalist during your hospital stay. Once you are discharged, your primary care physician will handle any further medical issues. Please note that NO REFILLS for any discharge medications will be authorized once you are discharged, as it is imperative that you return to your primary care physician (or establish a relationship with a primary care physician if you do not have one) for your post hospital discharge needs so that they can reassess your need for medications and monitor your lab values.     Signed:  Florencia Reasons MD, PhD, FACP  [facility] 10/30/2021, 11:06 AM

## 2021-10-30 NOTE — Progress Notes (Signed)
[name] [name] 10:05 AM  Subjective: Patient doing well with only dark bowel movement and no new complaints and no GI complaints and her bowels are pretty solid and she is eating well  Objective: Vital signs stable afebrile no acute distress abdomen is soft nontender hemoglobin stable BUN and creatinine okay  Assessment: Seemingly resolved diverticular bleeding  Plan: Okay with me to go home happy to see back as needed or in a few weeks in the office to recheck labs symptoms and make sure repeat colonoscopy or other tests is not needed  [facility] E  office [phone] After 5PM or if no answer call [phone]

## 2021-11-03 DIAGNOSIS — E559 Vitamin D deficiency, unspecified: Secondary | ICD-10-CM | POA: Diagnosis not present

## 2021-11-03 DIAGNOSIS — K922 Gastrointestinal hemorrhage, unspecified: Secondary | ICD-10-CM | POA: Diagnosis not present

## 2021-11-03 DIAGNOSIS — G4733 Obstructive sleep apnea (adult) (pediatric): Secondary | ICD-10-CM | POA: Diagnosis not present

## 2021-11-03 DIAGNOSIS — E782 Mixed hyperlipidemia: Secondary | ICD-10-CM | POA: Diagnosis not present

## 2021-11-15 ENCOUNTER — Observation Stay (HOSPITAL_COMMUNITY)
Admission: EM | Admit: 2021-11-15 | Discharge: 2021-11-16 | Disposition: A | Discharge status: Home / Self Care | Payer: Medicare PPO | Attending: Family Medicine | Admitting: Family Medicine

## 2021-11-15 ENCOUNTER — Observation Stay (HOSPITAL_COMMUNITY): Payer: Medicare PPO

## 2021-11-15 ENCOUNTER — Emergency Department (HOSPITAL_COMMUNITY): Payer: Medicare PPO

## 2021-11-15 ENCOUNTER — Encounter (HOSPITAL_COMMUNITY): Payer: Self-pay | Admitting: Emergency Medicine

## 2021-11-15 ENCOUNTER — Other Ambulatory Visit: Payer: Self-pay

## 2021-11-15 DIAGNOSIS — D509 Iron deficiency anemia, unspecified: Secondary | ICD-10-CM | POA: Diagnosis not present

## 2021-11-15 DIAGNOSIS — Z8719 Personal history of other diseases of the digestive system: Secondary | ICD-10-CM

## 2021-11-15 DIAGNOSIS — I10 Essential (primary) hypertension: Secondary | ICD-10-CM | POA: Diagnosis not present

## 2021-11-15 DIAGNOSIS — E782 Mixed hyperlipidemia: Secondary | ICD-10-CM

## 2021-11-15 DIAGNOSIS — R030 Elevated blood-pressure reading, without diagnosis of hypertension: Secondary | ICD-10-CM

## 2021-11-15 DIAGNOSIS — E538 Deficiency of other specified B group vitamins: Secondary | ICD-10-CM | POA: Diagnosis not present

## 2021-11-15 DIAGNOSIS — R55 Syncope and collapse: Secondary | ICD-10-CM | POA: Diagnosis not present

## 2021-11-15 DIAGNOSIS — K219 Gastro-esophageal reflux disease without esophagitis: Secondary | ICD-10-CM | POA: Diagnosis present

## 2021-11-15 DIAGNOSIS — E785 Hyperlipidemia, unspecified: Secondary | ICD-10-CM | POA: Diagnosis present

## 2021-11-15 DIAGNOSIS — I639 Cerebral infarction, unspecified: Secondary | ICD-10-CM

## 2021-11-15 DIAGNOSIS — Z20822 Contact with and (suspected) exposure to covid-19: Secondary | ICD-10-CM | POA: Diagnosis not present

## 2021-11-15 DIAGNOSIS — R4701 Aphasia: Secondary | ICD-10-CM

## 2021-11-15 DIAGNOSIS — R29898 Other symptoms and signs involving the musculoskeletal system: Secondary | ICD-10-CM | POA: Diagnosis not present

## 2021-11-15 DIAGNOSIS — M47812 Spondylosis without myelopathy or radiculopathy, cervical region: Secondary | ICD-10-CM | POA: Diagnosis not present

## 2021-11-15 DIAGNOSIS — Z853 Personal history of malignant neoplasm of breast: Secondary | ICD-10-CM | POA: Diagnosis not present

## 2021-11-15 DIAGNOSIS — J3489 Other specified disorders of nose and nasal sinuses: Secondary | ICD-10-CM | POA: Diagnosis not present

## 2021-11-15 DIAGNOSIS — G459 Transient cerebral ischemic attack, unspecified: Secondary | ICD-10-CM | POA: Diagnosis not present

## 2021-11-15 DIAGNOSIS — R4702 Dysphasia: Secondary | ICD-10-CM | POA: Diagnosis not present

## 2021-11-15 DIAGNOSIS — Z79899 Other long term (current) drug therapy: Secondary | ICD-10-CM | POA: Insufficient documentation

## 2021-11-15 DIAGNOSIS — R29818 Other symptoms and signs involving the nervous system: Secondary | ICD-10-CM | POA: Diagnosis not present

## 2021-11-15 DIAGNOSIS — R4781 Slurred speech: Secondary | ICD-10-CM | POA: Diagnosis not present

## 2021-11-15 DIAGNOSIS — I771 Stricture of artery: Secondary | ICD-10-CM | POA: Diagnosis not present

## 2021-11-15 DIAGNOSIS — J329 Chronic sinusitis, unspecified: Secondary | ICD-10-CM | POA: Diagnosis not present

## 2021-11-15 DIAGNOSIS — R42 Dizziness and giddiness: Secondary | ICD-10-CM | POA: Diagnosis not present

## 2021-11-15 DIAGNOSIS — Z8673 Personal history of transient ischemic attack (TIA), and cerebral infarction without residual deficits: Secondary | ICD-10-CM | POA: Diagnosis not present

## 2021-11-15 DIAGNOSIS — R29810 Facial weakness: Secondary | ICD-10-CM | POA: Diagnosis not present

## 2021-11-15 DIAGNOSIS — H538 Other visual disturbances: Secondary | ICD-10-CM | POA: Diagnosis not present

## 2021-11-15 LAB — CBC
HCT: 28.8 % — ABNORMAL LOW (ref 36.0–46.0)
HCT: 29.5 % — ABNORMAL LOW (ref 36.0–46.0)
Hemoglobin: 8.8 g/dL — ABNORMAL LOW (ref 12.0–15.0)
Hemoglobin: 9.2 g/dL — ABNORMAL LOW (ref 12.0–15.0)
MCH: 26.8 pg (ref 26.0–34.0)
MCH: 27.4 pg (ref 26.0–34.0)
MCHC: 30.6 g/dL (ref 30.0–36.0)
MCHC: 31.2 g/dL (ref 30.0–36.0)
MCV: 87.8 fL (ref 80.0–100.0)
MCV: 87.8 fL (ref 80.0–100.0)
Platelets: 479 10*3/uL — ABNORMAL HIGH (ref 150–400)
Platelets: 491 10*3/uL — ABNORMAL HIGH (ref 150–400)
RBC: 3.28 MIL/uL — ABNORMAL LOW (ref 3.87–5.11)
RBC: 3.36 MIL/uL — ABNORMAL LOW (ref 3.87–5.11)
RDW: 14.7 % (ref 11.5–15.5)
RDW: 14.8 % (ref 11.5–15.5)
WBC: 7.1 10*3/uL (ref 4.0–10.5)
WBC: 8.5 10*3/uL (ref 4.0–10.5)
nRBC: 0 % (ref 0.0–0.2)
nRBC: 0 % (ref 0.0–0.2)

## 2021-11-15 LAB — TSH: TSH: 1.11 u[IU]/mL (ref 0.350–4.500)

## 2021-11-15 LAB — COMPREHENSIVE METABOLIC PANEL
ALT: 17 U/L (ref 0–44)
AST: 18 U/L (ref 15–41)
Albumin: 3.8 g/dL (ref 3.5–5.0)
Alkaline Phosphatase: 86 U/L (ref 38–126)
Anion gap: 8 (ref 5–15)
BUN: 10 mg/dL (ref 8–23)
CO2: 22 mmol/L (ref 22–32)
Calcium: 8.9 mg/dL (ref 8.9–10.3)
Chloride: 107 mmol/L (ref 98–111)
Creatinine, Ser: 0.82 mg/dL (ref 0.44–1.00)
GFR, Estimated: >60 mL/min (ref >60)
Glucose, Bld: 119 mg/dL — ABNORMAL HIGH (ref 70–99)
Potassium: 3.7 mmol/L (ref 3.5–5.1)
Sodium: 137 mmol/L (ref 135–145)
Total Bilirubin: 0.4 mg/dL (ref 0.3–1.2)
Total Protein: 6.6 g/dL (ref 6.5–8.1)

## 2021-11-15 LAB — LIPID PANEL
Cholesterol: 158 mg/dL (ref 0–200)
HDL: 49 mg/dL (ref >40)
LDL Cholesterol: 80 mg/dL (ref 0–99)
Total CHOL/HDL Ratio: 3.2 RATIO
Triglycerides: 143 mg/dL (ref <150)
VLDL: 29 mg/dL (ref 0–40)

## 2021-11-15 LAB — URINALYSIS, ROUTINE W REFLEX MICROSCOPIC
Bilirubin Urine: NEGATIVE
Glucose, UA: NEGATIVE mg/dL
Hgb urine dipstick: NEGATIVE
Ketones, ur: NEGATIVE mg/dL
Leukocytes,Ua: NEGATIVE
Nitrite: NEGATIVE
Protein, ur: NEGATIVE mg/dL
Specific Gravity, Urine: 1.003 — ABNORMAL LOW (ref 1.005–1.030)
pH: 7 (ref 5.0–8.0)

## 2021-11-15 LAB — CK: Total CK: 42 U/L (ref 38–234)

## 2021-11-15 LAB — TYPE AND SCREEN
ABO/RH(D): AB POS
Antibody Screen: NEGATIVE

## 2021-11-15 LAB — PHOSPHORUS: Phosphorus: 4 mg/dL (ref 2.5–4.6)

## 2021-11-15 LAB — RETICULOCYTES
Immature Retic Fract: 28.6 % — ABNORMAL HIGH (ref 2.3–15.9)
RBC.: 3.27 MIL/uL — ABNORMAL LOW (ref 3.87–5.11)
Retic Count, Absolute: 89.9 10*3/uL (ref 19.0–186.0)
Retic Ct Pct: 2.8 % (ref 0.4–3.1)

## 2021-11-15 LAB — SARS CORONAVIRUS 2 BY RT PCR: SARS Coronavirus 2 by RT PCR: NEGATIVE

## 2021-11-15 LAB — PROTIME-INR
INR: 1.1 (ref 0.8–1.2)
Prothrombin Time: 13.6 seconds (ref 11.4–15.2)

## 2021-11-15 LAB — MAGNESIUM: Magnesium: 2.2 mg/dL (ref 1.7–2.4)

## 2021-11-15 LAB — FOLATE: Folate: 30.4 ng/mL (ref >5.9)

## 2021-11-15 MED ORDER — SODIUM CHLORIDE 0.9 % IV SOLN: INTRAVENOUS | Status: DC

## 2021-11-15 MED ORDER — ATORVASTATIN CALCIUM 40 MG PO TABS
40.0000 mg | ORAL_TABLET | Freq: Every day | ORAL | Status: DC
  Administered 2021-11-15: 40 mg via ORAL
  Filled 2021-11-15: qty 1

## 2021-11-15 MED ORDER — PANTOPRAZOLE SODIUM 40 MG PO TBEC
40.0000 mg | DELAYED_RELEASE_TABLET | Freq: Every day | ORAL | Status: DC
  Administered 2021-11-16: 40 mg via ORAL
  Filled 2021-11-15: qty 1

## 2021-11-15 MED ORDER — ACETAMINOPHEN 650 MG RE SUPP: 650.0000 mg | RECTAL | Status: DC | PRN

## 2021-11-15 MED ORDER — ACETAMINOPHEN 325 MG PO TABS: 650.0000 mg | ORAL_TABLET | ORAL | Status: DC | PRN

## 2021-11-15 MED ORDER — IOHEXOL 350 MG/ML SOLN
75.0000 mL | Freq: Once | INTRAVENOUS | Status: AC | PRN
  Administered 2021-11-15: 75 mL via INTRAVENOUS

## 2021-11-15 MED ORDER — STROKE: EARLY STAGES OF RECOVERY BOOK
Freq: Once | Status: AC
  Filled 2021-11-15: qty 1

## 2021-11-15 MED ORDER — ATORVASTATIN CALCIUM 10 MG PO TABS: 10.0000 mg | ORAL_TABLET | Freq: Every day | ORAL | Status: DC

## 2021-11-15 MED ORDER — PANTOPRAZOLE SODIUM 40 MG PO TBEC: 40.0000 mg | DELAYED_RELEASE_TABLET | Freq: Every day | ORAL | Status: DC

## 2021-11-15 MED ORDER — ACETAMINOPHEN 160 MG/5ML PO SOLN: 650.0000 mg | ORAL | Status: DC | PRN

## 2021-11-15 NOTE — Assessment & Plan Note (Signed)
Obtain anemia panel   may need iron infusion prior to DC  will need to touch base with GI given persistent anemia and possible need for asprin

## 2021-11-15 NOTE — Consult Note (Signed)
Neurology Consultation  Reason for Consult: Speech difficulty Referring Physician: Dr. Adela Glimpse  CC: Speech difficulty  History is obtained from: Patient, chart  HPI: [name] [name] is a 71 y.o. female past medical history of recent acute lower GI bleed, prior history of breast cancer and mastectomy hyperlipidemia, presented to the emergency room for evaluation of an episode of word finding difficulty, nausea, lightheadedness and blurred vision. She reports that around 6:30 AM was her last known normal and at breakfast around 8 AM she felt nauseated and lightheaded and then started to have some blurry vision.  She called her work and noticed that she was having trouble with getting her words out.  Symptoms lasted for a little over an hour and nearly resolved although she still feels that she has some subtle word finding difficulty as noticed by her son over the phone. She is currently working as a Diplomatic Services operational officer.  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 She has a recent admission for rectal bleeding and has had no bleeding since discharge but the hemoglobin remains low at about 8.  She is not on antiplatelets or anticoagulant   LKW: 6:30 AM IV thrombolysis given?: no, symptoms resolved/low NIH Premorbid modified Rankin scale (mRS): 0  ROS: Full ROS was performed and is negative except as noted in the HPI.   Past Medical History:  Diagnosis Date   Acute lower GI bleeding 10/25/2021   Aortic atherosclerosis (HCC) 10/25/2021   Cancer (HCC)    Diverticulosis of intestine with bleeding 10/25/2021   GERD (gastroesophageal reflux disease) 10/25/2021   Hyperlipidemia 10/25/2021   Macular degeneration    PONV (postoperative nausea and vomiting)      Family History  Problem Relation Age of Onset   Stomach cancer Father    Dementia Paternal Grandmother    Stroke Paternal Grandfather    BRCA 1/2 Neg Hx    Breast cancer  Neg Hx      Social History:   reports that she has never smoked. She has never used smokeless tobacco. She reports that she does not drink alcohol and does not use drugs.  Medications  Current Facility-Administered Medications:    [START ON 11/16/2021]  stroke: early stages of recovery book, , Does not apply, Once, Doutova, Anastassia, MD   0.9 %  sodium chloride infusion, , Intravenous, Continuous, Doutova, Anastassia, MD, Last Rate: 75 mL/hr at 11/15/21 2019, New Bag at 11/15/21 2019   acetaminophen (TYLENOL) tablet 650 mg, 650 mg, Oral, Q4H PRN **OR** acetaminophen (TYLENOL) 160 MG/5ML solution 650 mg, 650 mg, Per Tube, Q4H PRN **OR** acetaminophen (TYLENOL) suppository 650 mg, 650 mg, Rectal, Q4H PRN, Doutova, Anastassia, MD   atorvastatin (LIPITOR) tablet 10 mg, 10 mg, Oral, QHS, Doutova, Anastassia, MD   [START ON 11/16/2021] pantoprazole (PROTONIX) EC tablet 40 mg, 40 mg, Oral, Daily, Doutova, Anastassia, MD  Current Outpatient Medications:    atorvastatin (LIPITOR) 10 MG tablet, Take 10 mg by mouth at bedtime., Disp: , Rfl:    CLARITIN 10 MG tablet, Take 10 mg by mouth daily as needed (for seasonal allergies)., Disp: , Rfl:    fluticasone (FLONASE) 50 MCG/ACT nasal spray, Place 1-2 sprays into both nostrils daily as needed (for seasonal allergies)., Disp: , Rfl:    NEXIUM 24HR 20 MG capsule, Take 20 mg by mouth daily before breakfast., Disp: , Rfl:    OVER THE COUNTER MEDICATION, Take 1  capsule by mouth See admin instructions. Andrew Lessman's Ultimate Eye Support with Astaxanthin capsules- Take 1 capsule by mouth every morning, Disp: , Rfl:    OVER THE COUNTER MEDICATION, Take 1 capsule by mouth See admin instructions. Tiburcio Pea Essential-1 with Vitamin D3-2000 capsules- Take 1 capsule by mouth once a day after breakfast, Disp: , Rfl:    polyethylene glycol (MIRALAX / GLYCOLAX) 17 g packet, Take 17 g by mouth daily. (Patient taking differently: Take 17 g by mouth every evening.),  Disp: 14 each, Rfl: 0   Vitamin D, Ergocalciferol, (DRISDOL) 1.25 MG (50000 UNIT) CAPS capsule, Take 50,000 Units by mouth See admin instructions. Take 50,000 units by mouth every Wednesday and Saturday, Disp: , Rfl:    Exam: Current vital signs: BP (!) 147/76   Pulse 89   Temp 98.1 F (36.7 C) (Oral)   Resp 17   SpO2 95%  Vital signs in last 24 hours: Temp:  [98.1 F (36.7 C)] 98.1 F (36.7 C) (06/27 1537) Pulse Rate:  [86-102] 89 (06/27 2015) Resp:  [12-23] 17 (06/27 2015) BP: (120-162)/(61-91) 147/76 (06/27 2015) SpO2:  [94 %-99 %] 95 % (06/27 2015)  GENERAL: Awake, alert in NAD HEENT: - Normocephalic and atraumatic, dry mm, no LN++, no Thyromegally LUNGS - Clear to auscultation bilaterally with no wheezes CV - S1S2 RRR, no m/r/g, equal pulses bilaterally. ABDOMEN - Soft, nontender, nondistended with normoactive BS Ext: warm, well perfused, intact peripheral pulses no edema  NEURO:  Mental Status: AA&Ox3  Language: speech is nondysarthric.  Naming repetition and comprehension are intact but there is some loss of fluency with longer sentences. Cranial Nerves: PERRL  EOMI, visual fields full, subtle flattening of the right nasolabial fold at rest but symmetric when smiling, facial sensation intact, hearing intact, tongue/uvula/soft palate midline, normal sternocleidomastoid and trapezius muscle strength. No evidence of tongue atrophy or fibrillations Motor: 5/5 with no vertical drift Tone: is normal and bulk is normal Sensation- Intact to light touch bilaterally, no extinction Coordination: FTN intact bilaterally, no ataxia in BLE. Gait- deferred  NIHSS-2 (one-point each for face and  language)  Labs I have reviewed labs in epic and the results pertinent to this consultation are:  CBC    Component Value Date/Time   WBC 8.5 11/15/2021 2017   RBC 3.36 (L) 11/15/2021 2017   HGB 9.2 (L) 11/15/2021 2017   HCT 29.5 (L) 11/15/2021 2017   PLT 491 (H) 11/15/2021 2017   MCV  87.8 11/15/2021 2017   MCH 27.4 11/15/2021 2017   MCHC 31.2 11/15/2021 2017   RDW 14.8 11/15/2021 2017   LYMPHSABS 2.2 10/30/2021 0418   MONOABS 0.5 10/30/2021 0418   EOSABS 0.5 10/30/2021 0418   BASOSABS 0.1 10/30/2021 0418    CMP     Component Value Date/Time   NA 137 11/15/2021 1130   K 3.7 11/15/2021 1130   CL 107 11/15/2021 1130   CO2 22 11/15/2021 1130   GLUCOSE 119 (H) 11/15/2021 1130   BUN 10 11/15/2021 1130   CREATININE 0.82 11/15/2021 1130   CALCIUM 8.9 11/15/2021 1130   PROT 6.6 11/15/2021 1130   ALBUMIN 3.8 11/15/2021 1130   AST 18 11/15/2021 1130   ALT 17 11/15/2021 1130   ALKPHOS 86 11/15/2021 1130   BILITOT 0.4 11/15/2021 1130   GFRNONAA >60 11/15/2021 1130   Imaging I have reviewed the images obtained:  CT-head no acute changes  MRI examination of the brain-official reading with subtle 7 mm area of artifact versus  history of diffusion in the right posterior frontal lobe but I have personally reviewed images and I do not think that the right hemispheric area read as a stroke is truly a stroke which I also briefly had one of the on-call neuroradiologist take a look who agrees with me.  Assessment: 71 year old woman with above past medical history with an episode of word finding difficulty nausea and lightheadedness and some blurred vision-given the speech difficulties, as well as subtle right nasolabial fold flattening-localizable to left cerebral hemisphere.    MRI concerning for possible artifact versus stroke in the right hemisphere which I think truly is an artifact-see MRI review above.  I still think she would benefit from work-up for left hemispheric TIA versus MRI negative stroke in the left hemisphere-based on her symptomatology and exam findings.  History of recent lower GI bleed with low hemoglobin-we will hold antiplatelets for now  Impression: Left hemispheric TIA versus MRI negative stroke Right hemispheric stroke read on MRI by radiology is  likely artifactual and not a true stroke. Recent lower GI bleed-hemoglobin still in the eights. Hyperlipidemia  Recommendations: Admit to hospitalist Frequent neurochecks Telemetry CTA head and neck 2D echo A1c Lipid panel I would hold off on aspirin given the recent GI bleed.  Please curbside GI in the morning to see if it is okay to start antiplatelets. High intensity statin-on atorvastatin 10 at home.  I would at least go up to atorvastatin 40. PT OT Speech therapy Permissive hypertension-allow for permissive hypertension and treat only if systolic blood pressures greater than 220 for the next 48 to 72 hours.  Eventual blood pressure goal 140/90 or below.  Discussed my plan in detail with Dr. Adela Glimpse over the secure chat and phone. Discussed my plan in detail with the patient at bedside. Stroke team to follow with you.  -- Milon Dikes, MD Neurologist [facility] Pager: [phone]

## 2021-11-15 NOTE — ED Provider Notes (Signed)
  Physical Exam  BP 136/78   Pulse 88   Temp 98.1 F (36.7 C) (Oral)   Resp 12   SpO2 97%     Procedures  Procedures  ED Course / MDM    Medical Decision Making Amount and/or Complexity of Data Reviewed Labs: ordered. Radiology: ordered. ECG/medicine tests: ordered.  Risk Decision regarding hospitalization.   57F presenting with TIA concern, expressive aphasia. No deficits on exam. Follow-up neuro consult. Waiting on MRI, DC if negative.  68 Spoke with Dr. Cheral Marker, will plan for neuro consult for TIA.  MRI in process.  MRI concerning for possible acute stroke with a subtle slight a 7 mm acute infarct versus artifact within the posterior right frontal lobe subcortical white matter. Dr. Roel Cluck consulted for admission for further stroke workup. Neuro recommendations pending at time of admission.        Regan Lemming, MD 11/16/21 [phone]

## 2021-11-15 NOTE — Assessment & Plan Note (Addendum)
Continue Protonix 40 mg po qd

## 2021-11-15 NOTE — ED Notes (Signed)
Patient transported to MRI 

## 2021-11-16 ENCOUNTER — Observation Stay (HOSPITAL_BASED_OUTPATIENT_CLINIC_OR_DEPARTMENT_OTHER): Payer: Medicare PPO

## 2021-11-16 DIAGNOSIS — K922 Gastrointestinal hemorrhage, unspecified: Secondary | ICD-10-CM | POA: Diagnosis not present

## 2021-11-16 DIAGNOSIS — D509 Iron deficiency anemia, unspecified: Secondary | ICD-10-CM | POA: Diagnosis not present

## 2021-11-16 DIAGNOSIS — Z79899 Other long term (current) drug therapy: Secondary | ICD-10-CM | POA: Diagnosis not present

## 2021-11-16 DIAGNOSIS — G459 Transient cerebral ischemic attack, unspecified: Secondary | ICD-10-CM | POA: Diagnosis not present

## 2021-11-16 DIAGNOSIS — E782 Mixed hyperlipidemia: Secondary | ICD-10-CM | POA: Diagnosis not present

## 2021-11-16 DIAGNOSIS — Z8719 Personal history of other diseases of the digestive system: Secondary | ICD-10-CM

## 2021-11-16 DIAGNOSIS — E538 Deficiency of other specified B group vitamins: Secondary | ICD-10-CM

## 2021-11-16 DIAGNOSIS — Z20822 Contact with and (suspected) exposure to covid-19: Secondary | ICD-10-CM | POA: Diagnosis not present

## 2021-11-16 DIAGNOSIS — Z853 Personal history of malignant neoplasm of breast: Secondary | ICD-10-CM | POA: Diagnosis not present

## 2021-11-16 LAB — IRON AND TIBC
Iron: 13 ug/dL — ABNORMAL LOW (ref 28–170)
Saturation Ratios: 3 % — ABNORMAL LOW (ref 10.4–31.8)
TIBC: 493 ug/dL — ABNORMAL HIGH (ref 250–450)
UIBC: 480 ug/dL

## 2021-11-16 LAB — COMPREHENSIVE METABOLIC PANEL
ALT: 15 U/L (ref 0–44)
AST: 15 U/L (ref 15–41)
Albumin: 3.6 g/dL (ref 3.5–5.0)
Alkaline Phosphatase: 78 U/L (ref 38–126)
Anion gap: 9 (ref 5–15)
BUN: 9 mg/dL (ref 8–23)
CO2: 23 mmol/L (ref 22–32)
Calcium: 8.9 mg/dL (ref 8.9–10.3)
Chloride: 107 mmol/L (ref 98–111)
Creatinine, Ser: 0.64 mg/dL (ref 0.44–1.00)
GFR, Estimated: >60 mL/min (ref >60)
Glucose, Bld: 105 mg/dL — ABNORMAL HIGH (ref 70–99)
Potassium: 3.6 mmol/L (ref 3.5–5.1)
Sodium: 139 mmol/L (ref 135–145)
Total Bilirubin: 0.4 mg/dL (ref 0.3–1.2)
Total Protein: 6.2 g/dL — ABNORMAL LOW (ref 6.5–8.1)

## 2021-11-16 LAB — CBC
HCT: 27.5 % — ABNORMAL LOW (ref 36.0–46.0)
HCT: 28.3 % — ABNORMAL LOW (ref 36.0–46.0)
Hemoglobin: 8.7 g/dL — ABNORMAL LOW (ref 12.0–15.0)
Hemoglobin: 9.1 g/dL — ABNORMAL LOW (ref 12.0–15.0)
MCH: 27.3 pg (ref 26.0–34.0)
MCH: 27.9 pg (ref 26.0–34.0)
MCHC: 31.6 g/dL (ref 30.0–36.0)
MCHC: 32.2 g/dL (ref 30.0–36.0)
MCV: 86.2 fL (ref 80.0–100.0)
MCV: 86.8 fL (ref 80.0–100.0)
Platelets: 448 10*3/uL — ABNORMAL HIGH (ref 150–400)
Platelets: 457 10*3/uL — ABNORMAL HIGH (ref 150–400)
RBC: 3.19 MIL/uL — ABNORMAL LOW (ref 3.87–5.11)
RBC: 3.26 MIL/uL — ABNORMAL LOW (ref 3.87–5.11)
RDW: 14.9 % (ref 11.5–15.5)
RDW: 14.8 % (ref 11.5–15.5)
WBC: 7 10*3/uL (ref 4.0–10.5)
WBC: 6 10*3/uL (ref 4.0–10.5)
nRBC: 0 % (ref 0.0–0.2)
nRBC: 0 % (ref 0.0–0.2)

## 2021-11-16 LAB — ECHOCARDIOGRAM COMPLETE
AR max vel: 2.48 cm2
AV Peak grad: 8.6 mmHg
Ao pk vel: 1.47 m/s
Area-P 1/2: 4.1 cm2
S' Lateral: 2.9 cm

## 2021-11-16 LAB — FERRITIN: Ferritin: 6 ng/mL — ABNORMAL LOW (ref 11–307)

## 2021-11-16 LAB — VITAMIN B12: Vitamin B-12: 779 pg/mL (ref 180–914)

## 2021-11-16 MED ORDER — SODIUM CHLORIDE 0.9 % IV SOLN
250.0000 mg | Freq: Every day | INTRAVENOUS | Status: DC
  Administered 2021-11-16: 250 mg via INTRAVENOUS
  Filled 2021-11-16: qty 20

## 2021-11-16 MED ORDER — FOLIC ACID 1 MG PO TABS
1.0000 mg | ORAL_TABLET | Freq: Every day | ORAL | 3 refills | Status: AC
Start: 2021-11-17 — End: 2022-03-17

## 2021-11-16 MED ORDER — ATORVASTATIN CALCIUM 40 MG PO TABS: 40.0000 mg | ORAL_TABLET | Freq: Every day | ORAL | 11 refills | Status: AC

## 2021-11-16 MED ORDER — ASPIRIN 81 MG PO TBEC: 81.0000 mg | DELAYED_RELEASE_TABLET | Freq: Every day | ORAL | 11 refills | Status: DC

## 2021-11-16 MED ORDER — FOLIC ACID 1 MG PO TABS
1.0000 mg | ORAL_TABLET | Freq: Every day | ORAL | Status: DC
  Administered 2021-11-16: 1 mg via ORAL
  Filled 2021-11-16: qty 1

## 2021-11-16 MED ORDER — ASPIRIN 81 MG PO TBEC
81.0000 mg | DELAYED_RELEASE_TABLET | Freq: Every day | ORAL | Status: DC
  Administered 2021-11-16: 81 mg via ORAL
  Filled 2021-11-16: qty 1

## 2021-11-16 MED ORDER — FERROUS SULFATE 325 (65 FE) MG PO TABS: 325.0000 mg | ORAL_TABLET | ORAL | 3 refills | Status: DC

## 2021-11-16 NOTE — Evaluation (Signed)
Physical Therapy Evaluation Patient Details Name: [name] [name] MRN: [id number] DOB: January 28, 1951 Today's Date: 11/16/2021  History of Present Illness  Pt is a 71 y.o. female who presented to the emergency room 6/27 for evaluation of an episode of word finding difficulty, nausea, lightheadedness and blurred vision. MRI revealed chronic lacunar infarcts within the posterior right lentiform nucleus and medial left thalamus, and also concerning for possible artifact versus stroke in the right hemisphere. Neurology believes this is most likely artifact and not a true stroke.  PMH: recent acute lower GI bleed, prior history of breast cancer and mastectomy hyperlipidemia   Clinical Impression  PT eval complete. Pt is independent with all functional mobility. 24/24 on DGI balance assessment. No strength or sensation deficits noted. Pt reports all symptoms have resolved. No further skilled PT intervention indicated. PT signing off.       Recommendations for follow up therapy are one component of a multi-disciplinary discharge planning process, led by the attending physician.  Recommendations may be updated based on patient status, additional functional criteria and insurance authorization.  Follow Up Recommendations No PT follow up      Assistance Recommended at Discharge None  Patient can return home with the following       Equipment Recommendations None recommended by PT  Recommendations for Other Services       Functional Status Assessment Patient has not had a recent decline in their functional status     Precautions / Restrictions Precautions Precautions: None      Mobility  Bed Mobility Overal bed mobility: Independent                  Transfers Overall transfer level: Independent Equipment used: None                    Ambulation/Gait Ambulation/Gait assistance: Independent Gait Distance (Feet): 350 Feet Assistive device: None Gait Pattern/deviations:  WFL(Within Functional Limits) Gait velocity: WNL Gait velocity interpretation: >4.37 ft/sec, indicative of normal walking speed   General Gait Details: steady gait  Stairs            Wheelchair Mobility    Modified Rankin (Stroke Patients Only) Modified Rankin (Stroke Patients Only) Pre-Morbid Rankin Score: No symptoms Modified Rankin: No symptoms     Balance Overall balance assessment: Independent                               Standardized Balance Assessment Standardized Balance Assessment : Dynamic Gait Index   Dynamic Gait Index Level Surface: Normal Change in Gait Speed: Normal Gait with Horizontal Head Turns: Normal Gait with Vertical Head Turns: Normal Gait and Pivot Turn: Normal Step Over Obstacle: Normal Step Around Obstacles: Normal Steps: Normal Total Score: 24       Pertinent Vitals/Pain Pain Assessment Pain Assessment: No/denies pain    Home Living Family/patient expects to be discharged to:: Private residence Living Arrangements: Alone Available Help at Discharge: Family Type of Home: House Home Access: Stairs to enter   Technical brewer of Steps: 1 (threshold)   Home Layout: One level Home Equipment: None      Prior Function Prior Level of Function : Independent/Modified Independent;Driving;Working/employed             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 Extremity Assessment Upper Extremity Assessment:  Defer to OT evaluation    Lower Extremity Assessment Lower Extremity Assessment: Overall WFL for tasks assessed    Cervical / Trunk Assessment Cervical / Trunk Assessment: Normal  Communication   Communication: No difficulties  Cognition Arousal/Alertness: Awake/alert Behavior During Therapy: WFL for tasks assessed/performed Overall Cognitive Status: Within Functional Limits for tasks assessed                                           General Comments      Exercises     Assessment/Plan    PT Assessment Patient does not need any further PT services  PT Problem List         PT Treatment Interventions      PT Goals (Current goals can be found in the Care Plan section)  Acute Rehab PT Goals Patient Stated Goal: home PT Goal Formulation: All assessment and education complete, DC therapy    Frequency       Co-evaluation               AM-PAC PT "6 Clicks" Mobility  Outcome Measure Help needed turning from your back to your side while in a flat bed without using bedrails?: None Help needed moving from lying on your back to sitting on the side of a flat bed without using bedrails?: None Help needed moving to and from a bed to a chair (including a wheelchair)?: None Help needed standing up from a chair using your arms (e.g., wheelchair or bedside chair)?: None Help needed to walk in hospital room?: None Help needed climbing 3-5 steps with a railing? : None 6 Click Score: 24    End of Session   Activity Tolerance: Patient tolerated treatment well Patient left: in chair;with call bell/phone within reach Nurse Communication: Mobility status PT Visit Diagnosis: Other symptoms and signs involving the nervous system (Z16.967)    Time: 8938-1017 PT Time Calculation (min) (ACUTE ONLY): 21 min   Charges:   PT Evaluation $PT Eval Low Complexity: 1 Low          Lorrin Goodell, PT  Office # [phone] Pager [phone]   Lorriane Shire 11/16/2021, 8:37 AM

## 2021-11-16 NOTE — Evaluation (Signed)
Occupational Therapy Evaluation Patient Details Name: [name] [name] MRN: [id number] DOB: 08/23/1950 Today's Date: 11/16/2021   History of Present Illness Pt is a 71 y.o. female who presented to the emergency room 6/27 for evaluation of an episode of word finding difficulty, nausea, lightheadedness and blurred vision. MRI revealed chronic lacunar infarcts within the posterior right lentiform nucleus and medial left thalamus, and also concerning for possible artifact versus stroke in the right hemisphere. Neurology believes this is most likely artifact and not a true stroke.  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 Pt reports visual symptoms have resolved; [facility] for tasks assessed, currently  at baseline for ADLs and able to walk hallway distance independently. Pt educated on BEFAST, pt verbalized understanding. Pt presenting with impairments listed below, however has no acute OT needs at this time, will s/o. Please reconsult if there is a change in pt status.      Recommendations for follow up therapy are one component of a multi-disciplinary discharge planning process, led by the attending physician.  Recommendations may be updated based on patient status, additional functional criteria and insurance authorization.   Follow Up Recommendations  No OT follow up    Assistance Recommended at Discharge PRN  Patient can return home with the following Assistance with cooking/housework    Functional Status Assessment  Patient has had a recent decline in their functional status and demonstrates the ability to make significant improvements in function in a reasonable and predictable amount of time.  Equipment Recommendations  None recommended by OT    Recommendations for Other  Services       Precautions / Restrictions Precautions Precautions: None Restrictions Weight Bearing Restrictions: No      Mobility Bed Mobility Overal bed mobility: Independent                  Transfers Overall transfer level: Independent Equipment used: None                      Balance Overall balance assessment: No apparent balance deficits (not formally assessed)                                         ADL either performed or assessed with clinical judgement   ADL Overall ADL's : At baseline                                       General ADL Comments: pt completing ADL/IADL/hallway distance ambulatiohn with supervision during session     Vision Baseline Vision/History: 1 Wears glasses Vision Assessment?: No apparent visual deficits;Yes Eye Alignment: Within Functional Limits Ocular Range of Motion: Within Functional Limits Alignment/Gaze Preference: Within Defined Limits Tracking/Visual Pursuits: Able to track stimulus in all quads without difficulty Convergence: Within functional limits Visual Fields: No apparent deficits Additional Comments: able to read paragraph short sentences without glasses     Perception     Praxis      Pertinent Vitals/Pain Pain Assessment Pain Assessment: No/denies pain     Hand Dominance     Extremity/Trunk Assessment Upper  Extremity Assessment Upper Extremity Assessment: Overall WFL for tasks assessed   Lower Extremity Assessment Lower Extremity Assessment: Defer to PT evaluation   Cervical / Trunk Assessment Cervical / Trunk Assessment: Normal   Communication Communication Communication: No difficulties   Cognition Arousal/Alertness: Awake/alert Behavior During Therapy: WFL for tasks assessed/performed Overall Cognitive Status: Within Functional Limits for tasks assessed                                       General Comments  VSS on RA, pt  reporting different sensation between eye brows (unable to describe), heaviness at top of head during ambulation    Exercises     Shoulder Instructions      Home Living Family/patient expects to be discharged to:: Private residence Living Arrangements: Alone Available Help at Discharge: Family Type of Home: House Home Access: Stairs to enter Technical brewer of Steps: 1 (threshold)   Home Layout: One level     Bathroom Shower/Tub: Teacher, early years/pre: Standard     Home Equipment: None          Prior Functioning/Environment Prior Level of Function : Independent/Modified Independent;Driving;Working/employed             Mobility Comments: works full-time as a Network engineer at [employer]          OT Problem List: Decreased activity tolerance      OT Treatment/Interventions:      OT Goals(Current goals can be found in the care plan section) Acute Rehab OT Goals Patient Stated Goal: to get home OT Goal Formulation: With patient Time For Goal Achievement: 11/30/21 Potential to Achieve Goals: Good  OT Frequency:      Co-evaluation              AM-PAC OT "6 Clicks" Daily Activity     Outcome Measure Help from another person eating meals?: None Help from another person taking care of personal grooming?: None Help from another person toileting, which includes using toliet, bedpan, or urinal?: None Help from another person bathing (including washing, rinsing, drying)?: None Help from another person to put on and taking off regular upper body clothing?: None Help from another person to put on and taking off regular lower body clothing?: None 6 Click Score: 24   End of Session Equipment Utilized During Treatment: Gait belt Nurse Communication: Mobility status  Activity Tolerance: Patient tolerated treatment well Patient left: in bed;with call bell/phone within reach;with bed alarm set  OT Visit Diagnosis: Muscle weakness (generalized)  (M62.81);Other symptoms and signs involving cognitive function                Time: 1001-1025 OT Time Calculation (min): 24 min Charges:  OT General Charges $OT Visit: 1 Visit OT Evaluation $OT Eval Low Complexity: 1 Low OT Treatments $Therapeutic Activity: 8-22 mins  Lynnda Child, OTD, OTR/L Acute Rehab [phone]) [phone] - [place] 11/16/2021, 11:17 AM

## 2021-11-16 NOTE — Hospital Course (Signed)
[name] [name] is [name] [name] 71 y.o. female past medical history of recent acute lower GI bleed, prior history of breast cancer and mastectomy hyperlipidemia, presented to the emergency room for evaluation of an episode of word finding difficulty, nausea, lightheadedness and blurred vision.  She had an MRI that showed chronic strokes as well as [name] [name] possible 7 mm acute infarct vs artifact (neurology feels this is most c/w artifact).  She was admitted for TIA workup.  Neuro consulted.  She's been started on aspirin and lipitor increased to 40 mg daily.    Stable at time of discharge with improved neuro symptoms.   See below for additional details

## 2021-11-16 NOTE — Assessment & Plan Note (Signed)
   Daily PPI 

## 2021-11-16 NOTE — Assessment & Plan Note (Addendum)
IV iron prior to discharge Discharge with iron three times weekly  Needs GI follow up with need for outpatient follow up

## 2021-11-16 NOTE — Progress Notes (Signed)
[facility] Gastroenterology Progress Note  ANADELIA [name] 71 y.o. April 04, 1951  CC:  Anemia, recent GI bleed   Subjective: Patient seen and examined, sitting in bedside chair and feeling well this morning.  She denies any rectal bleeding since last hospitalization.  Nausea but denies vomiting, fever, chills, abdominal pain.  Having regular bowel movements on MiraLAX.  Denies melena, diarrhea, constipation.  ROS : Review of Systems  Constitutional:  Negative for chills and fever.  Gastrointestinal:  Positive for nausea. Negative for abdominal pain, blood in stool, constipation, diarrhea, heartburn, melena and vomiting.  Genitourinary:  Negative for dysuria and urgency.      Objective: Vital signs in last 24 hours: Vitals:   11/16/21 0032 11/16/21 0817  BP: (!) 164/76 126/68  Pulse: 83 78  Resp:    Temp: 98 F (36.7 C) 98.5 F (36.9 C)  SpO2: 98% 98%    Physical Exam:  General:  Alert, cooperative, no distress, appears stated age  Head:  Normocephalic, without obvious abnormality, atraumatic  Eyes:  Anicteric sclera, EOM's intact  Lungs:   Clear to auscultation bilaterally, respirations unlabored  Heart:  Regular rate and rhythm, S1, S2 normal  Abdomen:   Soft, non-tender, bowel sounds active all four quadrants,  no masses   Extremities: Extremities normal  Pulses: 2+ and symmetric    Lab Results: Recent Labs    11/15/21 1130 11/15/21 2017 11/16/21 0254  NA 137  --  139  K 3.7  --  3.6  CL 107  --  107  CO2 22  --  23  GLUCOSE 119*  --  105*  BUN 10  --  9  CREATININE 0.82  --  0.64  CALCIUM 8.9  --  8.9  MG  --  2.2  --   PHOS  --  4.0  --    Recent Labs    11/15/21 1130 11/16/21 0254  AST 18 15  ALT 17 15  ALKPHOS 86 78  BILITOT 0.4 0.4  PROT 6.6 6.2*  ALBUMIN 3.8 3.6   Recent Labs    11/15/21 2017 11/16/21 0252  WBC 8.5 7.0  HGB 9.2* 8.7*  HCT 29.5* 27.5*  MCV 87.8 86.2  PLT 491* 448*   Recent Labs    11/15/21 2017  LABPROT 13.6  INR 1.1       Assessment Recent lower GI bleed, anemia - No recurrence of rectal bleeding since hospital discharge - Hemoglobin 8.7, normal MCV, no leukocytosis, platelets 448 - Iron 13, ferritin 6 - Normal BUN, creatinine  Left hemispheric TIA versus MRI negative stroke  Plan: No recurrence of GI bleeding since last admission. Hemoglobin remains low but stable. Okay to start aspirin from GI standpoint.  Continue daily CBC and transfuse as needed to maintain HGB > 7  Monitor for signs of overt GI bleeding. Recommend iron supplementation. Will discuss with Dr. Alessandra Bevels for any additional recommendations.  Angelique Holm PA-C 11/16/2021, 9:23 AM  Contact #  [phone]

## 2021-11-16 NOTE — Assessment & Plan Note (Signed)
Appreciate GI assistance, ok for aspirin, recommending continue PPI daily for hx large hiatal hernia Has follow up with GI outpatient

## 2021-11-16 NOTE — Evaluation (Signed)
Speech Language Pathology Evaluation Patient Details Name: [name] [name] MRN: [id number] DOB: 04-Dec-1950 Today's Date: 11/16/2021 Time: 7026-3785 SLP Time Calculation (min) (ACUTE ONLY): 28 min  Problem List:  Patient Active Problem List   Diagnosis Date Noted   Stroke ([place]) 11/15/2021   Iron deficiency anemia 11/15/2021   Acute lower GI bleeding 10/25/2021   GERD (gastroesophageal reflux disease) 10/25/2021   Diverticulosis of intestine with bleeding 10/25/2021   Acute blood loss anemia 10/25/2021   Hyperlipidemia 10/25/2021   Aortic atherosclerosis ([place]) 10/25/2021   Past Medical History:  Past Medical History:  Diagnosis Date   Acute lower GI bleeding 10/25/2021   Aortic atherosclerosis ([place]) 10/25/2021   Cancer ([place])    Diverticulosis of intestine with bleeding 10/25/2021   GERD (gastroesophageal reflux disease) 10/25/2021   Hyperlipidemia 10/25/2021   Macular degeneration    PONV (postoperative nausea and vomiting)    Past Surgical History:  Past Surgical History:  Procedure Laterality Date   ABDOMINAL HYSTERECTOMY     BREAST EXCISIONAL BIOPSY Right    BREAST LUMPECTOMY WITH RADIOACTIVE SEED LOCALIZATION Right 01/18/2016   Procedure: RIGHT BREAST LUMPECTOMY WITH RADIOACTIVE SEED LOCALIZATION;  Surgeon: Erroll Luna, MD;  Location: [place];  Service: General;  Laterality: Right;   BREAST SURGERY     MASTECTOMY Left    TURBINATE REDUCTION     HPI:  71 y.o. female with medical history significant of   class I obesity, hyperlipidemia, history of breast cancer, mastectomy, macular degeneration, diverticulosis, GERD, hyperlipidemia,           Presented with   aphasia  Patient developed nausea around 8 AM and blurred vision by 9 AM.  By the time her boss called her around 10 AM her speech was garbled and her boss called 911.  EMS arrived at 1015 and she was having trouble and answering but no other neurological symptoms eventually his speech has improved by the time  patient arrived to the ER speech was clear although she did not feel completely back to normal she felt much better on arrival blood pressure 142/72 heart rate 8896% on room air  No associated headache no nausea no vomiting currently she had a recent admission for rectal bleeding back 3 weeks ago but no recurrence; MRI brain indicated on 11/15/21 Subtle 7 mm acute infarct versus artifact within the posterior right frontal lobe subcortical white matter. 2. Background mild cerebral white matter chronic small vessel ischemic disease. 3. Chronic lacunar infarcts within the posterior right lentiform nucleus and medial left thalamus. 4. Subcentimeter chronic infarcts within the bilateral cerebellar hemispheres. Passed [facility]: Speech/language evaluation generated.   Assessment / Plan / Recommendation Clinical Impression  Pt seen for speech/language cognitive assessment determined by [name] Mental Status Examination (SLUMS) with a score obtained of 29/30 with one object unable to be recalled out of 5, but with a category cue, object was recalled.  A typical score on this assessment is 27/30 or better.  Pt stated her symptoms that brought her to the hospital including blurry vision, aphasia, etc. have all resolved.  No other needs identified during administration of test with attention, other memory tasks, orientation, auditory comprehension and verbal expression all determined to be [name].  Pt's speech was 100% intelligible within complex conversation and vocal quality normal.  Slight R facial deviation noted at rest with discoloration on L lingual tip, but these discrepancies did not affect speech production.  [facility] passed as well and pt denies swallowing difficulty at  baseline or currently.  ST not recommended in acute setting.  ST will s/o at this time.  Thank you for this consult.    SLP Assessment  SLP Recommendation/Assessment: Patient does not need any further Speech Language Pathology Services SLP  Visit Diagnosis: Cognitive communication deficit (R41.841)    Recommendations for follow up therapy are one component of a multi-disciplinary discharge planning process, led by the attending physician.  Recommendations may be updated based on patient status, additional functional criteria and insurance authorization.    Follow Up Recommendations  No SLP follow up    Assistance Recommended at Discharge  PRN  Functional Status Assessment Patient has not had a recent decline in their functional status  Frequency and Duration Other (Comment) (Evaluation only)         SLP Evaluation Cognition  Overall Cognitive Status: Within Functional Limits for tasks assessed Arousal/Alertness: Awake/alert Orientation Level: Oriented X4 Year: 2023 Month: June Day of Week: Correct Attention: Sustained Sustained Attention: Appears intact Memory: Appears intact Awareness: Appears intact Problem Solving: Appears intact Safety/Judgment: Appears intact       Comprehension  Auditory Comprehension Overall Auditory Comprehension: Appears within functional limits for tasks assessed Yes/No Questions: Within Functional Limits Commands: Within Functional Limits Conversation: Complex Visual Recognition/Discrimination Discrimination: Within Function Limits Reading Comprehension Reading Status: Within funtional limits (for environmental signs; has L cataract; visual acuity returned to baseline; doesn't have glasses)    Expression Expression Primary Mode of Expression: Verbal Verbal Expression Overall Verbal Expression: Appears within functional limits for tasks assessed Initiation: No impairment Level of Generative/Spontaneous Verbalization: Conversation Repetition: No impairment Naming: No impairment Pragmatics: No impairment Non-Verbal Means of Communication: Not applicable Written Expression Dominant Hand: Right Written Expression: Within Functional Limits   Oral / Motor  Oral Motor/Sensory  Function Overall Oral Motor/Sensory Function: Other (comment) (slight R deviation at rest; lingual disoloration on left tip) Motor Speech Overall Motor Speech: Appears within functional limits for tasks assessed Respiration: Within functional limits Phonation: Normal Resonance: Within functional limits Articulation: Within functional limitis Intelligibility: Intelligible Motor Planning: Witnin functional limits Motor Speech Errors: Not applicable            Elvina Sidle, M.S., CCC-SLP 11/16/2021, 12:20 PM

## 2021-11-16 NOTE — Discharge Summary (Signed)
Physician Discharge Summary  [name] [name] LGX:[id number] DOB: December 10, 1950 DOA: 11/15/2021  PCP: [name], MD  Admit date: 11/15/2021 Discharge date: 11/16/2021  Time spent: 40 minutes  Recommendations for Outpatient Follow-up:  Follow outpatient CBC/CMP  Follow pending a1c Follow with neurology outpatient Follow with GI outpatient for iron def anemia -> outpatient scopes? Follow iron def anemia and folate outpatient   Discharge Diagnoses:  Principal Problem:   TIA (transient ischemic attack) Active Problems:   Iron deficiency anemia   Folate deficiency   GERD (gastroesophageal reflux disease)   History of GI diverticular bleed   Hyperlipidemia   Discharge Condition: stable  Diet recommendation: heart healthy  There were no vitals filed for this visit.  History of present illness:  [name] [name] is [name] [name] 71 y.o. female past medical history of recent acute lower GI bleed, prior history of breast cancer and mastectomy hyperlipidemia, presented to the emergency room for evaluation of an episode of word finding difficulty, nausea, lightheadedness and blurred vision.  She had an MRI that showed chronic strokes as well as [name] [name] possible 7 mm acute infarct vs artifact (neurology feels this is most c/w artifact).  She was admitted for TIA workup.  Neuro consulted.  She's been started on aspirin and lipitor increased to 40 mg daily.    Stable at time of discharge with improved neuro symptoms.   See below for additional details  Hospital Course:  Assessment and Plan: * TIA (transient ischemic attack) Appreciate neurology assistance Findings concerning for L sided TIA with transient aphasia MRI brain with subtle 7 mm acute infarct vs artifact within the posterior right frontal lobe subcortical white matter.  Background mild cerebral white matter chronic small vessel ischemic disease. Chronic lacunar infarcts within posterior right lentiform nucleus and medial left thalamus. subcentimeter  chronic infarcts within bilateral cerebellar hemispheres. CTA head/neck without LVO or emergent findings, diffuse tortuosity of major arterial vasculature of the head and neck suggesting chronic underlying hypertension PT/OT/SLP not recommending any additional follow up Aspirin, lipitor  LDL 80, a1c pending Echo with EF 81-44%, grade 1 diastolic dysfunction   Folate deficiency Folate daily, follow outpatient  Iron deficiency anemia IV iron prior to discharge Discharge with iron three times weekly  Needs GI follow up with need for outpatient follow up  GERD (gastroesophageal reflux disease) Daily PPI  History of GI diverticular bleed Appreciate GI assistance, ok for aspirin, recommending continue PPI daily for hx large hiatal hernia Has follow up with GI outpatient     Procedures: Echo IMPRESSIONS     1. Left ventricular ejection fraction, by estimation, is 60 to 65%. The  left ventricle has normal function. The left ventricle has no regional  wall motion abnormalities. Left ventricular diastolic parameters are  consistent with Grade I diastolic  dysfunction (impaired relaxation).   2. Right ventricular systolic function is normal. The right ventricular  size is normal.   3. The mitral valve is normal in structure. No evidence of mitral valve  regurgitation. No evidence of mitral stenosis.   4. The aortic valve is normal in structure. Aortic valve regurgitation is  not visualized. No aortic stenosis is present.   5. The inferior vena cava is normal in size with greater than 50%  respiratory variability, suggesting right atrial pressure of 3 mmHg.   Conclusion(s)/Recommendation(s): No intracardiac source of embolism  detected on this transthoracic study. Consider [name] [name] transesophageal  echocardiogram to exclude cardiac source of embolism if clinically  indicated.    Consultations:  neurology  Discharge Exam: Vitals:   11/16/21 0817 11/16/21 1209  BP: 126/68 (!)  135/119  Pulse: 78 76  Resp:  18  Temp: 98.5 F (36.9 C) 98.3 F (36.8 C)  SpO2: 98% 98%   No complaints Boss at bedside for [name] [name] period of time   General: No acute distress. Cardiovascular: RRR Lungs: unlabored Abdomen: Soft, nontender, nondistended  Neurological: Alert and oriented 3. Moves all extremities 4 with equal strength. Cranial nerves II through XII intact. Skin: Warm and dry. No rashes or lesions. Extremities: No clubbing or cyanosis. No edema.   Discharge Instructions   Discharge Instructions     Ambulatory referral to Neurology   Complete by: As directed    An appointment is requested in approximately: 4 weeks   Call MD for:  difficulty breathing, headache or visual disturbances   Complete by: As directed    Call MD for:  extreme fatigue   Complete by: As directed    Call MD for:  hives   Complete by: As directed    Call MD for:  persistant dizziness or light-headedness   Complete by: As directed    Call MD for:  persistant nausea and vomiting   Complete by: As directed    Call MD for:  redness, tenderness, or signs of infection (pain, swelling, redness, odor or green/yellow discharge around incision site)   Complete by: As directed    Call MD for:  severe uncontrolled pain   Complete by: As directed    Call MD for:  temperature >100.4   Complete by: As directed    Diet - low sodium heart healthy   Complete by: As directed    Discharge instructions   Complete by: As directed    You were seen for [name] [name] transient ischemic attack.    You were seen by neurology.  Your MRI has evidence of chronic strokes.  There's [name] [name] subtle right sided finding that we think is likely artifact.  Neurology suspects this was [name] [name] left sided TIA (transient ischemic attack).  We'll start you on aspirin daily (watch for signs of bleeding).  Continue your nexium.  Increase your lipitor to 40 mg daily.    We'll start you on folate for folate deficiency.  We also gave you IV iron prior to  discharge.  You should start three times weekly iron.   Your A1c is pending at discharge (follow up with your PCP for this final result).  Return for new, recurrent, or worsening symptoms.  Please ask your PCP to request records from this hospitalization so they know what was done and what the next steps will be.   Increase activity slowly   Complete by: As directed       Allergies as of 11/16/2021   No Known Allergies      Medication List     TAKE these medications    aspirin EC 81 MG tablet Take 1 tablet (81 mg total) by mouth daily. Swallow whole. Start taking on: November 17, 2021   atorvastatin 40 MG tablet Commonly known as: LIPITOR Take 1 tablet (40 mg total) by mouth at bedtime. What changed:  medication strength how much to take   Claritin 10 MG tablet Generic drug: loratadine Take 10 mg by mouth daily as needed (for seasonal allergies).   ferrous sulfate 325 (65 FE) MG tablet Take 1 tablet (325 mg total) by mouth 3 (three) times Kenzie Thoreson week.   fluticasone 50 MCG/ACT nasal spray Commonly known as:  FLONASE Place 1-2 sprays into both nostrils daily as needed (for seasonal allergies).   folic acid 1 MG tablet Commonly known as: FOLVITE Take 1 tablet (1 mg total) by mouth daily. Follow up your folate levels and anemia with your PCP. Start taking on: November 17, 2021   NexIUM 24HR 20 MG capsule Generic drug: esomeprazole Take 20 mg by mouth daily before breakfast.   OVER THE COUNTER MEDICATION Take 1 capsule by mouth See admin instructions. [place] with Astaxanthin capsules- Take 1 capsule by mouth every morning   OVER THE COUNTER MEDICATION Take 1 capsule by mouth See admin instructions. Kelli Hope Essential-1 with Vitamin D3-2000 capsules- Take 1 capsule by mouth once Zooey Schreurs day after breakfast   polyethylene glycol 17 g packet Commonly known as: MIRALAX / GLYCOLAX Take 17 g by mouth daily. What changed: when to take this    Vitamin D (Ergocalciferol) 1.25 MG (50000 UNIT) Caps capsule Commonly known as: DRISDOL Take 50,000 Units by mouth See admin instructions. Take 50,000 units by mouth every Wednesday and Saturday       No Known Allergies    The results of significant diagnostics from this hospitalization (including imaging, microbiology, ancillary and laboratory) are listed below for reference.    Significant Diagnostic Studies: ECHOCARDIOGRAM COMPLETE  Result Date: 11/16/2021    ECHOCARDIOGRAM REPORT   Patient Name:   [name] [name] Date of Exam: 11/16/2021 Medical Rec #:  [id number]      Height:       65.0 in Accession #:    [id number]     Weight:       189.0 lb Date of Birth:  19-Aug-1950      BSA:          1.931 m Patient Age:    49 years       BP:           126/68 mmHg Patient Gender: F              HR:           78 bpm. Exam Location:  Inpatient Procedure: 2D Echo, Cardiac Doppler and Color Doppler Indications:    Stroke  History:        Patient has no prior history of Echocardiogram examinations.  Sonographer:    Jefferey Pica Referring Phys: [place]  1. Left ventricular ejection fraction, by estimation, is 60 to 65%. The left ventricle has normal function. The left ventricle has no regional wall motion abnormalities. Left ventricular diastolic parameters are consistent with Grade I diastolic dysfunction (impaired relaxation).  2. Right ventricular systolic function is normal. The right ventricular size is normal.  3. The mitral valve is normal in structure. No evidence of mitral valve regurgitation. No evidence of mitral stenosis.  4. The aortic valve is normal in structure. Aortic valve regurgitation is not visualized. No aortic stenosis is present.  5. The inferior vena cava is normal in size with greater than 50% respiratory variability, suggesting right atrial pressure of 3 mmHg. Conclusion(s)/Recommendation(s): No intracardiac source of embolism detected on this transthoracic  study. Consider Nnenna Meador transesophageal echocardiogram to exclude cardiac source of embolism if clinically indicated. FINDINGS  Left Ventricle: Left ventricular ejection fraction, by estimation, is 60 to 65%. The left ventricle has normal function. The left ventricle has no regional wall motion abnormalities. The left ventricular internal cavity size was normal in size. There is  no left ventricular hypertrophy. Left ventricular diastolic parameters  are consistent with Grade I diastolic dysfunction (impaired relaxation). Right Ventricle: The right ventricular size is normal. No increase in right ventricular wall thickness. Right ventricular systolic function is normal. Left Atrium: Left atrial size was normal in size. Right Atrium: Right atrial size was normal in size. Pericardium: There is no evidence of pericardial effusion. Mitral Valve: The mitral valve is normal in structure. No evidence of mitral valve regurgitation. No evidence of mitral valve stenosis. Tricuspid Valve: The tricuspid valve is normal in structure. Tricuspid valve regurgitation is mild . No evidence of tricuspid stenosis. Aortic Valve: The aortic valve is normal in structure. Aortic valve regurgitation is not visualized. No aortic stenosis is present. Aortic valve peak gradient measures 8.6 mmHg. Pulmonic Valve: The pulmonic valve was normal in structure. Pulmonic valve regurgitation is not visualized. No evidence of pulmonic stenosis. Aorta: The aortic root is normal in size and structure. Venous: The inferior vena cava is normal in size with greater than 50% respiratory variability, suggesting right atrial pressure of 3 mmHg. IAS/Shunts: No atrial level shunt detected by color flow Doppler.  LEFT VENTRICLE PLAX 2D LVIDd:         5.20 cm   Diastology LVIDs:         2.90 cm   LV e' medial:    8.70 cm/s LV PW:         1.00 cm   LV E/e' medial:  8.9 LV IVS:        0.90 cm   LV e' lateral:   8.70 cm/s LVOT diam:     1.90 cm   LV E/e' lateral: 8.9 LV SV:          66 LV SV Index:   34 LVOT Area:     2.84 cm  RIGHT VENTRICLE             IVC RV Basal diam:  3.00 cm     IVC diam: 1.70 cm RV S prime:     18.60 cm/s TAPSE (M-mode): 2.6 cm LEFT ATRIUM             Index        RIGHT ATRIUM           Index LA diam:        3.50 cm 1.81 cm/m   RA Area:     11.70 cm LA Vol (A2C):   38.6 ml 19.99 ml/m  RA Volume:   25.40 ml  13.15 ml/m LA Vol (A4C):   51.8 ml 26.82 ml/m LA Biplane Vol: 46.8 ml 24.23 ml/m  AORTIC VALVE                 PULMONIC VALVE AV Area (Vmax): 2.48 cm     PV Vmax:       0.91 m/s AV Vmax:        146.50 cm/s  PV Peak grad:  3.3 mmHg AV Peak Grad:   8.6 mmHg LVOT Vmax:      128.00 cm/s LVOT Vmean:     75.300 cm/s LVOT VTI:       0.233 m  AORTA Ao Root diam: 3.40 cm Ao Asc diam:  3.70 cm MITRAL VALVE MV Area (PHT): 4.10 cm     SHUNTS MV Decel Time: 185 msec     Systemic VTI:  0.23 m MV E velocity: 77.10 cm/s   Systemic Diam: 1.90 cm MV Aarin Bluett velocity: 114.00 cm/s MV E/Brantleigh Mifflin ratio:  0.68 Candee Furbish MD Electronically signed by Elta Guadeloupe  Skains MD Signature Date/Time: 11/16/2021/11:20:30 AM    Final    CT ANGIO HEAD NECK W WO CM  Result Date: 11/16/2021 CLINICAL DATA:  Follow-up examination for acute stroke. EXAM: CT ANGIOGRAPHY HEAD AND NECK TECHNIQUE: Multidetector CT imaging of the head and neck was performed using the standard protocol during bolus administration of intravenous contrast. Multiplanar CT image reconstructions and MIPs were obtained to evaluate the vascular anatomy. Carotid stenosis measurements (when applicable) are obtained utilizing NASCET criteria, using the distal internal carotid diameter as the denominator. RADIATION DOSE REDUCTION: This exam was performed according to the departmental dose-optimization program which includes automated exposure control, adjustment of the mA and/or kV according to patient size and/or use of iterative reconstruction technique. CONTRAST:  65m OMNIPAQUE IOHEXOL 350 MG/ML SOLN COMPARISON:  Prior MRI and CT from  earlier the same day. FINDINGS: CTA NECK FINDINGS Aortic arch: Visualized aortic arch normal in caliber with standard 3 vessel morphology. No stenosis or other abnormality about the origin of the great vessels. Right carotid system: Right common and internal carotid arteries are tortuous but widely patent without stenosis or dissection. Left carotid system: Left common and internal carotid arteries are tortuous but widely patent without stenosis or dissection. Vertebral arteries: Both vertebral arteries arise from the subclavian arteries. No proximal subclavian artery stenosis. Both vertebral arteries widely patent without stenosis, dissection or occlusion. Skeleton: No discrete or worrisome osseous lesions. Mild for age cervical spondylosis, most pronounced at C5-6. Other neck: No other acute soft tissue abnormality within the neck. Chronic paranasal sinus disease noted. Upper chest: Visualized upper chest demonstrates no acute finding. Review of the MIP images confirms the above findings CTA HEAD FINDINGS Anterior circulation: Evaluation intracranial circulation somewhat limited by motion. Both internal carotid arteries patent to the termini without stenosis or other abnormality. A1 segments, anterior communicating artery complex common anterior cerebral arteries patent without stenosis or other visible abnormality. No significant M1 stenosis. Normal MCA bifurcations. No proximal MCA branch occlusion. Distal MCA branches perfused and symmetric. Posterior circulation: Both vertebral arteries patent without significant stenosis. Left vertebral artery slightly dominant. Both PICA patent. Basilar patent to its distal aspect without appreciable stenosis. Superior cerebellar arteries patent bilaterally. Left PCA supplied via the basilar as well as Dona Walby small left posterior communicating artery. Predominant fetal type origin of the right PCA. Both PCAs patent to their distal aspects without stenosis. Venous sinuses: Patent  allowing for timing the contrast bolus. Anatomic variants: Fetal type origin right PCA. No intracranial aneurysm. Review of the MIP images confirms the above findings IMPRESSION: 1. Negative CTA of the head and neck. No large vessel occlusion or other emergent finding. No significant atheromatous disease for age. No hemodynamically significant or correctable stenosis. 2. Diffuse tortuosity of the major arterial vasculature of the head and neck, suggesting chronic underlying hypertension. Electronically Signed   By: BJeannine BogaM.D.   On: 11/16/2021 02:12   DG CHEST PORT 1 VIEW  Result Date: 11/15/2021 CLINICAL DATA:  Episode of slurred speech this morning. EXAM: PORTABLE CHEST 1 VIEW COMPARISON:  Remote radiograph 10/29/2003 FINDINGS: Upper normal heart size likely accentuated by portable technique. Mild aortic atherosclerosis. Retrocardiac hiatal hernia. The lungs are clear. Pulmonary vasculature is normal. No consolidation, pleural effusion, or pneumothorax. No acute osseous abnormalities are seen. Surgical clips project over the right chest wall/breast. IMPRESSION: No acute chest findings. Retrocardiac hiatal hernia. Electronically Signed   By: MKeith RakeM.D.   On: 11/15/2021 19:36   MR BRAIN WO CONTRAST  Result Date: 11/15/2021 CLINICAL DATA:  Provided history: Neuro deficit, acute, stroke suspected. Additional history provided: Difficulty speaking/garbled speech and blurred vision earlier today, symptoms now resolved. EXAM: MRI HEAD WITHOUT CONTRAST TECHNIQUE: Multiplanar, multiecho pulse sequences of the brain and surrounding structures were obtained without intravenous contrast. COMPARISON:  Prior head CT 11/15/2021. FINDINGS: Mild intermittent motion degradation. Brain: No age advanced or lobar predominant parenchymal atrophy. Blaike Vickers subtle 7 mm focus of restricted diffusion is questioned within the posterior right frontal lobe subcortical white matter (series 5, image 80). This may  reflect an acute infarct or artifact. Multifocal T2 FLAIR hyperintense signal abnormality within the cerebral white matter, nonspecific but compatible with mild chronic small vessel ischemic disease. Chronic lacunar infarcts within the posterior right lentiform nucleus and medial left thalamus. Subcentimeter chronic infarcts within the bilateral cerebellar hemispheres. No evidence of an intracranial mass. No chronic intracranial blood products. No extra-axial fluid collection. No midline shift. Vascular: Maintained flow voids within the proximal large arterial vessels. Skull and upper cervical spine: No focal suspicious marrow lesion. Incompletely assessed cervical spondylosis. Sinuses/Orbits: No mass or acute finding within the imaged orbits. Postsurgical appearance of the paranasal sinuses. Complete opacification of the left frontal sinus. Mild mucosal thickening within the right frontal sinus. Mild-to-moderate mucosal thickening within the bilateral ethmoid sinuses/ethmoidectomy cavities. Mild-to-moderate mucosal thickening within the bilateral sphenoid sinuses. Mild mucosal thickening within the bilateral maxillary sinuses. IMPRESSION: 1. Subtle 7 mm acute infarct versus artifact within the posterior right frontal lobe subcortical white matter. 2. Background mild cerebral white matter chronic small vessel ischemic disease. 3. Chronic lacunar infarcts within the posterior right lentiform nucleus and medial left thalamus. 4. Subcentimeter chronic infarcts within the bilateral cerebellar hemispheres. 5. Paranasal sinus disease, as described. Electronically Signed   By: Kellie Simmering D.O.   On: 11/15/2021 18:28   CT Head Wo Contrast  Result Date: 11/15/2021 CLINICAL DATA:  Neuro deficit, acute, stroke suspected EXAM: CT HEAD WITHOUT CONTRAST TECHNIQUE: Contiguous axial images were obtained from the base of the skull through the vertex without intravenous contrast. RADIATION DOSE REDUCTION: This exam was performed  according to the departmental dose-optimization program which includes automated exposure control, adjustment of the mA and/or kV according to patient size and/or use of iterative reconstruction technique. COMPARISON:  None Available. FINDINGS: Brain: No evidence of acute large vascular territory infarction, hemorrhage, hydrocephalus, extra-axial collection or mass lesion/mass effect. Remote appearing small lacunar infarct in the right cerebellum. Vascular: No definite hyperdense vessel identified. Calcific intracranial atherosclerosis. Skull: No acute fracture. Sinuses/Orbits: Moderate paranasal sinus mucosal thickening. Other: No mastoid effusions. IMPRESSION: 1. No evidence of acute intracranial abnormality. 2. Small remote appearing right cerebellar lacunar infarct. 3. Moderate paranasal sinus mucosal thickening. Electronically Signed   By: Margaretha Sheffield M.D.   On: 11/15/2021 13:55   DG Abd 1 View  Result Date: 10/28/2021 CLINICAL DATA:  Nausea vomiting. EXAM: ABDOMEN - 1 VIEW COMPARISON:  None Available. FINDINGS: Air is seen scattered throughout nondistended small bowel and nondilated colon. Bones are diffusely demineralized. IMPRESSION: Nonobstructive bowel gas pattern. Electronically Signed   By: Misty Stanley M.D.   On: 10/28/2021 10:53   CT ANGIO GI BLEED  Result Date: 10/25/2021 CLINICAL DATA:  Bright red blood per rectum. EXAM: CTA ABDOMEN AND PELVIS WITHOUT AND WITH CONTRAST TECHNIQUE: Multidetector CT imaging of the abdomen and pelvis was performed using the standard protocol during bolus administration of intravenous contrast. Multiplanar reconstructed images and MIPs were obtained and reviewed to evaluate the vascular anatomy. RADIATION  DOSE REDUCTION: This exam was performed according to the departmental dose-optimization program which includes automated exposure control, adjustment of the mA and/or kV according to patient size and/or use of iterative reconstruction technique. CONTRAST:   129m OMNIPAQUE IOHEXOL 350 MG/ML SOLN COMPARISON:  None Available. FINDINGS: VASCULAR Aorta: Normal caliber aorta without aneurysm, dissection, vasculitis or significant stenosis. Celiac: Patent without evidence of aneurysm, dissection, vasculitis or significant stenosis. SMA: Patent without evidence of aneurysm, dissection, vasculitis or significant stenosis. Renals: Both renal arteries are patent without evidence of aneurysm, dissection, vasculitis, fibromuscular dysplasia or significant stenosis. IMA: Patent without evidence of aneurysm, dissection, vasculitis or significant stenosis. Inflow: Patent without evidence of aneurysm, dissection, vasculitis or significant stenosis. Proximal Outflow: Bilateral common femoral and visualized portions of the superficial and profunda femoral arteries are patent without evidence of aneurysm, dissection, vasculitis or significant stenosis. Veins: No obvious venous abnormality within the limitations of this arterial phase study. Review of the MIP images confirms the above findings. NON-VASCULAR Lower chest: Large hiatal hernia. Hepatobiliary: No focal liver abnormality is seen. No gallstones, gallbladder wall thickening, or biliary dilatation. Pancreas: Unremarkable. No pancreatic ductal dilatation or surrounding inflammatory changes. Spleen: Normal in size without focal abnormality. Adrenals/Urinary Tract: Adrenal glands are unremarkable. Kidneys are without renal calculi, or hydronephrosis. 1 cm exophytic mass off of the upper pole of the left kidney, too small to be actually characterized. Bladder is unremarkable. Stomach/Bowel: Stomach is within normal limits. No evidence of appendicitis. No evidence of bowel wall thickening, distention, or inflammatory changes. Diffuse colonic diverticulosis without evidence of acute diverticulitis. Lymphatic: Aortic atherosclerosis. No enlarged abdominal or pelvic lymph nodes. Reproductive: Status post hysterectomy. No adnexal masses.  Other: No abdominal wall hernia or abnormality. No abdominopelvic ascites. Musculoskeletal:  L4-L5 spondylosis. IMPRESSION: 1. No evidence of acute abnormalities within the abdomen or pelvis. 2. Diffuse colonic diverticulosis without evidence of acute diverticulitis. 3. Large hiatal hernia. Aortic Atherosclerosis (ICD10-I70.0). Electronically Signed   By: DFidela SalisburyM.D.   On: 10/25/2021 12:01    Microbiology: Recent Results (from the past 240 hour(s))  SARS Coronavirus 2 by RT PCR ([facility] *cepheid single result test* Anterior Nasal Swab     Status: None   Collection Time: 11/15/21  7:56 PM   Specimen: Anterior Nasal Swab  Result Value Ref Range Status   SARS Coronavirus 2 by RT PCR NEGATIVE NEGATIVE Final    Comment: (NOTE) SARS-CoV-2 target nucleic acids are NOT DETECTED.  The SARS-CoV-2 RNA is generally detectable in upper and lower respiratory specimens during the acute phase of infection. The lowest concentration of SARS-CoV-2 viral copies this assay can detect is 250 copies / mL. Eron Staat negative result does not preclude SARS-CoV-2 infection and should not be used as the sole basis for treatment or other patient management decisions.  Abrea Henle negative result may occur with improper specimen collection / handling, submission of specimen other than nasopharyngeal swab, presence of viral mutation(s) within the areas targeted by this assay, and inadequate number of viral copies (<250 copies / mL). Lakiesha Ralphs negative result must be combined with clinical observations, patient history, and epidemiological information.  Fact Sheet for Patients:   hhttps://www.patel.info/ Fact Sheet for Healthcare Providers: hhttps://hall.com/ This test is not yet approved or  cleared by the [place] and has been authorized for detection and/or diagnosis of SARS-CoV-2 by FDA under an Emergency Use Authorization  (EUA).  This EUA will remain in effect (meaning this test can be used)  for the duration of the COVID-19 declaration under Section 564(b)(1) of the Act, 21 U.S.C. section 360bbb-3(b)(1), unless the authorization is terminated or revoked sooner.  Performed at [facility], [place] [address] [place], [place] [address]      Labs: Basic Metabolic Panel: Recent Labs  Lab 11/15/21 1130 11/15/21 2017 11/16/21 0254  NA 137  --  139  K 3.7  --  3.6  CL 107  --  107  CO2 22  --  23  GLUCOSE 119*  --  105*  BUN 10  --  9  CREATININE 0.82  --  0.64  CALCIUM 8.9  --  8.9  MG  --  2.2  --   PHOS  --  4.0  --    Liver Function Tests: Recent Labs  Lab 11/15/21 1130 11/16/21 0254  AST 18 15  ALT 17 15  ALKPHOS 86 78  BILITOT 0.4 0.4  PROT 6.6 6.2*  ALBUMIN 3.8 3.6   No results for input(s): "LIPASE", "AMYLASE" in the last 168 hours. No results for input(s): "AMMONIA" in the last 168 hours. CBC: Recent Labs  Lab 11/15/21 1130 11/15/21 2017 11/16/21 0252 11/16/21 1407  WBC 7.1 8.5 7.0 6.0  HGB 8.8* 9.2* 8.7* 9.1*  HCT 28.8* 29.5* 27.5* 28.3*  MCV 87.8 87.8 86.2 86.8  PLT 479* 491* 448* 457*   Cardiac Enzymes: Recent Labs  Lab 11/15/21 2017  CKTOTAL 42   BNP: BNP (last 3 results) No results for input(s): "BNP" in the last 8760 hours.  ProBNP (last 3 results) No results for input(s): "PROBNP" in the last 8760 hours.  CBG: No results for input(s): "GLUCAP" in the last 168 hours.     Signed:  Fayrene [place] MD.  [facility] 11/16/2021, 3:34 PM

## 2021-11-16 NOTE — Assessment & Plan Note (Signed)
Appreciate neurology assistance Findings concerning for L sided TIA with transient aphasia MRI brain with subtle 7 mm acute infarct vs artifact within the posterior right frontal lobe subcortical white matter.  Background mild cerebral white matter chronic small vessel ischemic disease. Chronic lacunar infarcts within posterior right lentiform nucleus and medial left thalamus. subcentimeter chronic infarcts within bilateral cerebellar hemispheres. CTA head/neck without LVO or emergent findings, diffuse tortuosity of major arterial vasculature of the head and neck suggesting chronic underlying hypertension PT/OT/SLP not recommending any additional follow up Aspirin, lipitor  LDL 80, a1c pending Echo with EF 55-37%, grade 1 diastolic dysfunction

## 2021-11-16 NOTE — TOC Transition Note (Signed)
Transition of Care [facility]) - CM/SW Discharge Note   Patient Details  Name: [name] [name] MRN: [id number] Date of Birth: 06/22/1950  Transition of Care [facility]) CM/SW Contact:  Pollie Friar, RN Phone Number: 11/16/2021, 3:28 PM   Clinical Narrative:    Pt is discharging home with self care. No needs per TOC.   Final next level of care: Home/Self Care Barriers to Discharge: No Barriers Identified   Patient Goals and CMS Choice        Discharge Placement                       Discharge Plan and Services                                     Social Determinants of Health (SDOH) Interventions     Readmission Risk Interventions     No data to display

## 2021-11-16 NOTE — Progress Notes (Addendum)
STROKE TEAM PROGRESS NOTE   INTERVAL HISTORY Patient is seen in her room with no family at the bedside.  She reports that yesterday morning, she had transient difficulties moving her eyes to focus her vision followed by transient aphasia.  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  Her boss called 911, but her aphasia resolved before she got to the hospital.  Patient states that her speech is normal now.  She has had a recent admission for a GI bleed, but her gastroenterologist is OK with starting aspirin.  Vitals:   11/15/21 2115 11/16/21 0032 11/16/21 0817 11/16/21 1209  BP: (!) 151/72 (!) 164/76 126/68 (!) 135/119  Pulse: 83 83 78 76  Resp: 16   18  Temp: 98.2 F (36.8 C) 98 F (36.7 C) 98.5 F (36.9 C) 98.3 F (36.8 C)  TempSrc: Oral Oral Oral Oral  SpO2: 95% 98% 98% 98%   CBC:  Recent Labs  Lab 11/15/21 2017 11/16/21 0252  WBC 8.5 7.0  HGB 9.2* 8.7*  HCT 29.5* 27.5*  MCV 87.8 86.2  PLT 491* 219*   Basic Metabolic Panel:  Recent Labs  Lab 11/15/21 1130 11/15/21 2017 11/16/21 0254  NA 137  --  139  K 3.7  --  3.6  CL 107  --  107  CO2 22  --  23  GLUCOSE 119*  --  105*  BUN 10  --  9  CREATININE 0.82  --  0.64  CALCIUM 8.9  --  8.9  MG  --  2.2  --   PHOS  --  4.0  --    Lipid Panel:  Recent Labs  Lab 11/15/21 1130  CHOL 158  TRIG 143  HDL 49  CHOLHDL 3.2  VLDL 29  LDLCALC 80   HgbA1c: No results for input(s): "HGBA1C" in the last 168 hours. Urine Drug Screen: No results for input(s): "LABOPIA", "COCAINSCRNUR", "LABBENZ", "AMPHETMU", "THCU", "LABBARB" in the last 168 hours.  Alcohol Level No results for input(s): "ETH" in the last 168 hours.  IMAGING past 24 hours ECHOCARDIOGRAM COMPLETE  Result Date: 11/16/2021    ECHOCARDIOGRAM REPORT   Patient Name:   WHITTNEY STEENSON Date of Exam: 11/16/2021 Medical Rec #:  [id number]      Height:       65.0 in Accession #:    [id number]     Weight:       189.0 lb Date  of Birth:  09-Jan-1951      BSA:          1.931 m Patient Age:    71 years       BP:           126/68 mmHg Patient Gender: F              HR:           78 bpm. Exam Location:  Inpatient Procedure: 2D Echo, Cardiac Doppler and Color Doppler Indications:    Stroke  History:        Patient has no prior history of Echocardiogram examinations.  Sonographer:    Jefferey Pica Referring Phys: [place]  1. Left ventricular ejection fraction, by estimation, is 60 to 65%. The left ventricle has normal function. The left ventricle has no regional wall motion abnormalities. Left ventricular diastolic parameters are consistent with Grade I diastolic dysfunction (impaired relaxation).  2. Right ventricular systolic function is normal.  The right ventricular size is normal.  3. The mitral valve is normal in structure. No evidence of mitral valve regurgitation. No evidence of mitral stenosis.  4. The aortic valve is normal in structure. Aortic valve regurgitation is not visualized. No aortic stenosis is present.  5. The inferior vena cava is normal in size with greater than 50% respiratory variability, suggesting right atrial pressure of 3 mmHg. Conclusion(s)/Recommendation(s): No intracardiac source of embolism detected on this transthoracic study. Consider a transesophageal echocardiogram to exclude cardiac source of embolism if clinically indicated. FINDINGS  Left Ventricle: Left ventricular ejection fraction, by estimation, is 60 to 65%. The left ventricle has normal function. The left ventricle has no regional wall motion abnormalities. The left ventricular internal cavity size was normal in size. There is  no left ventricular hypertrophy. Left ventricular diastolic parameters are consistent with Grade I diastolic dysfunction (impaired relaxation). Right Ventricle: The right ventricular size is normal. No increase in right ventricular wall thickness. Right ventricular systolic function is normal.  Left Atrium: Left atrial size was normal in size. Right Atrium: Right atrial size was normal in size. Pericardium: There is no evidence of pericardial effusion. Mitral Valve: The mitral valve is normal in structure. No evidence of mitral valve regurgitation. No evidence of mitral valve stenosis. Tricuspid Valve: The tricuspid valve is normal in structure. Tricuspid valve regurgitation is mild . No evidence of tricuspid stenosis. Aortic Valve: The aortic valve is normal in structure. Aortic valve regurgitation is not visualized. No aortic stenosis is present. Aortic valve peak gradient measures 8.6 mmHg. Pulmonic Valve: The pulmonic valve was normal in structure. Pulmonic valve regurgitation is not visualized. No evidence of pulmonic stenosis. Aorta: The aortic root is normal in size and structure. Venous: The inferior vena cava is normal in size with greater than 50% respiratory variability, suggesting right atrial pressure of 3 mmHg. IAS/Shunts: No atrial level shunt detected by color flow Doppler.  LEFT VENTRICLE PLAX 2D LVIDd:         5.20 cm   Diastology LVIDs:         2.90 cm   LV e' medial:    8.70 cm/s LV PW:         1.00 cm   LV E/e' medial:  8.9 LV IVS:        0.90 cm   LV e' lateral:   8.70 cm/s LVOT diam:     1.90 cm   LV E/e' lateral: 8.9 LV SV:         66 LV SV Index:   34 LVOT Area:     2.84 cm  RIGHT VENTRICLE             IVC RV Basal diam:  3.00 cm     IVC diam: 1.70 cm RV S prime:     18.60 cm/s TAPSE (M-mode): 2.6 cm LEFT ATRIUM             Index        RIGHT ATRIUM           Index LA diam:        3.50 cm 1.81 cm/m   RA Area:     11.70 cm LA Vol (A2C):   38.6 ml 19.99 ml/m  RA Volume:   25.40 ml  13.15 ml/m LA Vol (A4C):   51.8 ml 26.82 ml/m LA Biplane Vol: 46.8 ml 24.23 ml/m  AORTIC VALVE  PULMONIC VALVE AV Area (Vmax): 2.48 cm     PV Vmax:       0.91 m/s AV Vmax:        146.50 cm/s  PV Peak grad:  3.3 mmHg AV Peak Grad:   8.6 mmHg LVOT Vmax:      128.00 cm/s LVOT Vmean:      75.300 cm/s LVOT VTI:       0.233 m  AORTA Ao Root diam: 3.40 cm Ao Asc diam:  3.70 cm MITRAL VALVE MV Area (PHT): 4.10 cm     SHUNTS MV Decel Time: 185 msec     Systemic VTI:  0.23 m MV E velocity: 77.10 cm/s   Systemic Diam: 1.90 cm MV A velocity: 114.00 cm/s MV E/A ratio:  0.68 Candee Furbish MD Electronically signed by Candee Furbish MD Signature Date/Time: 11/16/2021/11:20:30 AM    Final    CT ANGIO HEAD NECK W WO CM  Result Date: 11/16/2021 CLINICAL DATA:  Follow-up examination for acute stroke. EXAM: CT ANGIOGRAPHY HEAD AND NECK TECHNIQUE: Multidetector CT imaging of the head and neck was performed using the standard protocol during bolus administration of intravenous contrast. Multiplanar CT image reconstructions and MIPs were obtained to evaluate the vascular anatomy. Carotid stenosis measurements (when applicable) are obtained utilizing NASCET criteria, using the distal internal carotid diameter as the denominator. RADIATION DOSE REDUCTION: This exam was performed according to the departmental dose-optimization program which includes automated exposure control, adjustment of the mA and/or kV according to patient size and/or use of iterative reconstruction technique. CONTRAST:  70m OMNIPAQUE IOHEXOL 350 MG/ML SOLN COMPARISON:  Prior MRI and CT from earlier the same day. FINDINGS: CTA NECK FINDINGS Aortic arch: Visualized aortic arch normal in caliber with standard 3 vessel morphology. No stenosis or other abnormality about the origin of the great vessels. Right carotid system: Right common and internal carotid arteries are tortuous but widely patent without stenosis or dissection. Left carotid system: Left common and internal carotid arteries are tortuous but widely patent without stenosis or dissection. Vertebral arteries: Both vertebral arteries arise from the subclavian arteries. No proximal subclavian artery stenosis. Both vertebral arteries widely patent without stenosis, dissection or occlusion.  Skeleton: No discrete or worrisome osseous lesions. Mild for age cervical spondylosis, most pronounced at C5-6. Other neck: No other acute soft tissue abnormality within the neck. Chronic paranasal sinus disease noted. Upper chest: Visualized upper chest demonstrates no acute finding. Review of the MIP images confirms the above findings CTA HEAD FINDINGS Anterior circulation: Evaluation intracranial circulation somewhat limited by motion. Both internal carotid arteries patent to the termini without stenosis or other abnormality. A1 segments, anterior communicating artery complex common anterior cerebral arteries patent without stenosis or other visible abnormality. No significant M1 stenosis. Normal MCA bifurcations. No proximal MCA branch occlusion. Distal MCA branches perfused and symmetric. Posterior circulation: Both vertebral arteries patent without significant stenosis. Left vertebral artery slightly dominant. Both PICA patent. Basilar patent to its distal aspect without appreciable stenosis. Superior cerebellar arteries patent bilaterally. Left PCA supplied via the basilar as well as a small left posterior communicating artery. Predominant fetal type origin of the right PCA. Both PCAs patent to their distal aspects without stenosis. Venous sinuses: Patent allowing for timing the contrast bolus. Anatomic variants: Fetal type origin right PCA. No intracranial aneurysm. Review of the MIP images confirms the above findings IMPRESSION: 1. Negative CTA of the head and neck. No large vessel occlusion or other emergent finding. No significant atheromatous disease for age.  No hemodynamically significant or correctable stenosis. 2. Diffuse tortuosity of the major arterial vasculature of the head and neck, suggesting chronic underlying hypertension. Electronically Signed   By: Jeannine Boga M.D.   On: 11/16/2021 02:12   DG CHEST PORT 1 VIEW  Result Date: 11/15/2021 CLINICAL DATA:  Episode of slurred speech  this morning. EXAM: PORTABLE CHEST 1 VIEW COMPARISON:  Remote radiograph 10/29/2003 FINDINGS: Upper normal heart size likely accentuated by portable technique. Mild aortic atherosclerosis. Retrocardiac hiatal hernia. The lungs are clear. Pulmonary vasculature is normal. No consolidation, pleural effusion, or pneumothorax. No acute osseous abnormalities are seen. Surgical clips project over the right chest wall/breast. IMPRESSION: No acute chest findings. Retrocardiac hiatal hernia. Electronically Signed   By: Keith Rake M.D.   On: 11/15/2021 19:36   MR BRAIN WO CONTRAST  Result Date: 11/15/2021 CLINICAL DATA:  Provided history: Neuro deficit, acute, stroke suspected. Additional history provided: Difficulty speaking/garbled speech and blurred vision earlier today, symptoms now resolved. EXAM: MRI HEAD WITHOUT CONTRAST TECHNIQUE: Multiplanar, multiecho pulse sequences of the brain and surrounding structures were obtained without intravenous contrast. COMPARISON:  Prior head CT 11/15/2021. FINDINGS: Mild intermittent motion degradation. Brain: No age advanced or lobar predominant parenchymal atrophy. A subtle 7 mm focus of restricted diffusion is questioned within the posterior right frontal lobe subcortical white matter (series 5, image 80). This may reflect an acute infarct or artifact. Multifocal T2 FLAIR hyperintense signal abnormality within the cerebral white matter, nonspecific but compatible with mild chronic small vessel ischemic disease. Chronic lacunar infarcts within the posterior right lentiform nucleus and medial left thalamus. Subcentimeter chronic infarcts within the bilateral cerebellar hemispheres. No evidence of an intracranial mass. No chronic intracranial blood products. No extra-axial fluid collection. No midline shift. Vascular: Maintained flow voids within the proximal large arterial vessels. Skull and upper cervical spine: No focal suspicious marrow lesion. Incompletely assessed  cervical spondylosis. Sinuses/Orbits: No mass or acute finding within the imaged orbits. Postsurgical appearance of the paranasal sinuses. Complete opacification of the left frontal sinus. Mild mucosal thickening within the right frontal sinus. Mild-to-moderate mucosal thickening within the bilateral ethmoid sinuses/ethmoidectomy cavities. Mild-to-moderate mucosal thickening within the bilateral sphenoid sinuses. Mild mucosal thickening within the bilateral maxillary sinuses. IMPRESSION: 1. Subtle 7 mm acute infarct versus artifact within the posterior right frontal lobe subcortical white matter. 2. Background mild cerebral white matter chronic small vessel ischemic disease. 3. Chronic lacunar infarcts within the posterior right lentiform nucleus and medial left thalamus. 4. Subcentimeter chronic infarcts within the bilateral cerebellar hemispheres. 5. Paranasal sinus disease, as described. Electronically Signed   By: Kellie Simmering D.O.   On: 11/15/2021 18:28   CT Head Wo Contrast  Result Date: 11/15/2021 CLINICAL DATA:  Neuro deficit, acute, stroke suspected EXAM: CT HEAD WITHOUT CONTRAST TECHNIQUE: Contiguous axial images were obtained from the base of the skull through the vertex without intravenous contrast. RADIATION DOSE REDUCTION: This exam was performed according to the departmental dose-optimization program which includes automated exposure control, adjustment of the mA and/or kV according to patient size and/or use of iterative reconstruction technique. COMPARISON:  None Available. FINDINGS: Brain: No evidence of acute large vascular territory infarction, hemorrhage, hydrocephalus, extra-axial collection or mass lesion/mass effect. Remote appearing small lacunar infarct in the right cerebellum. Vascular: No definite hyperdense vessel identified. Calcific intracranial atherosclerosis. Skull: No acute fracture. Sinuses/Orbits: Moderate paranasal sinus mucosal thickening. Other: No mastoid effusions.  IMPRESSION: 1. No evidence of acute intracranial abnormality. 2. Small remote appearing right cerebellar lacunar infarct. 3.  Moderate paranasal sinus mucosal thickening. Electronically Signed   By: Margaretha Sheffield M.D.   On: 11/15/2021 13:55    PHYSICAL EXAM General:  Alert, well-nourished, well-developed elderly patient in no acute distress Respiratory:  Regular, unlabored respirations on room air  NEURO:  Mental Status: AA&Ox3  Speech/Language: speech is without dysarthria or aphasia.  Repetition, fluency, and comprehension intact.  Cranial Nerves:  II: PERRL. Visual fields full.  III, IV, VI: EOMI. Eyelids elevate symmetrically.  V: Sensation is intact to light touch and symmetrical to face.  VII: Smile is symmetrical.   VIII: hearing intact to voice. IX, X:  Phonation is normal.  KD:TOIZTIWP shrug 5/5. XII: tongue is midline without fasciculations. Motor: 5/5 strength to all muscle groups tested.  Tone: is normal and bulk is normal Sensation- Intact to light touch bilaterally. Coordination: FTN intact bilaterally.No drift.  Gait- deferred  ASSESSMENT/PLAN Ms. [name] [name] is a 71 y.o. female with history of recent GI bleed, BRCA with mastectomy and HLD presenting with transient difficulties moving her eyes to focus her vision followed by transient aphasia.  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  Her boss called 911, but her aphasia resolved before she got to the hospital.  Patient states that her speech is normal now.  MRI shows a spot in the right hemisphere which is likely artifact.  She has had a recent admission for a GI bleed, but her gastroenterologist is OK with starting aspirin.  TIA:  left brain TIA with transient aphasia CT head No acute abnormality.  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, posterior right  lentiform nucleus and bilateral cerebellar hemispheres 2D Echo EF 80-99%, grade 1 diastolic dysfunction, no atrial level shunt LDL 80 HgbA1c pending VTE prophylaxis - SCDs No antithrombotic prior to admission, now on aspirin 81 mg daily.  No DAPT given lower GI bleeding and anemia Therapy recommendations:  no PT/OT follow up Disposition:  likely home  Hypertension Home meds:  none Stable Long-term BP goal normotensive  Hyperlipidemia Home meds:  atorvastatin 10 mg daily LDL 80, goal < 70 increased to 40 mg daily Continue statin at discharge  LGIB Admitted around 10/25/2021 CT showed diffuse diverticulosis without diverticulitis Hemoglobin 9.2->8.7, stable GI on board, okay for aspirin Close monitoring  Other Stroke Risk Factors Advanced Age >/= 9  Obesity  Other Active Problems Breast cancer status post surgery  Hospital day # [place] , MSN, AGACNP-BC [facility] See Amion for schedule and pager information 11/16/2021 12:56 PM   ATTENDING NOTE: I reviewed above note and agree with the assessment and plan. Pt was seen and examined.   71 year old female with history of recent lower GI bleeding, breast cancer status post surgery in the past, hyperlipidemia admitted for blurry vision, difficulty moving eyes to the left, short lasting.  1 hour later patient had difficulty talking on the phone with word finding difficulty.  Symptoms resolved on ED arrival.  CT no acute abnormality, old right cerebellum lacunar infarct.  MRI showed chronic right BG, left thalamic and bilateral cerebellum lacunar infarct.  No acute infarct on my review.  CTA head and neck unremarkable.  EF 60 to 65%.  LDL 80, A1c pending.  Creatinine 0.64.  Hemoglobin 9.2->8.7.  On exam, patient neurology intact, no focal deficit, fully orientated and no aphasia.  Etiology for patient symptoms concerning for TIA given multifocal lacunar infarct on MRI and CT.  Discussed with GI, okay  for aspirin.  We will continue aspirin 81 on discharge.  Increase Lipitor 10 to 40 for HLD management.  Stroke risk factor modification.  PT/OT no recommendation.  For detailed assessment and plan, please refer to above/below as I have made changes wherever appropriate.   Neurology will sign off. Please call with questions. Pt will follow up with stroke clinic NP at [facility] in about 4 weeks. Thanks for the consult.   Rosalin Hawking, MD PhD Stroke Neurology 11/16/2021 6:55 PM   To contact Stroke Continuity provider, please refer to [email or web address] After hours, contact General Neurology

## 2021-11-16 NOTE — Assessment & Plan Note (Signed)
Folate daily, follow outpatient

## 2021-11-17 LAB — HEMOGLOBIN A1C
Hgb A1c MFr Bld: 5.4 % (ref 4.8–5.6)
Mean Plasma Glucose: 108 mg/dL

## 2021-11-23 DIAGNOSIS — K922 Gastrointestinal hemorrhage, unspecified: Secondary | ICD-10-CM | POA: Diagnosis not present

## 2021-11-23 DIAGNOSIS — E782 Mixed hyperlipidemia: Secondary | ICD-10-CM | POA: Diagnosis not present

## 2021-11-23 DIAGNOSIS — E559 Vitamin D deficiency, unspecified: Secondary | ICD-10-CM | POA: Diagnosis not present

## 2021-11-24 DIAGNOSIS — D508 Other iron deficiency anemias: Secondary | ICD-10-CM | POA: Diagnosis not present

## 2021-11-24 DIAGNOSIS — K625 Hemorrhage of anus and rectum: Secondary | ICD-10-CM | POA: Diagnosis not present

## 2021-11-29 DIAGNOSIS — Z1389 Encounter for screening for other disorder: Secondary | ICD-10-CM | POA: Diagnosis not present

## 2021-11-29 DIAGNOSIS — Z0000 Encounter for general adult medical examination without abnormal findings: Secondary | ICD-10-CM | POA: Diagnosis not present

## 2021-12-19 DIAGNOSIS — D508 Other iron deficiency anemias: Secondary | ICD-10-CM | POA: Diagnosis not present

## 2021-12-19 DIAGNOSIS — K625 Hemorrhage of anus and rectum: Secondary | ICD-10-CM | POA: Diagnosis not present

## 2021-12-21 NOTE — Progress Notes (Unsigned)
[facility] [address] [place]. [place] [address] [phone]       [place] NIGEL WESSMAN Date of Birth:  29-Dec-1950 Medical Record Number:  [id number]   Reason for Referral:  hospital stroke follow up    SUBJECTIVE:   CHIEF COMPLAINT:  No chief complaint on file.   HPI:   Ms. [name] [name] is a 71 y.o. female with history of recent GI bleed, BRCA with mastectomy and HLD woh presented on 11/15/2021 with transient difficulties moving her eyes to focus her vision followed by transient aphasia.  personally reviewed hospitalization pertinent progress notes, lab work and imaging. MRI brain no acute infarct but did show chronic R BG, left thalamic and b/l cerebellum infarct. Etiology likely left brain TIA. LDL 80 - recommended increasing atorvastatin from 48m to 455mdaily. Recommended initiating aspirin 8127maily, no DAPT given recent lower GI bleed and anemia on 6/6. PT/OT no recommendations.        PERTINENT IMAGING  Per hospitalization 11/15/2021 CT head No acute abnormality.  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, posterior right lentiform nucleus and bilateral cerebellar hemispheres 2D Echo EF 60-81-77%rade 1 diastolic dysfunction, no atrial level shunt LDL 80 HgbA1c 5.4    ROS:   14 system review of systems performed and negative with exception of ***  PMH:  Past Medical History:  Diagnosis Date   Acute lower GI bleeding 10/25/2021   [place]10[place]   [place]  Diverticulosis of intestine with bleeding 10/25/2021   GERD (gastroesophageal reflux disease) 10/25/2021   Hyperlipidemia 10/25/2021   Macular degeneration    PONV (postoperative nausea and vomiting)     PSH:  Past Surgical History:  Procedure Laterality Date   ABDOMINAL HYSTERECTOMY     BREAST EXCISIONAL BIOPSY Right    BREAST  LUMPECTOMY WITH RADIOACTIVE SEED LOCALIZATION Right 01/18/2016   Procedure: RIGHT BREAST LUMPECTOMY WITH RADIOACTIVE SEED LOCALIZATION;  Surgeon: ThoErroll LunaD;  [facility];  Laterality: Right;   BREAST SURGERY     MASTECTOMY Left    TURBINATE REDUCTION      Social History:  Social History   Socioeconomic History   Marital status: Married    Spouse name: Not on file   Number of children: Not on file   Years of education: Not on file   Highest education level: Not on file  Occupational History   Not on file  Tobacco Use   Smoking status: Never   Smokeless tobacco: Never  Substance and Sexual Activity   Alcohol use: No   Drug use: No   Sexual activity: Not on file  Other Topics Concern   Not on file  Social History Narrative   Not on file   Social Determinants of Health   Financial Resource Strain: Not on file  Food Insecurity: Not on file  Transportation Needs: Not on file  Physical Activity: Not on file  Stress: Not on file  Social Connections: Not on file  Intimate Partner Violence: Not on file    Family History:  Family History  Problem Relation Age of Onset   Stomach cancer Father    Dementia Paternal Grandmother    Stroke Paternal Grandfather    BRCA 1/2 Neg Hx    Breast cancer Neg Hx     Medications:  Current Outpatient Medications on File Prior to Visit  Medication Sig Dispense Refill   aspirin EC 81 MG tablet Take 1 tablet (81 mg total) by mouth daily. Swallow whole. 30 tablet 11   atorvastatin (LIPITOR) 40 MG tablet Take 1 tablet (40 mg total) by mouth at bedtime. 30 tablet 11   CLARITIN 10 MG tablet Take 10 mg by mouth daily as needed (for seasonal allergies).     ferrous sulfate 325 (65 FE) MG tablet Take 1 tablet (325 mg total) by mouth 3 (three) times a week. 12 tablet 3   fluticasone (FLONASE) 50 MCG/ACT nasal spray Place 1-2 sprays into both nostrils daily as needed (for seasonal allergies).      folic acid (FOLVITE) 1 MG tablet Take 1 tablet (1 mg total) by mouth daily. Follow up your folate levels and anemia with your PCP. 30 tablet 3   NEXIUM 24HR 20 MG capsule Take 20 mg by mouth daily before breakfast.     OVER THE COUNTER MEDICATION Take 1 capsule by mouth See admin instructions. [place] with Astaxanthin capsules- Take 1 capsule by mouth every morning     OVER THE COUNTER MEDICATION Take 1 capsule by mouth See admin instructions. Kelli [place] Essential-1 with Vitamin D3-2000 capsules- Take 1 capsule by mouth once a day after breakfast     polyethylene glycol (MIRALAX / GLYCOLAX) 17 g packet Take 17 g by mouth daily. (Patient taking differently: Take 17 g by mouth every evening.) 14 each 0   Vitamin D, Ergocalciferol, (DRISDOL) 1.25 MG (50000 UNIT) CAPS capsule Take 50,000 Units by mouth See admin instructions. Take 50,000 units by mouth every Wednesday and Saturday     No current facility-administered medications on file prior to visit.    Allergies:  No Known Allergies    OBJECTIVE:  Physical Exam  There were no vitals filed for this visit. There is no height or weight on file to calculate BMI. No results found.      No data to display           General: well developed, well nourished, seated, in no evident distress Head: head normocephalic and atraumatic.   Neck: supple with no carotid or supraclavicular bruits Cardiovascular: regular rate and rhythm, no murmurs Musculoskeletal: no deformity Skin:  no rash/petichiae Vascular:  Normal pulses all extremities   Neurologic Exam Mental Status: Awake and fully alert. Oriented to place and time. Recent and remote memory intact. Attention span, concentration and fund of knowledge appropriate. Mood and affect appropriate.  Cranial Nerves: Fundoscopic exam reveals sharp disc margins. Pupils equal, briskly reactive to light. Extraocular movements full without nystagmus. Visual fields full  to confrontation. Hearing intact. Facial sensation intact. Face, tongue, palate moves normally and symmetrically.  Motor: Normal bulk and tone. Normal strength in all tested extremity muscles Sensory.: intact to touch , pinprick , position and vibratory sensation.  Coordination: Rapid alternating movements normal in all extremities. Finger-to-nose and heel-to-shin performed accurately bilaterally. Gait and Station: Arises from chair without difficulty. Stance is normal. Gait demonstrates normal stride length and balance with ***. Tandem walk and heel toe ***.  Reflexes: 1+ and symmetric. Toes downgoing.     NIHSS  *** Modified [place]in  ***      ASSESSMENT: [name] [name] is a 71 y.o. year old female with likely left brain TIA 11/15/2021. Vascular risk factors include hx of prior strokes on imaging, HLD and advanced age.  PLAN:  TIA : Hx of prior strokes on imaging:  Residual deficit: no residual deficit.  Continue aspirin 81 mg daily  and atorvastatin 11m daily  for secondary stroke prevention.   Discussed secondary stroke prevention measures and importance of close PCP follow up for aggressive stroke risk factor management including BP goal<130/90, HLD with LDL goal<70 and DM with A1c.<7 .  Stroke labs 10/2021: LDL 80, A1c 5.4 I have gone over the pathophysiology of stroke, warning signs and symptoms, risk factors and their management in some detail with instructions to go to the closest emergency room for symptoms of concern.     Follow up in *** or call earlier if needed   CC:  [facility] provider: Dr. SLeonie ManPCP: MKathyrn Lass MD    I spent *** minutes of face-to-face and non-face-to-face time with patient.  This included previsit chart review including review of recent hospitalization, lab review, study review, order entry, electronic health record documentation, patient education regarding recent stroke including etiology, secondary stroke prevention measures and importance  of managing stroke risk factors, residual deficits and typical recovery time and answered all other questions to patient satisfaction   JFrann Rider AGNP-BC  [facility] [address]SCape GirardeauGWades[place] [address] Phone [phone] [phone]: This document was prepared with digital dictation and possible smart phrase technology. Any transcriptional errors that result from this process are unintentional.

## 2021-12-22 ENCOUNTER — Ambulatory Visit: Payer: Medicare PPO | Admitting: Adult Health

## 2021-12-22 ENCOUNTER — Encounter: Payer: Self-pay | Admitting: Adult Health

## 2021-12-22 VITALS — BP 122/88 | HR 81 | Ht 64.5 in | Wt 189.2 lb

## 2021-12-22 DIAGNOSIS — G459 Transient cerebral ischemic attack, unspecified: Secondary | ICD-10-CM | POA: Diagnosis not present

## 2021-12-22 DIAGNOSIS — Z8673 Personal history of transient ischemic attack (TIA), and cerebral infarction without residual deficits: Secondary | ICD-10-CM

## 2021-12-22 DIAGNOSIS — Z09 Encounter for follow-up examination after completed treatment for conditions other than malignant neoplasm: Secondary | ICD-10-CM | POA: Diagnosis not present

## 2021-12-22 DIAGNOSIS — G4733 Obstructive sleep apnea (adult) (pediatric): Secondary | ICD-10-CM | POA: Diagnosis not present

## 2021-12-22 NOTE — Patient Instructions (Addendum)
Continue aspirin 81 mg daily  and atorvastatin '40mg'$  daily  for secondary stroke prevention  You will be called to schedule initial evaluation with one of our sleep doctors for evaluation of sleep apnea  We may consider doing an 30 day cardiac monitor to evaluate for possible atrial fibrillation in setting of recent TIA and prior strokes seem on imaging. Please ask GI about use of anticoagulant medication such as Eliquis as this would be the recommended treatment if atrial fibrillation was to be found.   Continue to follow up with PCP regarding cholesterol and blood pressure management  Maintain strict control of hypertension with blood pressure goal below 130/90 and cholesterol with LDL cholesterol (bad cholesterol) goal below 70 mg/dL.   Signs of a Stroke? Follow the BEFAST method:  Balance Watch for a sudden loss of balance, trouble with coordination or vertigo Eyes Is there a sudden loss of vision in one or both eyes? Or double vision?  Face: Ask the person to smile. Does one side of the face droop or is it numb?  Arms: Ask the person to raise both arms. Does one arm drift downward? Is there weakness or numbness of a leg? Speech: Ask the person to repeat a simple phrase. Does the speech sound slurred/strange? Is the person confused ? Time: If you observe any of these signs, call 911.        Thank you for coming to see [place] at [facility]. I hope we have been able to provide you high quality care today.  You may receive a patient satisfaction survey over the next few weeks. We would appreciate your feedback and comments so that we may continue to improve ourselves and the health of our patients.   Stroke Prevention Some medical conditions and lifestyle choices can lead to a higher risk for a stroke. You can help to prevent a stroke by eating healthy foods and exercising. It also helps to not smoke and to manage any health problems you may have. How can this condition  affect me? A stroke is an emergency. It should be treated right away. A stroke can lead to brain damage or threaten your life. There is a better chance of surviving and getting better after a stroke if you get medical help right away. What can increase my risk? The following medical conditions may increase your risk of a stroke: Diseases of the heart and blood vessels (cardiovascular disease). High blood pressure (hypertension). Diabetes. High cholesterol. Sickle cell disease. Problems with blood clotting. Being very overweight. Sleeping problems (obstructivesleep apnea). Other risk factors include: Being older than age 19. A history of blood clots, stroke, or mini-stroke (TIA). Race, ethnic background, or a family history of stroke. Smoking or using tobacco products. Taking birth control pills, especially if you smoke. Heavy alcohol and drug use. Not being active. What actions can I take to prevent this? Manage your health conditions High cholesterol. Eat a healthy diet. If this is not enough to manage your cholesterol, you may need to take medicines. Take medicines as told by your doctor. High blood pressure. Try to keep your blood pressure below 130/80. If your blood pressure cannot be managed through a healthy diet and regular exercise, you may need to take medicines. Take medicines as told by your doctor. Ask your doctor if you should check your blood pressure at home. Have your blood pressure checked every year. Diabetes. Eat a healthy diet and get regular exercise. If your blood sugar (glucose) cannot  be managed through diet and exercise, you may need to take medicines. Take medicines as told by your doctor. Talk to your doctor about getting checked for sleeping problems. Signs of a problem can include: Snoring a lot. Feeling very tired. Make sure that you manage any other conditions you have. Nutrition  Follow instructions from your doctor about what to eat or drink.  You may be told to: Eat and drink fewer calories each day. Limit how much salt (sodium) you use to 1,500 milligrams (mg) each day. Use only healthy fats for cooking, such as olive oil, canola oil, and sunflower oil. Eat healthy foods. To do this: Choose foods that are high in fiber. These include whole grains, and fresh fruits and vegetables. Eat at least 5 servings of fruits and vegetables a day. Try to fill one-half of your plate with fruits and vegetables at each meal. Choose low-fat (lean) proteins. These include low-fat cuts of meat, chicken without skin, fish, tofu, beans, and nuts. Eat low-fat dairy products. Avoid foods that: Are high in salt. Have saturated fat. Have trans fat. Have cholesterol. Are processed or pre-made. Count how many carbohydrates you eat and drink each day. Lifestyle If you drink alcohol: Limit how much you have to: 0-1 drink a day for women who are not pregnant. 0-2 drinks a day for men. Know how much alcohol is in your drink. In the [place] one drink equals one 12 oz bottle of beer (377m), one 5 oz glass of wine (1427m, or one 1 oz glass of hard liquor (4455m Do not smoke or use any products that have nicotine or tobacco. If you need help quitting, ask your doctor. Avoid secondhand smoke. Do not use drugs. Activity  Try to stay at a healthy weight. Get at least 30 minutes of exercise on most days, such as: Fast walking. Biking. Swimming. Medicines Take over-the-counter and prescription medicines only as told by your doctor. Avoid taking birth control pills. Talk to your doctor about the risks of taking birth control pills if: You are over 35 63ars old. You smoke. You get very bad headaches. You have had a blood clot. Where to find more information American Stroke Association: www.strokeassociation.org Get help right away if: You or a loved one has any signs of a stroke. "BE FAST" is an easy way to remember the warning signs: B - Balance.  Dizziness, sudden trouble walking, or loss of balance. E - Eyes. Trouble seeing or a change in how you see. F - Face. Sudden weakness or loss of feeling of the face. The face or eyelid may droop on one side. A - Arms. Weakness or loss of feeling in an arm. This happens all of a sudden and most often on one side of the body. S - Speech. Sudden trouble speaking, slurred speech, or trouble understanding what people say. T - Time. Time to call emergency services. Write down what time symptoms started. You or a loved one has other signs of a stroke, such as: A sudden, very bad headache with no known cause. Feeling like you may vomit (nausea). Vomiting. A seizure. These symptoms may be an emergency. Get help right away. Call your local emergency services (911 in the [place] Do not wait to see if the symptoms will go away. Do not drive yourself to the hospital. Summary You can help to prevent a stroke by eating healthy, exercising, and not smoking. It also helps to manage any health problems you have. Do not smoke or  use any products that contain nicotine or tobacco. Get help right away if you or a loved one has any signs of a stroke. This information is not intended to replace advice given to you by your health care provider. Make sure you discuss any questions you have with your health care provider. Document Revised: 12/08/2019 Document Reviewed: 12/08/2019 Elsevier Patient Education  [place].

## 2021-12-27 DIAGNOSIS — E559 Vitamin D deficiency, unspecified: Secondary | ICD-10-CM | POA: Diagnosis not present

## 2021-12-27 DIAGNOSIS — E782 Mixed hyperlipidemia: Secondary | ICD-10-CM | POA: Diagnosis not present

## 2021-12-27 DIAGNOSIS — G4733 Obstructive sleep apnea (adult) (pediatric): Secondary | ICD-10-CM | POA: Diagnosis not present

## 2021-12-27 DIAGNOSIS — R5383 Other fatigue: Secondary | ICD-10-CM | POA: Diagnosis not present

## 2021-12-27 DIAGNOSIS — Z6830 Body mass index (BMI) 30.0-30.9, adult: Secondary | ICD-10-CM | POA: Diagnosis not present

## 2021-12-27 DIAGNOSIS — R051 Acute cough: Secondary | ICD-10-CM | POA: Diagnosis not present

## 2021-12-27 DIAGNOSIS — Z8673 Personal history of transient ischemic attack (TIA), and cerebral infarction without residual deficits: Secondary | ICD-10-CM | POA: Diagnosis not present

## 2022-01-04 ENCOUNTER — Inpatient Hospital Stay: Payer: Medicare PPO

## 2022-01-04 ENCOUNTER — Encounter: Payer: Self-pay | Admitting: Cardiology

## 2022-01-04 ENCOUNTER — Ambulatory Visit: Payer: Medicare PPO | Admitting: Cardiology

## 2022-01-04 VITALS — BP 136/84 | HR 91 | Temp 97.9°F | Resp 16 | Ht 64.0 in | Wt 189.4 lb

## 2022-01-04 DIAGNOSIS — I10 Essential (primary) hypertension: Secondary | ICD-10-CM | POA: Diagnosis not present

## 2022-01-04 DIAGNOSIS — Z8673 Personal history of transient ischemic attack (TIA), and cerebral infarction without residual deficits: Secondary | ICD-10-CM | POA: Diagnosis not present

## 2022-01-04 DIAGNOSIS — I700 Atherosclerosis of aorta: Secondary | ICD-10-CM | POA: Diagnosis not present

## 2022-01-04 DIAGNOSIS — Z853 Personal history of malignant neoplasm of breast: Secondary | ICD-10-CM

## 2022-01-04 DIAGNOSIS — G459 Transient cerebral ischemic attack, unspecified: Secondary | ICD-10-CM | POA: Diagnosis not present

## 2022-01-04 DIAGNOSIS — E782 Mixed hyperlipidemia: Secondary | ICD-10-CM

## 2022-01-04 DIAGNOSIS — D509 Iron deficiency anemia, unspecified: Secondary | ICD-10-CM

## 2022-01-04 DIAGNOSIS — Z8719 Personal history of other diseases of the digestive system: Secondary | ICD-10-CM

## 2022-01-04 NOTE — Progress Notes (Signed)
ID:  [name] [name], DOB 1950-07-18, MRN [id number]  PCP:  [name], MD  Cardiologist:  Rex Kras, DO, [facility] (established care 01/04/2022)  REASON FOR CONSULT: Recent TIA/prior strokes  REQUESTING PHYSICIAN:  [name], MD [place],  [place] [address]  Chief Complaint  Patient presents with   Hx of TIA/stroke    Referred by [name], MD    HPI  [name] [name] is a 71 y.o. Caucasian female who presents to the clinic for cardiovascular evaluation status post stroke/TIA at the request of [name], MD. Her past medical history and cardiovascular risk factors include: Hyperlipidemia, obesity, obstructive sleep apnea, history of lower GI bleed, iron deficiency anemia, history of TIA and prior strokes, history of breast cancer status post right mastectomy, aortic atherosclerosis (CT scan 10/2021), a large hiatal hernia.  In June 2023 she was hospitalized for difficulty moving her eyes to focus and aphasia and was diagnosed with TIA.  Imaging studies revealed prior strokes and thereafter has been on higher dose of statin therapy and aspirin.  She was evaluated neurology who recommends 30-day event monitor and therefore referred to cardiology for further evaluation and management.  No residual deficits after recent TIA.  Denies anginal chest pain or heart failure symptoms.  ALLERGIES: No Known Allergies  MEDICATION LIST PRIOR TO VISIT: Current Meds  Medication Sig   albuterol (VENTOLIN HFA) 108 (90 Base) MCG/ACT inhaler Inhale 1-2 puffs into the lungs every 6 (six) hours as needed.   aspirin EC 81 MG tablet Take 1 tablet (81 mg total) by mouth daily. Swallow whole.   atorvastatin (LIPITOR) 40 MG tablet Take 1 tablet (40 mg total) by mouth at bedtime.   CLARITIN 10 MG tablet Take 10 mg by mouth daily as needed (for seasonal allergies).   ferrous sulfate 325 (65 FE) MG tablet Take 1 tablet (325 mg total) by mouth 3 (three) times a week.   fluticasone (FLONASE)  50 MCG/ACT nasal spray Place 1-2 sprays into both nostrils daily as needed (for seasonal allergies).   folic acid (FOLVITE) 1 MG tablet Take 1 tablet (1 mg total) by mouth daily. Follow up your folate levels and anemia with your PCP.   NEXIUM 24HR 20 MG capsule Take 20 mg by mouth daily before breakfast.   OVER THE COUNTER MEDICATION Take 1 capsule by mouth See admin instructions. [place] with Astaxanthin capsules- Take 1 capsule by mouth every morning   OVER THE COUNTER MEDICATION Take 1 capsule by mouth See admin instructions. [place] Essential-1 with Vitamin D3-2000 capsules- Take 1 capsule by mouth once a day after breakfast   polyethylene glycol (MIRALAX / GLYCOLAX) 17 g packet Take 17 g by mouth daily. (Patient taking differently: Take 17 g by mouth every evening.)   Vitamin D, Ergocalciferol, (DRISDOL) 1.25 MG (50000 UNIT) CAPS capsule Take 50,000 Units by mouth See admin instructions. Take 50,000 units by mouth every Wednesday and Saturday     PAST MEDICAL HISTORY: Past Medical History:  Diagnosis Date   Acute lower GI bleeding 10/25/2021   Aortic atherosclerosis ([place]) 10/25/2021   Cancer ([place])    Diverticulosis of intestine with bleeding 10/25/2021   GERD (gastroesophageal reflux disease) 10/25/2021   Hyperlipidemia 10/25/2021   Macular degeneration    PONV (postoperative nausea and vomiting)     PAST SURGICAL HISTORY: Past Surgical History:  Procedure Laterality Date   ABDOMINAL HYSTERECTOMY     BREAST EXCISIONAL BIOPSY Right  BREAST LUMPECTOMY WITH RADIOACTIVE SEED LOCALIZATION Right 01/18/2016   Procedure: RIGHT BREAST LUMPECTOMY WITH RADIOACTIVE SEED LOCALIZATION;  Surgeon: Erroll Luna, MD;  Location: [facility];  Service: General;  Laterality: Right;   BREAST SURGERY     MASTECTOMY Left    TURBINATE REDUCTION      FAMILY HISTORY: The patient family history includes Dementia in her paternal grandmother; Stomach cancer in  her father; Stroke in her paternal grandfather.  SOCIAL HISTORY:  The patient  reports that she has never smoked. She has never used smokeless tobacco. She reports that she does not drink alcohol and does not use drugs.  REVIEW OF SYSTEMS: Review of Systems  Cardiovascular:  Positive for dyspnea on exertion. Negative for chest pain, claudication, irregular heartbeat, leg swelling, near-syncope, orthopnea, palpitations, paroxysmal nocturnal dyspnea and syncope.  Respiratory:  Negative for shortness of breath.   Hematologic/Lymphatic: Negative for bleeding problem.  Musculoskeletal:  Negative for muscle cramps and myalgias.  Neurological:  Negative for dizziness and light-headedness.    PHYSICAL EXAM:    01/04/2022   10:38 AM 12/22/2021   10:01 AM 12/22/2021    9:06 AM  Vitals with BMI  Height '5\' 4"'$   5' 4.5"  Weight 189 lbs 6 oz  189 lbs 4 oz  BMI 70.26  37.85  Systolic 885 027 741  Diastolic 84 88 82  Pulse 91  81   CONSTITUTIONAL: Well-developed and well-nourished. No acute distress.  SKIN: Skin is warm and dry. No rash noted. No cyanosis. No pallor. No jaundice HEAD: Normocephalic and atraumatic.  EYES: No scleral icterus MOUTH/THROAT: Moist oral membranes.  NECK: No JVD present. No thyromegaly noted. No carotid bruits  CHEST Normal respiratory effort. No intercostal retractions  LUNGS: Clear to auscultation bilaterally.  No stridor.  Bilateral wheezes. No rales.  CARDIOVASCULAR: Regular rate and rhythm, positive S1-S2, no murmurs rubs or gallops appreciated. ABDOMINAL: Obese, soft, nontender, nondistended, positive bowel sounds all 4 quadrants. No apparent ascites.  EXTREMITIES: No peripheral edema, warm to touch, +2 bilateral DP and PT pulses HEMATOLOGIC: No significant bruising NEUROLOGIC: Oriented to person, place, and time. Nonfocal. Normal muscle tone.  PSYCHIATRIC: Normal mood and affect. Normal behavior. Cooperative  RADIOLOGY: CT angio GI bleed  protocol: 10/25/2021: 1. No evidence of acute abnormalities within the abdomen or pelvis. 2. Diffuse colonic diverticulosis without evidence of acute diverticulitis. 3. Large hiatal hernia. Aortic Atherosclerosis (ICD10-I70.0).  CT of the head without contrast: 11/15/2021 1. No evidence of acute intracranial abnormality. 2. Small remote appearing right cerebellar lacunar infarct. 3. Moderate paranasal sinus mucosal thickening.  MRI brain without contrast: 11/15/2021 1. Subtle 7 mm acute infarct versus artifact within the posterior right frontal lobe subcortical white matter. 2. Background mild cerebral white matter chronic small vessel ischemic disease. 3. Chronic lacunar infarcts within the posterior right lentiform nucleus and medial left thalamus. 4. Subcentimeter chronic infarcts within the bilateral cerebellar hemispheres. 5. Paranasal sinus disease, as described.  CARDIAC DATABASE: EKG: 01/04/2022: Sinus rhythm, 77bpm, without underlying ischemia or injury pattern.  Echocardiogram: 11/16/2021:  1. Left ventricular ejection fraction, by estimation, is 60 to 65%. The left ventricle has normal function. The left ventricle has no regional wall motion abnormalities. Left ventricular diastolic parameters are consistent with Grade I diastolic dysfunction (impaired relaxation).   2. Right ventricular systolic function is normal. The right ventricular size is normal.   3. The mitral valve is normal in structure. No evidence of mitral valve regurgitation. No evidence of mitral stenosis.  4. The aortic valve is normal in structure. Aortic valve regurgitation is not visualized. No aortic stenosis is present.   5. The IVC is normal in size with greater than 50% respiratory variability, suggesting right atrial pressure of 3 mmHg.  Conclusion(s)/Recommendation(s): No intracardiac source of embolism detected on this transthoracic study. Consider a transesophageal echocardiogram to exclude cardiac  source of embolism if clinically indicated.   Stress Testing: No results found for this or any previous visit from the past 1095 days.   Heart Catheterization: None  LABORATORY DATA:    Latest Ref Rng & Units 11/16/2021    2:07 PM 11/16/2021    2:52 AM 11/15/2021    8:17 PM  CBC  WBC 4.0 - 10.5 K/uL 6.0  7.0  8.5   Hemoglobin 12.0 - 15.0 g/dL 9.1  8.7  9.2   Hematocrit 36.0 - 46.0 % 28.3  27.5  29.5   Platelets 150 - 400 K/uL 457  448  491        Latest Ref Rng & Units 11/16/2021    2:54 AM 11/15/2021   11:30 AM 10/30/2021    4:18 AM  CMP  Glucose 70 - 99 mg/dL 105  119  99   BUN 8 - 23 mg/dL '9  10  9   '$ Creatinine 0.44 - 1.00 mg/dL 0.64  0.82  0.68   Sodium 135 - 145 mmol/L 139  137  141   Potassium 3.5 - 5.1 mmol/L 3.6  3.7  3.7   Chloride 98 - 111 mmol/L 107  107  109   CO2 22 - 32 mmol/L '23  22  26   '$ Calcium 8.9 - 10.3 mg/dL 8.9  8.9  8.4   Total Protein 6.5 - 8.1 g/dL 6.2  6.6    Total Bilirubin 0.3 - 1.2 mg/dL 0.4  0.4    Alkaline Phos 38 - 126 U/L 78  86    AST 15 - 41 U/L 15  18    ALT 0 - 44 U/L 15  17      Lipid Panel     Component Value Date/Time   CHOL 158 11/15/2021 1130   TRIG 143 11/15/2021 1130   HDL 49 11/15/2021 1130   CHOLHDL 3.2 11/15/2021 1130   VLDL 29 11/15/2021 1130   LDLCALC 80 11/15/2021 1130    No components found for: "NTPROBNP" No results for input(s): "PROBNP" in the last 8760 hours. Recent Labs    11/15/21 1130  TSH 1.110    BMP Recent Labs    10/30/21 0418 11/15/21 1130 11/16/21 0254  NA 141 137 139  K 3.7 3.7 3.6  CL 109 107 107  CO2 '26 22 23  '$ GLUCOSE 99 119* 105*  BUN '9 10 9  '$ CREATININE 0.68 0.82 0.64  CALCIUM 8.4* 8.9 8.9  GFRNONAA >60 >60 >60    HEMOGLOBIN A1C Lab Results  Component Value Date   HGBA1C 5.4 11/15/2021   MPG 108 11/15/2021    IMPRESSION:    ICD-10-CM   1. TIA (transient ischemic attack)  G45.9 EKG 12-Lead    LONG TERM MONITOR (3-14 DAYS)    [facility] MYOCARDIAL PERFUSION WO LEXISCAN     2. History of stroke  Z86.73 LONG TERM MONITOR (3-14 DAYS)    [facility] MYOCARDIAL PERFUSION WO LEXISCAN    3. Mixed hyperlipidemia  E78.2 PC[facility] MYOCARDIAL PERFUSION WO LEXISCAN    4. Aortic atherosclerosis ([facility])  I70.0 [facility] MYOCARDIAL PERFUSION WO LEXISCAN    5. History  of GI bleed  Z87.19     6. Iron deficiency anemia, unspecified iron deficiency anemia type  D50.9     7. HX: breast cancer  Z85.3        RECOMMENDATIONS: [name] [name] is a 71 y.o. Caucasian female whose past medical history and cardiac risk factors include: Hyperlipidemia, obesity, obstructive sleep apnea, history of lower GI bleed, iron deficiency anemia, history of TIA and prior strokes, history of breast cancer status post right mastectomy, aortic atherosclerosis (CT scan 10/2021), a large hiatal hernia.  TIA (transient ischemic attack) / History of stroke Index event in June 2023. Recent radiological studies were performed reviewed as part of today's encounter. She has had prior bilateral strokes Echo: Preserved LVEF, no intra-atrial shunting per color Doppler per report. We discussed undergoing transesophageal echocardiogram to rule out cardiac source of her recent TIA/prior strokes. Recommended the importance of screening for atrial fibrillation.  We discussed extended Holter monitor as well as loop recorder implantation.  Patient would like to proceed with 2 week extended Holter monitor.  Patient is asked to keep a log of her blood pressures and to bring it in at the next office visit. Agree with up titration of statin therapy. Currently on aspirin 81 mg p.o. daily. Patient has a follow-up appointment with GI in September 2023 given her history of anemia and GI bleed.  Mixed hyperlipidemia Currently on Lipitor.   She denies myalgia or other side effects. Most recent lipids dated June 2023, independently reviewed as noted above. Recommend a goal LDL <70 mg/dL. Currently managed by primary care provider.  Given  her recent TIA and prior strokes and multiple cardiovascular risk factors that shared decision was to proceed with exercise treadmill stress test to evaluate for reversible ischemia and functional capacity.  Patient is able to exercise and EKG is interpretable.  Data Reviewed: I have independently reviewed external notes provided by the referring provider via [facility].  I have independently reviewed results of CT and MRI scans, discharge summary, echo, EKG as part of medical decision making. I have ordered the following tests:  Orders Placed This Encounter  Procedures   LONG TERM MONITOR (3-14 DAYS)    Standing Status:   Future    Standing Expiration Date:   01/05/2023    Order Specific Question:   Where should this test be performed?    Answer:   [facility]    Order Specific Question:   Does the patient have an implanted cardiac device?    Answer:   No    Order Specific Question:   Prescribed days of wear    Answer:   64    Order Specific Question:   Type of enrollment    Answer:   Clinic Enrollment   [facility] MYOCARDIAL PERFUSION WO LEXISCAN    Standing Status:   Future    Standing Expiration Date:   01/05/2023   EKG 12-Lead  I have made no medications changes at today's encounter as noted above.  FINAL MEDICATION LIST END OF ENCOUNTER: No orders of the defined types were placed in this encounter.   There are no discontinued medications.   Current Outpatient Medications:    albuterol (VENTOLIN HFA) 108 (90 Base) MCG/ACT inhaler, Inhale 1-2 puffs into the lungs every 6 (six) hours as needed., Disp: , Rfl:    aspirin EC 81 MG tablet, Take 1 tablet (81 mg total) by mouth daily. Swallow whole., Disp: 30 tablet, Rfl: 11   atorvastatin (LIPITOR) 40 MG  tablet, Take 1 tablet (40 mg total) by mouth at bedtime., Disp: 30 tablet, Rfl: 11   CLARITIN 10 MG tablet, Take 10 mg by mouth daily as needed (for seasonal allergies)., Disp: , Rfl:    ferrous sulfate 325 (65 FE) MG tablet,  Take 1 tablet (325 mg total) by mouth 3 (three) times a week., Disp: 12 tablet, Rfl: 3   fluticasone (FLONASE) 50 MCG/ACT nasal spray, Place 1-2 sprays into both nostrils daily as needed (for seasonal allergies)., Disp: , Rfl:    folic acid (FOLVITE) 1 MG tablet, Take 1 tablet (1 mg total) by mouth daily. Follow up your folate levels and anemia with your PCP., Disp: 30 tablet, Rfl: 3   NEXIUM 24HR 20 MG capsule, Take 20 mg by mouth daily before breakfast., Disp: , Rfl:    OVER THE COUNTER MEDICATION, Take 1 capsule by mouth See admin instructions. [place] with Astaxanthin capsules- Take 1 capsule by mouth every morning, Disp: , Rfl:    OVER THE COUNTER MEDICATION, Take 1 capsule by mouth See admin instructions. Kelli Hope Essential-1 with Vitamin D3-2000 capsules- Take 1 capsule by mouth once a day after breakfast, Disp: , Rfl:    polyethylene glycol (MIRALAX / GLYCOLAX) 17 g packet, Take 17 g by mouth daily. (Patient taking differently: Take 17 g by mouth every evening.), Disp: 14 each, Rfl: 0   Vitamin D, Ergocalciferol, (DRISDOL) 1.25 MG (50000 UNIT) CAPS capsule, Take 50,000 Units by mouth See admin instructions. Take 50,000 units by mouth every Wednesday and Saturday, Disp: , Rfl:   Orders Placed This Encounter  Procedures   LONG TERM MONITOR (3-14 DAYS)   [facility] MYOCARDIAL PERFUSION WO LEXISCAN   EKG 12-Lead    There are no Patient Instructions on file for this visit.   --Continue cardiac medications as reconciled in final medication list. --Return in about 6 weeks (around 02/15/2022) for Follow up stroke workup and review test results. . or sooner if needed. --Continue follow-up with your primary care physician regarding the management of your other chronic comorbid conditions.  Patient's questions and concerns were addressed to her satisfaction. She voices understanding of the instructions provided during this encounter.   This note was created using a  voice recognition software as a result there may be grammatical errors inadvertently enclosed that do not reflect the nature of this encounter. Every attempt is made to correct such errors.  Rex Kras, [place], [facility]  Pager: [phone] Office: [phone]

## 2022-01-09 ENCOUNTER — Encounter: Payer: Self-pay | Admitting: *Deleted

## 2022-01-10 ENCOUNTER — Ambulatory Visit: Payer: Medicare PPO | Admitting: Neurology

## 2022-01-10 ENCOUNTER — Encounter: Payer: Self-pay | Admitting: Neurology

## 2022-01-10 VITALS — BP 140/77 | HR 81 | Ht 64.0 in | Wt 187.6 lb

## 2022-01-10 DIAGNOSIS — R635 Abnormal weight gain: Secondary | ICD-10-CM

## 2022-01-10 DIAGNOSIS — Z8673 Personal history of transient ischemic attack (TIA), and cerebral infarction without residual deficits: Secondary | ICD-10-CM | POA: Diagnosis not present

## 2022-01-10 DIAGNOSIS — G459 Transient cerebral ischemic attack, unspecified: Secondary | ICD-10-CM

## 2022-01-10 DIAGNOSIS — E669 Obesity, unspecified: Secondary | ICD-10-CM | POA: Diagnosis not present

## 2022-01-10 DIAGNOSIS — R351 Nocturia: Secondary | ICD-10-CM | POA: Diagnosis not present

## 2022-01-10 DIAGNOSIS — G4733 Obstructive sleep apnea (adult) (pediatric): Secondary | ICD-10-CM | POA: Diagnosis not present

## 2022-01-10 NOTE — Progress Notes (Signed)
nocSubjective:    Patient ID: [name] [name] is a 71 y.o. female.  HPI    Star Age, MD, PhD [facility] [address]O. [place], [place] [address]  Dear Janett Billow and Mamie Nick,   I saw your patient, [name] [name], upon your kind request, in my Sleep clinic today for initial consultation of her sleep disorder, in particular, evaluation of her prior diagnosis of obstructive sleep apnea.  The patient is unaccompanied today.  As you know, [name] [name] is a 71 year old right-handed woman with an underlying medical history of stroke, recent TIA, diverticulosis, lower GI bleed, macular degeneration, reflux disease, hyperlipidemia, breast cancer with status post mastectomy, and mildly overweight state, who was previously diagnosed with obstructive sleep apnea and placed on PAP therapy.  Prior sleep study results are not available for my review today. Testing was through [facility] sleep medicine in 2016 or 2017 and she had a machine since then.  She reports that she quit using her CPAP after summer 2020 as she had to leave town to be with her son and his family to help them out as he had COVID and was in quarantine.  When she came back from [place] she had a hard time getting back on track with her CPAP machine.  I was able to review her download from 10/23/2018 through 01/20/2019, during which time she used her machine fairly consistently for 19 days straight but no usage after 11/03/2018.  Her DME company was [employer].  She has no family history of sleep apnea.  She has been encouraged to get back on CPAP particularly by her cardiologist.  She reports an Epworth sleepiness score of 3 out of 24, fatigue severity score of 61 out of 63.  She has had weight gain over time, particularly after COVID started, she gained about 30 pounds altogether.  She is trying to lose weight.  She retired earlier this month.  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  She has a follow-up with GI, she has a history of GI bleed earlier this year.  Bedtime is generally between 10 and 10:30 PM and rise time generally between 6:30 AM and 7:30 AM.  She has nocturia about twice per average night and denies recurrent morning headaches.  She feels that CPAP did help her when she first got on the machine, her pressure was averaging 11 cm on the latest download, pressure range of 4-16, she was on AutoPap, ResMed AirSense 10 AutoSet machine.  She drinks caffeine occasionally, has reduced her soda intake.  She drinks alcohol rarely, she is a non-smoker.  Her Past Medical History Is Significant For: Past Medical History:  Diagnosis Date   Acute lower GI bleeding 10/25/2021   Aortic atherosclerosis ([place]) 10/25/2021   Cancer ([place])    Diverticulosis of intestine with bleeding 10/25/2021   GERD (gastroesophageal reflux disease) 10/25/2021   Hyperlipidemia 10/25/2021   Macular degeneration    PONV (postoperative nausea and vomiting)     Her Past Surgical History Is Significant For: Past Surgical History:  Procedure Laterality Date   ABDOMINAL HYSTERECTOMY     BREAST EXCISIONAL BIOPSY Right    BREAST LUMPECTOMY WITH RADIOACTIVE SEED LOCALIZATION Right 01/18/2016   Procedure: RIGHT BREAST LUMPECTOMY WITH RADIOACTIVE SEED LOCALIZATION;  Surgeon: Erroll Luna, MD;  Location: [place];  Service: General;  Laterality: Right;   BREAST SURGERY     MASTECTOMY  Left    TURBINATE REDUCTION      Her Family History Is Significant For: Family History  Problem Relation Age of Onset   Stomach cancer Father    Dementia Paternal Grandmother    Stroke Paternal Grandfather    BRCA 1/2 Neg Hx    Breast cancer Neg Hx    Sleep apnea Neg Hx     Her Social History Is Significant For: Social History   Socioeconomic History   Marital status: Widowed    Spouse name: Not on file   Number of children: Not on file   Years of education: Not  on file   Highest education level: Not on file  Occupational History   Not on file  Tobacco Use   Smoking status: Never   Smokeless tobacco: Never  Vaping Use   Vaping Use: Never used  Substance and Sexual Activity   Alcohol use: No   Drug use: No   Sexual activity: Not on file  Other Topics Concern   Not on file  Social History Narrative   Not on file   Social Determinants of Health   Financial Resource Strain: Not on file  Food Insecurity: Not on file  Transportation Needs: Not on file  Physical Activity: Not on file  Stress: Not on file  Social Connections: Not on file    Her Allergies Are:  No Known Allergies:   Her Current Medications Are:  Outpatient Encounter Medications as of 01/10/2022  Medication Sig   albuterol (VENTOLIN HFA) 108 (90 Base) MCG/ACT inhaler Inhale 1-2 puffs into the lungs every 6 (six) hours as needed.   aspirin EC 81 MG tablet Take 1 tablet (81 mg total) by mouth daily. Swallow whole.   atorvastatin (LIPITOR) 40 MG tablet Take 1 tablet (40 mg total) by mouth at bedtime.   CLARITIN 10 MG tablet Take 10 mg by mouth daily as needed (for seasonal allergies).   ferrous sulfate 325 (65 FE) MG tablet Take 1 tablet (325 mg total) by mouth 3 (three) times a week.   fluticasone (FLONASE) 50 MCG/ACT nasal spray Place 1-2 sprays into both nostrils daily as needed (for seasonal allergies).   folic acid (FOLVITE) 1 MG tablet Take 1 tablet (1 mg total) by mouth daily. Follow up your folate levels and anemia with your PCP.   NEXIUM 24HR 20 MG capsule Take 20 mg by mouth daily before breakfast.   OVER THE COUNTER MEDICATION Take 1 capsule by mouth See admin instructions. Basco with Astaxanthin capsules- Take 1 capsule by mouth every morning   OVER THE COUNTER MEDICATION Take 1 capsule by mouth See admin instructions. Kelli Hope Essential-1 with Vitamin D3-2000 capsules- Take 1 capsule by mouth once a day after breakfast    polyethylene glycol (MIRALAX / GLYCOLAX) 17 g packet Take 17 g by mouth daily. (Patient taking differently: Take 17 g by mouth every evening.)   Vitamin D, Ergocalciferol, (DRISDOL) 1.25 MG (50000 UNIT) CAPS capsule Take 50,000 Units by mouth See admin instructions. Take 50,000 units by mouth every Wednesday and Saturday   No facility-administered encounter medications on file as of 01/10/2022.  :   Review of Systems:  Out of a complete 14 point review of systems, all are reviewed and negative with the exception of these symptoms as listed below:  Review of Systems  Neurological:        Pt here for sleep consult  Pt snores, fatigue , Pt denies hypertension,headaches  Pt  states sleep study 2017 ,CPAP machine 2017 . Pt has not used CPAP since 2020. Pt states stopped using CPAP machine because son got covid and she was out of state for a awhile . When she came back hard to adjust back on CPAP machine . Pt states stroke June 2023     ESS:3 FSS:61    Objective:  Neurological Exam  Physical Exam Physical Examination:   Vitals:   01/10/22 1024  BP: (!) 140/77  Pulse: 81    General Examination: The patient is a very pleasant 71 y.o. female in no acute distress. She appears well-developed and well-nourished and well groomed.   HEENT: Normocephalic, atraumatic, pupils are equal, round and reactive to light, extraocular tracking is good without limitation to gaze excursion or nystagmus noted. Hearing is grossly intact. Face is symmetric with normal facial animation. Speech is clear with no dysarthria noted. There is no hypophonia. There is no lip, neck/head, jaw or voice tremor. Neck is supple with full range of passive and active motion. There are no carotid bruits on auscultation. Oropharynx exam reveals: mild mouth dryness, adequate dental hygiene and moderate airway crowding secondary to small airway entry.  Mallampati class III.  Small tonsils noted, she has a vascular growth/swelling on  the left side of the tip of the tongue, this has been stable per patient for years.  Monitored by her dentist.  She has a minimal overbite.  Tongue protrudes centrally and palate elevates symmetrically.    Chest: Clear to auscultation without wheezing, rhonchi or crackles noted.  Heart: S1+S2+0, regular and normal without murmurs, rubs or gallops noted.   Abdomen: Soft, non-tender and non-distended.  Extremities: There is nonpitting puffiness both distal lower extremities.    Skin: Warm and dry without trophic changes noted.   Musculoskeletal: exam reveals no obvious joint deformities, tenderness or joint swelling or erythema.   Neurologically:  Mental status: The patient is awake, alert and oriented in all 4 spheres. Her immediate and remote memory, attention, language skills and fund of knowledge are appropriate. There is no evidence of aphasia, agnosia, apraxia or anomia. Speech is clear with normal prosody and enunciation. Thought process is linear. Mood is normal and affect is normal.  Cranial nerves II - XII are as described above under HEENT exam.  Motor exam: Normal bulk, strength and tone is noted. There is no obvious tremor. Fine motor skills and coordination: grossly intact.  Cerebellar testing: No dysmetria or intention tremor. There is no truncal or gait ataxia.  Sensory exam: intact to light touch in the upper and lower extremities.  Gait, station and balance: She stands easily. No veering to one side is noted. No leaning to one side is noted. Posture is age-appropriate and stance is narrow based. Gait shows normal stride length and normal pace. No problems turning are noted.   Assessment and Plan:   In summary, DEMARI KROPP is a very pleasant 71 y.o.-year old female with an underlying medical history of stroke, recent TIA, diverticulosis, lower GI bleed, macular degeneration, reflux disease, hyperlipidemia, breast cancer with status post mastectomy, and mildly overweight  state, who presents for evaluation of her prior diagnosis of obstructive sleep apnea.  She had sleep testing in 2016 or 17.  She had a CPAP machine which she essentially stopped using after summer 2020.  She would be eligible for new equipment at this moment and should proceed with reevaluation especially since she also had weight gain over time.  She will try  to get prior records from [place] if possible.   I had a long chat with the patient about my findings and the diagnosis of sleep apnea, particularly OSA, its prognosis and treatment options. We talked about medical/conservative treatments, surgical interventions and non-pharmacological approaches for symptom control. I explained, in particular, the risks and ramifications of untreated moderate to severe OSA, especially with respect to developing cardiovascular disease down the road, including congestive heart failure (CHF), difficult to treat hypertension, cardiac arrhythmias (particularly A-fib), neurovascular complications including TIA, stroke and dementia. Even type 2 diabetes has, in part, been linked to untreated OSA. Symptoms of untreated OSA may include (but may not be limited to) daytime sleepiness, nocturia (i.e. frequent nighttime urination), memory problems, mood irritability and suboptimally controlled or worsening mood disorder such as depression and/or anxiety, lack of energy, lack of motivation, physical discomfort, as well as recurrent headaches, especially morning or nocturnal headaches. We talked about the importance of maintaining a healthy lifestyle and striving for healthy weight. In addition, we talked about the importance of striving for and maintaining good sleep hygiene. I recommended the following at this time: sleep study.  I outlined the differences between a laboratory attended sleep study which is considered more comprehensive and accurate over the option of a home sleep test (HST); the latter may lead to  underestimation of sleep disordered breathing in some instances and does not help with diagnosing upper airway resistance syndrome and is not accurate enough to diagnose primary central sleep apnea typically. I explained the different sleep test procedures to the patient in detail and also outlined possible surgical and non-surgical treatment options of OSA, including the use of a pressure airway pressure (PAP) device (ie CPAP, AutoPAP/APAP or BiPAP in certain circumstances), a custom-made dental device (aka oral appliance, which would require a referral to a specialist dentist or orthodontist typically, and is generally speaking not considered a good choice for patients with full dentures or edentulous state), upper airway surgical options, such as traditional UPPP (which is not considered a first-line treatment) or the Inspire device (hypoglossal nerve stimulator, which would involve a referral for consultation with an ENT surgeon, after careful selection, following inclusion criteria). I explained the PAP treatment option to the patient in detail, as this is generally considered first-line treatment.  The patient indicated that she would be willing to try PAP therapy again, if the need arises. I explained the importance of being compliant with PAP treatment, not only for insurance purposes but primarily to improve patient's symptoms symptoms, and for the patient's long term health benefit, including to reduce Her cardiovascular risks longer-term.  She is advised to follow-up in your clinic for stroke and TIA follow-up as well as for any future neurological new symptoms.  She is advised to follow-up with GI. We will pick up our discussion about the next steps and treatment options after testing.  We will keep her posted as to the test results by phone call and/or MyChart messaging where possible.  We will plan to follow-up in sleep clinic accordingly as well.  I answered all her questions today and the patient  was in agreement.   I encouraged her to call with any interim questions, concerns, problems or updates or email [place] through [place].  Generally speaking, sleep test authorizations may take up to 2 weeks, sometimes less, sometimes longer, the patient is encouraged to get in touch with [place] if they do not hear back from the sleep lab staff directly within the  next 2 weeks.  Thank you very much for allowing me to participate in the care of this nice patient. If I can be of any further assistance to you please do not hesitate to call me at [phone].  Sincerely,   Star Age, MD, PhD

## 2022-01-10 NOTE — Patient Instructions (Signed)
Thank you for choosing [facility] for your sleep related care! It was nice to meet you today!   Here is what we discussed today:    Based on your symptoms and your exam I believe you may still be at risk for obstructive sleep apnea (aka OSA). We should proceed with a sleep study to determine whether you do or do not have OSA and how severe it is. Even, if you have mild OSA, I may want you to consider treatment with CPAP, as treatment of even borderline or mild sleep apnea can result and improvement of symptoms such as sleep disruption, daytime sleepiness, nighttime bathroom breaks, restless leg symptoms, improvement of headache syndromes, even improved mood disorder.   As explained, an attended sleep study (meaning you get to stay overnight in the sleep lab), lets [place] monitor sleep-related behaviors such as sleep talking and leg movements in sleep, in addition to monitoring for sleep apnea.  A home sleep test is a screening tool for sleep apnea diagnosis only, but unfortunately, does not help with any other sleep-related diagnoses.  Please remember, the long-term risks and ramifications of untreated moderate to severe obstructive sleep apnea may include (but are not limited to): increased risk for cardiovascular disease, including congestive heart failure, stroke, difficult to control hypertension, treatment resistant obesity, arrhythmias, especially irregular heartbeat commonly known as A. Fib. (atrial fibrillation); even type 2 diabetes has been linked to untreated OSA.   Other correlations that untreated obstructive sleep apnea include macular edema which is swelling of the retina in the eyes, droopy eyelid syndrome, and elevated hemoglobin and hematocrit levels (often referred to as polycythemia).  Sleep apnea can cause disruption of sleep and sleep deprivation in most cases, which, in turn, can cause recurrent headaches, problems with memory, mood, concentration, focus, and  vigilance. Most people with untreated sleep apnea report excessive daytime sleepiness, which can affect their ability to drive. Please do not drive or use heavy equipment or machinery, if you feel sleepy! Patients with sleep apnea can also develop difficulty initiating and maintaining sleep (aka insomnia).   Having sleep apnea may increase your risk for other sleep disorders, including involuntary behaviors sleep such as sleep terrors, sleep talking, sleepwalking.    Having sleep apnea can also increase your risk for restless leg syndrome and leg movements at night.   Please note that untreated obstructive sleep apnea may carry additional perioperative morbidity. Patients with significant obstructive sleep apnea (typically, in the moderate to severe degree) should receive, if possible, perioperative PAP (positive airway pressure) therapy and the surgeons and particularly the anesthesiologists should be informed of the diagnosis and the severity of the sleep disordered breathing.   We will call you or email you through [place] with regards to your test results and plan a follow-up in sleep clinic accordingly. Most likely, you will hear from one of our nurses.   Our sleep lab administrative assistant will call you to schedule your sleep study and give you further instructions, regarding the check in process for the sleep study, arrival time, what to bring, when you can expect to leave after the study, etc., and to answer any other logistical questions you may have. If you don't hear back from her by about 2 weeks from now, please feel free to call her direct line at [phone] or you can call our general clinic number, or email [place] through My Chart.

## 2022-01-11 ENCOUNTER — Encounter (HOSPITAL_COMMUNITY): Payer: Self-pay | Admitting: Cardiology

## 2022-01-18 ENCOUNTER — Ambulatory Visit (HOSPITAL_COMMUNITY): Admission: RE | Admit: 2022-01-18 | Payer: Medicare PPO | Source: Ambulatory Visit | Admitting: Cardiology

## 2022-01-18 SURGERY — ECHOCARDIOGRAM, TRANSESOPHAGEAL
Anesthesia: Monitor Anesthesia Care

## 2022-01-24 ENCOUNTER — Telehealth: Payer: Self-pay | Admitting: Neurology

## 2022-01-24 ENCOUNTER — Ambulatory Visit: Payer: Medicare PPO

## 2022-01-24 DIAGNOSIS — Z8673 Personal history of transient ischemic attack (TIA), and cerebral infarction without residual deficits: Secondary | ICD-10-CM

## 2022-01-24 DIAGNOSIS — E782 Mixed hyperlipidemia: Secondary | ICD-10-CM

## 2022-01-24 DIAGNOSIS — I700 Atherosclerosis of aorta: Secondary | ICD-10-CM | POA: Diagnosis not present

## 2022-01-24 DIAGNOSIS — G459 Transient cerebral ischemic attack, unspecified: Secondary | ICD-10-CM | POA: Diagnosis not present

## 2022-01-24 NOTE — Telephone Encounter (Signed)
[employer] pending uploaded notes on the portal  

## 2022-01-26 NOTE — Telephone Encounter (Signed)
[employer]- [employer] Josem Kaufmann: [id number] (exp. 01/24/22 to 04/24/22).  Patient is scheduled at [facility] for 02/14/22 at 8 pm.  Mailed packet to the patient.

## 2022-01-31 DIAGNOSIS — D509 Iron deficiency anemia, unspecified: Secondary | ICD-10-CM | POA: Diagnosis not present

## 2022-02-14 ENCOUNTER — Ambulatory Visit (INDEPENDENT_AMBULATORY_CARE_PROVIDER_SITE_OTHER): Payer: Medicare PPO | Admitting: Neurology

## 2022-02-14 DIAGNOSIS — G4761 Periodic limb movement disorder: Secondary | ICD-10-CM

## 2022-02-14 DIAGNOSIS — Z8673 Personal history of transient ischemic attack (TIA), and cerebral infarction without residual deficits: Secondary | ICD-10-CM

## 2022-02-14 DIAGNOSIS — E669 Obesity, unspecified: Secondary | ICD-10-CM

## 2022-02-14 DIAGNOSIS — G4720 Circadian rhythm sleep disorder, unspecified type: Secondary | ICD-10-CM

## 2022-02-14 DIAGNOSIS — G4733 Obstructive sleep apnea (adult) (pediatric): Secondary | ICD-10-CM

## 2022-02-14 DIAGNOSIS — R635 Abnormal weight gain: Secondary | ICD-10-CM

## 2022-02-14 DIAGNOSIS — G459 Transient cerebral ischemic attack, unspecified: Secondary | ICD-10-CM

## 2022-02-14 DIAGNOSIS — R351 Nocturia: Secondary | ICD-10-CM

## 2022-02-16 ENCOUNTER — Ambulatory Visit: Payer: Medicare PPO | Admitting: Cardiology

## 2022-02-21 NOTE — Addendum Note (Signed)
Addended by: Star Age on: 02/21/2022 07:12 PM   Modules accepted: Orders

## 2022-02-21 NOTE — Procedures (Signed)
[facility] POLYSOMNOGRAPHY  INTERPRETATION REPORT   STUDY DATE:  02/14/2022     PATIENT NAME:  [name] [name]         DATE OF BIRTH:  [birth year]  PATIENT ID:  [id number]    TYPE OF STUDY:  PSG  READING PHYSICIAN: Star Age, MD, PhD REFERRED BY: Frann Rider SCORING TECHNICIAN: Forde Radon, RPSGT   HISTORY: 71 year old female with a history of stroke, recent TIA, diverticulosis, lower GI bleed, macular degeneration, reflux disease, hyperlipidemia, breast cancer with status post mastectomy, and mildly overweight state, who was previously diagnosed with obstructive sleep apnea and placed on PAP therapy. ?She no longer uses her CPAP machine. She has been encouraged to get back on CPAP particularly by her cardiologist. Her Epworth sleepiness score is 3 out of 24, fatigue severity score of 61 out of 63.  Height: 64 in Weight: 187 lb (BMI 32).   MEDICATIONS: Ventolin, Aspirin, Lipitor, Vitamin D, Nexium, Ferrous Sulfate,Flonase, Folvite, Claritin, Miralax TECHNICAL DESCRIPTION: A registered sleep technologist was in attendance for the duration of the recording.  Data collection, scoring, video monitoring, and reporting were performed in compliance with the AASM Manual for the Scoring of Sleep and Associated Events; (Hypopnea is scored based on the criteria listed in Section VIII D. 1b in the AASM Manual V2.6 using a 4% oxygen desaturation rule or Hypopnea is scored based on the criteria listed in Section VIII D. 1a in the AASM Manual V2.6 using 3% oxygen desaturation and /or arousal rule).   SLEEP CONTINUITY AND SLEEP ARCHITECTURE:  Lights-out was at 20:41: and lights-on at  05:11, with a total recording time of 8 hours, 30 minutes. Total sleep time ( TST) was 244.5 minutes with a markedly decreased sleep efficiency at 47.9%.  BODY POSITION:  TST was divided  between the following sleep positions: 32.3% supine;  67.7% lateral;  0% prone. Duration of total sleep and  percent of total sleep in their respective position is as follows: supine 79 minutes (32%), non-supine 166 minutes (68%); right 116 minutes (48%), left 49 minutes (20%), and prone 00 minutes (0%).  Total supine REM sleep time was 22 minutes (57% of total REM sleep).  Sleep latency was normal at 15.5 minutes.  REM sleep latency was increased at 176.5 minutes. Of the total sleep time, the percentage of stage N1 sleep was 4.1%, stage N2 sleep was 64%, which is increased, stage N3 sleep was 16.4%, which is normal, and REM sleep was 15.7%, which is reduced. Wake after sleep onset (WASO) time accounted for 250 minutes with moderate sleep fragmentation noted.   RESPIRATORY MONITORING:   Based on CMS criteria (using a 4% oxygen desaturation rule for scoring hypopneas), there were 9 apneas (9 obstructive; 0 central; 0 mixed), and 46 hypopneas.  Apnea index was 2.2. Hypopnea index was 11.3. The apnea-hypopnea index was 13.5/hour overall (22.8 supine, 36 non-supine; 40.5 REM, 43.6 supine REM).  There were 0 respiratory effort-related arousals (RERAs).  The RERA index was 0 events/h. Total respiratory disturbance index (RDI) was 13.5 events/h. RDI results showed: supine RDI  22.8 /h; non-supine RDI 9.1 /h; REM RDI 40.5 /h, supine REM RDI 43.6 /h.   Based on AASM criteria (using a 3% oxygen desaturation and /or arousal rule for scoring hypopneas), there were 9 apneas (9 obstructive; 0 central; 0 mixed), and 47 hypopneas. Apnea index was 2.2. Hypopnea index was 11.5. The apnea-hypopnea index was 13.7 overall (23.5 supine, 36 non-supine; 42.1 REM, 46.4 supine REM).  There were 0 respiratory effort-related arousals (RERAs).  The RERA index was 0 events/h. Total respiratory disturbance index (RDI) was 13.7 events/h. RDI results showed: supine RDI  23.5 /h; non-supine RDI 9.1 /h; REM RDI 42.1 /h, supine REM RDI 46.4 /h.  OXIMETRY: Oxyhemoglobin Saturation Nadir during sleep was at  80%) from a mean of 91%.  Of the Total  sleep time (TST)   hypoxemia (=<88%) was present for  13.4 minutes, or 5.5% of total sleep time.  LIMB MOVEMENTS: There were 149 periodic limb movements of sleep (36.6/hr), of which 5 (1.2/hr) were associated with an arousal. AROUSAL: There were 20 arousals in total, for an arousal index of 5 arousals/hour.  Of these, 10 were identified as respiratory-related arousals (2 /h), 5 were PLM-related arousals (1 /h), and 25 were non-specific arousals (6 /h). Snoring was classified as mild to moderate. EEG:  The EEG was of normal amplitude and frequency, with symmetric manifestation of sleep stages. EKG: The electrocardiogram showed normal sinus rhythm. The average heart rate during sleep was 77 bpm.  The heart rate during sleep varied between a minimum of Tachycardia and  a maximum of  93 bpm. AUDIO and VIDEO: The video and audio analysis did not show any abnormal or unusual behaviors, movements, phonations or vocalizations. The patient took 1 bathroom break. Post study, the patient indicated, that sleep was the same as usual.  IMPRESSION: Obstructive sleep apnea (OSA) PLMD (periodic limb movement disorder) Dysfunctions associated with sleep stages or arousal from sleep  RECOMMENDATIONS: 1. This study demonstrates overall mild obstructive sleep apnea, severe in REM sleep with a total AHI of 13.5/hour, REM AHI of 40.5/hour, supine AHI of 22.8/hour and O2 nadir of 80%. Given the patient's medical history and sleep related complaints, treatment with positive airway pressure is recommended; this can be achieved in the form of autoPAP. Alternatively, a full-night CPAP titration study would allow optimization of therapy if needed. Other treatment options may include avoidance of supine sleep position along with weight loss, or the use of an oral appliance in selected patients. Please note, that untreated obstructive sleep apnea may carry additional perioperative morbidity. Patients with significant obstructive  sleep apnea should receive perioperative PAP therapy and the surgeons and particularly the anesthesiologist should be informed of the diagnosis and the severity of the sleep disordered breathing. 2. Moderate PLMs (periodic limb movements of sleep) were noted during this study with no significant arousals; clinical correlation is recommended. 3. This study shows significant sleep fragmentation and abnormal sleep stage percentages; these are nonspecific findings and per se do not signify an intrinsic sleep disorder or a cause for the patient's sleep-related symptoms. Causes include (but are not limited to) the first night effect of the sleep study, circadian rhythm disturbances, medication effect or an underlying mood disorder or medical problem.  4. The patient should be cautioned not to drive, work at heights, or operate dangerous or heavy equipment when tired or sleepy. Review and reiteration of good sleep hygiene measures should be pursued with any patient. 5. The patient will be seen in follow-up by Dr. Rexene Alberts at [facility] for discussion of the test results and further management strategies. The referring provider will be notified of the test results. I certify that I have reviewed the raw data recording prior to the issuance of this report in accordance with the standards of the American Academy of Sleep Medicine (AASM). Star Age, MD, PhD Diplomat, ABPN (Neurology and Sleep)  Technical report:   General Information  Name: Rosenda, Geffrard BMI: 32.10 Physician: Star Age, MD   ID: [id number] Height: 64.0 in Technician: Forde Radon  Sex: Female Weight: 187.0 lb Record: [id number]  Age: 19 [Mar 30, 1951] Date: 02/14/2022 Scorer: Forde Radon, RPSGT  Medical & Medication History    Ms. Tones is a 71 year old right-handed woman with an underlying medical history of stroke, recent TIA, diverticulosis, lower GI bleed, macular degeneration, reflux disease, hyperlipidemia, breast  cancer with status post mastectomy, and mildly overweight state, who was previously diagnosed with obstructive sleep apnea and placed on PAP therapy. Prior sleep study results are not available for my review today. Testing was through [facility] sleep medicine in 2016 or 2017 and she had a machine since then. She reports that she quit using her CPAP after summer 2020 as she had to leave town to be with her son and his family to help them out as he had COVID and was in quarantine. When she came back from [place] she had a hard time getting back on track with her CPAP machine. I was able to review her download from 10/23/2018 through 01/20/2019, during which time she used her machine fairly consistently for 19 days straight but no usage after 11/03/2018. Her DME company was [employer]. She has no family history of sleep apnea. She has been encouraged to get back on CPAP particularly by her cardiologist. She reports an Epworth sleepiness score of 3 out of 24, fatigue severity score of 61 out of 63. She has had weight gain over time, particularly after COVID started, she gained about 30 pounds altogether. She is trying to lose weight. She retired earlier this month. 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 She has a follow-up with GI, she has a history of GI bleed earlier this year. Bedtime is generally between 10 and 10:30 PM and rise time generally between 6:30 AM and 7:30 AM. She has nocturia about twice per average night and denies recurrent morning headaches. She feels that CPAP did help her when she first got on the machine, her pressure was averaging 11 cm on the latest download, pressure range of 4-16, she was on AutoPap, ResMed AirSense 10 AutoSet machine. She drinks caffeine occasionally, has reduced her soda intake. She drinks alcohol rarely, she is a non-smoker. Ventolin, Aspirin, Lipitor, Vitamin D, Nexium, Ferrous Sulfate,Flonase, Folvite, Claritin,  Miralax   Sleep Disorder      Comments   Patient arrived at the sleep lab for a PSG study. 4% desat rule used. Patient used to be on CPAP, but has not used it since 2020. Mild to moderate snoring. PLMs noted. 1  bathroom break.  Patient stated she had difficulty getting comfortable, which lead to having trouble sleeping after the bathroom break.    Lights out: 08:41:09 PM Lights on: 05:11:18 AM   Time Total Supine Side Prone Upright  Recording (TRT) 8h 30.71m2h 23.52mh 6.11m8154m 0.154m 66m0.154m  78mep (TST) 4h 4.11m 1h59m.154m 2h 22m11m 0h 0311m 0h 0.2154m  Late76m N1 N2 N3 REM Onset Per. Slp. Eff.  Actual 0h 0.154m 0h 1.11m111m 0.154m 91m56.11m 711m15.11m 0511m4.11m 4789m%   Stg66mr Wake N1 N2 N3 REM  Total 265.5 10.0 156.0 40.0 38.5  Supine 64.5 3.0 53.5 0.5 22.0  Side 201.0 7.0 102.5 39.5 16.5  Prone 0.0 0.0 0.0 0.0 0.0  Upright 0.0 0.0 0.0 0.0  0.0   Stg % Wake N1 N2 N3 REM  Total 52.1 4.1 63.8 16.4 15.7  Supine 12.6 1.2 21.9 0.2 9.0  Side 39.4 2.9 41.9 16.2 6.7  Prone 0.0 0.0 0.0 0.0 0.0  Upright 0.0 0.0 0.0 0.0 0.0     Apnea Summary Sub Supine Side Prone Upright  Total 9 Total '9 8 1 '$ 0 0    REM 0 0 0 0 0    NREM '9 8 1 '$ 0 0  Obs 9 REM 0 0 0 0 0    NREM '9 8 1 '$ 0 0  Mix 0 REM 0 0 0 0 0    NREM 0 0 0 0 0  Cen 0 REM 0 0 0 0 0    NREM 0 0 0 0 0   Rera Summary Sub Supine Side Prone Upright  Total 0 Total 0 0 0 0 0    REM 0 0 0 0 0    NREM 0 0 0 0 0   Hypopnea Summary Sub Supine Side Prone Upright  Total 47 Total 47 23 24 0 0    REM '27 17 10 '$ 0 0    NREM '20 6 14 '$ 0 0   4% Hypopnea Summary Sub Supine Side Prone Upright  Total (4%) 46 Total 46 22 24 0 0    REM '26 16 10 '$ 0 0    NREM '20 6 14 '$ 0 0     AHI Total Obs Mix Cen  13.74 Apnea 2.21 2.21 0.00 0.00   Hypopnea 11.53 -- -- --  13.50 Hypopnea (4%) 11.29 -- -- --    Total Supine Side Prone Upright  Position AHI 13.74 23.54 9.06 0.00 0.00  REM AHI 42.08   NREM AHI 8.45   Position RDI 13.74 23.54 9.06 0.00 0.00  REM RDI 42.08   NREM RDI  8.45    4% Hypopnea Total Supine Side Prone Upright  Position AHI (4%) 13.50 22.78 9.06 0.00 0.00  REM AHI (4%) 40.52   NREM AHI (4%) 8.45   Position RDI (4%) 13.50 22.78 9.06 0.00 0.00  REM RDI (4%) 40.52   NREM RDI (4%) 8.45    Desaturation Information Threshold: 2% <100% <90% <80% <70% <60% <50% <40%  Supine 86.0 25.0 0.0 0.0 0.0 0.0 0.0  Side 221.0 61.0 0.0 0.0 0.0 0.0 0.0  Prone 0.0 0.0 0.0 0.0 0.0 0.0 0.0  Upright 0.0 0.0 0.0 0.0 0.0 0.0 0.0  Total 307.0 86.0 0.0 0.0 0.0 0.0 0.0  Index 37.2 10.4 0.0 0.0 0.0 0.0 0.0   Threshold: 3% <100% <90% <80% <70% <60% <50% <40%  Supine 41.0 19.0 0.0 0.0 0.0 0.0 0.0  Side 100.0 40.0 0.0 0.0 0.0 0.0 0.0  Prone 0.0 0.0 0.0 0.0 0.0 0.0 0.0  Upright 0.0 0.0 0.0 0.0 0.0 0.0 0.0  Total 141.0 59.0 0.0 0.0 0.0 0.0 0.0  Index 17.1 7.2 0.0 0.0 0.0 0.0 0.0   Threshold: 4% <100% <90% <80% <70% <60% <50% <40%  Supine 33.0 18.0 0.0 0.0 0.0 0.0 0.0  Side 56.0 34.0 0.0 0.0 0.0 0.0 0.0  Prone 0.0 0.0 0.0 0.0 0.0 0.0 0.0  Upright 0.0 0.0 0.0 0.0 0.0 0.0 0.0  Total 89.0 52.0 0.0 0.0 0.0 0.0 0.0  Index 10.8 6.3 0.0 0.0 0.0 0.0 0.0   Threshold: 3% <100% <90% <80% <70% <60% <50% <40%  Supine 41 19 0 0 0 0 0  Side 100 40 0 0 0 0 0  Prone 0 0 0 0 0 0 0  Upright 0 0 0 0 0 0 0  Total 141 59 0 0 0 0 0   Awakening/Arousal Information # of Awakenings 25  Wake after sleep onset 250.63m Wake after persistent sleep 232.043m Arousal Assoc. Arousals Index  Apneas 4 1.0  Hypopneas 6 1.5  Leg Movements 6 1.5  Snore 0 0.0  PTT Arousals 0 0.0  Spontaneous 25 6.1  Total 41 10.1  Leg Movement Information PLMS LMs Index  Total LMs during PLMS 149 36.6  LMs w/ Microarousals 5 1.2   LM LMs Index  w/ Microarousal 1 0.2  w/ Awakening 2 0.5  w/ Resp Event 1 0.2  Spontaneous 25 6.1  Total 27 6.6     Desaturation threshold setting: 3% Minimum desaturation setting: 10 seconds SaO2 nadir: 80% The longest event was a 40 sec obstructive Hypopnea with a  minimum SaO2 of 87%. The lowest SaO2 was 80% associated with a 23 sec obstructive Hypopnea. EKG Rates EKG Avg Max Min  Awake 80 106 62  Asleep 77 93 59  EKG Events: Tachycardia

## 2022-02-27 ENCOUNTER — Telehealth: Payer: Self-pay | Admitting: *Deleted

## 2022-02-27 ENCOUNTER — Encounter: Payer: Self-pay | Admitting: *Deleted

## 2022-02-27 NOTE — Telephone Encounter (Signed)
I called [name] [name]. I advised [name] [name] that Dr. Rexene Alberts reviewed their sleep study results and found that [name] [name] has mild osa. Dr. Rexene Alberts recommends that [name] [name] start autopap to see if she would feel better. I reviewed PAP compliance expectations with the [name] [name]. [name] [name] is agreeable to starting an auto-PAP. I advised [name] [name] that an order will be sent to a DME, [facility], and they will call the [name] [name] within about one week after they file with the [name] [name]'s insurance. [facility] will show the [name] [name] how to use the machine, fit for masks, and troubleshoot the auto-PAP if needed. A follow up appt was made for insurance purposes with Frann Rider on 05/04/2022 at 11:15am. [name] [name] verbalized understanding to arrive 15 minutes early and bring their auto-PAP. A letter with all of this information in it will be mailed to the [name] [name] as a reminder. I verified with the [name] [name] that the address we have on file is correct. [name] [name] verbalized understanding of results. [name] [name] had no questions at this time but was encouraged to call back if questions arise. I have sent the order to [employer] and have received confirmation that they have received the order.

## 2022-02-27 NOTE — Telephone Encounter (Signed)
-----   Message from Star Age, MD sent at 02/21/2022  7:12 PM EDT ----- Patient referred by Frann Rider, NP, seen by me on 01/10/2022, diagnostic PSG on 02/14/2022.    Please call and notify the patient that the recent sleep study did confirm the diagnosis of obstructive sleep apnea. OSA is overall mild, but worth treating to see if she feels better after treatment. To that end I recommend treatment for this in the form of autoPAP, which means, that we don't have to bring her back for a second sleep study with CPAP, but will let him try an autoPAP machine at home, through a DME company (of her choice, or as per insurance requirement). The DME representative will educate her on how to use the machine, how to put the mask on, etc. I have placed an order in the chart. Please send referral, talk to patient, send report to referring MD. We will need a FU in sleep clinic for 10 weeks post-PAP set up, please arrange that with me or one of our NPs.  Please note that patient had a previous sleep apnea diagnosis and had a machine from before.  She may be eligible for new machine and can use her previous DME company or new company if she desires.  If she is not quite eligible for new machine, we will have to use her previous machine.  Previous sleep study was in 2016 or 17, I am assuming she is eligible per insurance for a new set of equipment.  Thanks,     Star Age, MD, PhD [facility])

## 2022-02-28 NOTE — Telephone Encounter (Signed)
New, [name] Rosenthal, RN; [name] [name], [name] [name] Received, Thank you!      Previous Messages    ----- Message -----  From: [name] Melnick, RN  Sent: 02/27/2022   4:56 PM EDT  To: [name] [name]; [name] [name]; [name] [name]; *  Subject: new autopap (former user 01-16-2017)             New order for machine/ autopap   [name] [name]  Female, 71 y.o., Jun 14, 1950  MRN:  [id number]  Phone:  [phone]    [facility]

## 2022-03-01 ENCOUNTER — Encounter (HOSPITAL_COMMUNITY)
Admission: RE | Disposition: A | Discharge status: Home / Self Care | Payer: Self-pay | Source: Home / Self Care | Attending: Cardiology

## 2022-03-01 ENCOUNTER — Ambulatory Visit (HOSPITAL_COMMUNITY)
Admission: RE | Admit: 2022-03-01 | Discharge: 2022-03-01 | Disposition: A | Discharge status: Home / Self Care | Payer: Medicare PPO | Source: Home / Self Care | Attending: Cardiology | Admitting: Cardiology

## 2022-03-01 ENCOUNTER — Ambulatory Visit (HOSPITAL_COMMUNITY): Payer: Medicare PPO | Admitting: Anesthesiology

## 2022-03-01 ENCOUNTER — Ambulatory Visit (HOSPITAL_BASED_OUTPATIENT_CLINIC_OR_DEPARTMENT_OTHER): Payer: Medicare PPO | Admitting: Anesthesiology

## 2022-03-01 ENCOUNTER — Encounter (HOSPITAL_COMMUNITY): Payer: Self-pay | Admitting: Cardiology

## 2022-03-01 ENCOUNTER — Ambulatory Visit (HOSPITAL_COMMUNITY)
Admission: RE | Admit: 2022-03-01 | Discharge: 2022-03-01 | Disposition: A | Discharge status: Home / Self Care | Payer: Medicare PPO | Attending: Cardiology | Admitting: Cardiology

## 2022-03-01 ENCOUNTER — Other Ambulatory Visit: Payer: Self-pay

## 2022-03-01 DIAGNOSIS — E785 Hyperlipidemia, unspecified: Secondary | ICD-10-CM | POA: Insufficient documentation

## 2022-03-01 DIAGNOSIS — G459 Transient cerebral ischemic attack, unspecified: Secondary | ICD-10-CM

## 2022-03-01 DIAGNOSIS — Z853 Personal history of malignant neoplasm of breast: Secondary | ICD-10-CM | POA: Insufficient documentation

## 2022-03-01 DIAGNOSIS — E669 Obesity, unspecified: Secondary | ICD-10-CM | POA: Insufficient documentation

## 2022-03-01 DIAGNOSIS — Z8673 Personal history of transient ischemic attack (TIA), and cerebral infarction without residual deficits: Secondary | ICD-10-CM

## 2022-03-01 DIAGNOSIS — Z6831 Body mass index (BMI) 31.0-31.9, adult: Secondary | ICD-10-CM | POA: Diagnosis not present

## 2022-03-01 DIAGNOSIS — I700 Atherosclerosis of aorta: Secondary | ICD-10-CM | POA: Diagnosis not present

## 2022-03-01 DIAGNOSIS — I639 Cerebral infarction, unspecified: Secondary | ICD-10-CM | POA: Diagnosis not present

## 2022-03-01 DIAGNOSIS — G4733 Obstructive sleep apnea (adult) (pediatric): Secondary | ICD-10-CM | POA: Diagnosis not present

## 2022-03-01 DIAGNOSIS — K449 Diaphragmatic hernia without obstruction or gangrene: Secondary | ICD-10-CM | POA: Diagnosis not present

## 2022-03-01 DIAGNOSIS — K219 Gastro-esophageal reflux disease without esophagitis: Secondary | ICD-10-CM | POA: Insufficient documentation

## 2022-03-01 DIAGNOSIS — I071 Rheumatic tricuspid insufficiency: Secondary | ICD-10-CM | POA: Diagnosis not present

## 2022-03-01 DIAGNOSIS — D649 Anemia, unspecified: Secondary | ICD-10-CM

## 2022-03-01 HISTORY — PX: BUBBLE STUDY: SHX6837

## 2022-03-01 HISTORY — PX: TEE WITHOUT CARDIOVERSION: SHX5443

## 2022-03-01 LAB — POCT I-STAT, CHEM 8
BUN: 11 mg/dL (ref 8–23)
Calcium, Ion: 1.17 mmol/L (ref 1.15–1.40)
Chloride: 105 mmol/L (ref 98–111)
Creatinine, Ser: 0.6 mg/dL (ref 0.44–1.00)
Glucose, Bld: 91 mg/dL (ref 70–99)
HCT: 41 % (ref 36.0–46.0)
Hemoglobin: 13.9 g/dL (ref 12.0–15.0)
Potassium: 4.2 mmol/L (ref 3.5–5.1)
Sodium: 142 mmol/L (ref 135–145)
TCO2: 26 mmol/L (ref 22–32)

## 2022-03-01 SURGERY — ECHOCARDIOGRAM, TRANSESOPHAGEAL
Anesthesia: Monitor Anesthesia Care

## 2022-03-01 MED ORDER — LIDOCAINE 2% (20 MG/ML) 5 ML SYRINGE
INTRAMUSCULAR | Status: DC | PRN
  Administered 2022-03-01: 40 mg via INTRAVENOUS

## 2022-03-01 MED ORDER — SODIUM CHLORIDE 0.9 % IV SOLN: INTRAVENOUS | Status: DC

## 2022-03-01 MED ORDER — PROPOFOL 500 MG/50ML IV EMUL
INTRAVENOUS | Status: DC | PRN
  Administered 2022-03-01: 150 ug/kg/min via INTRAVENOUS
  Administered 2022-03-01: 30 mg via INTRAVENOUS
  Administered 2022-03-01: 20 mg via INTRAVENOUS

## 2022-03-01 NOTE — Anesthesia Postprocedure Evaluation (Signed)
Anesthesia Post Note  Patient: [name] [name]  Procedure(s) Performed: TRANSESOPHAGEAL ECHOCARDIOGRAM (TEE) BUBBLE STUDY     Patient location during evaluation: Endoscopy Anesthesia Type: MAC Level of consciousness: awake and alert Pain management: pain level controlled Vital Signs Assessment: post-procedure vital signs reviewed and stable Respiratory status: spontaneous breathing, nonlabored ventilation and respiratory function stable Cardiovascular status: stable and blood pressure returned to baseline Postop Assessment: no apparent nausea or vomiting Anesthetic complications: no  No notable events documented.  Last Vitals:  Vitals:   03/01/22 1315 03/01/22 1330  BP: 116/74 139/77  Pulse: 94 75  Resp: 17 19  Temp: 36.7 C 36.7 C  SpO2: 96% 99%    Last Pain:  Vitals:   03/01/22 1315  TempSrc:   PainSc: 0-No pain                 Alessander Sikorski,W. EDMOND

## 2022-03-01 NOTE — Anesthesia Preprocedure Evaluation (Addendum)
Anesthesia Evaluation  Patient identified by MRN, date of birth, ID band Patient awake    Reviewed: Allergy & Precautions, H&P , NPO status , Patient's Chart, lab work & pertinent test results  History of Anesthesia Complications (+) PONV and history of anesthetic complications  Airway Mallampati: III  TM Distance: >3 FB Neck ROM: Full    Dental no notable dental hx. (+) Teeth Intact, Dental Advisory Given   Pulmonary neg pulmonary ROS   Pulmonary exam normal breath sounds clear to auscultation       Cardiovascular negative cardio ROS  Rhythm:Regular Rate:Normal     Neuro/Psych TIA negative psych ROS   GI/Hepatic Neg liver ROS,GERD  Medicated,,  Endo/Other  negative endocrine ROS    Renal/GU negative Renal ROS  negative genitourinary   Musculoskeletal   Abdominal   Peds  Hematology  (+) Blood dyscrasia, anemia   Anesthesia Other Findings   Reproductive/Obstetrics negative OB ROS                             Anesthesia Physical Anesthesia Plan  ASA: 2  Anesthesia Plan: MAC   Post-op Pain Management: Minimal or no pain anticipated   Induction: Intravenous  PONV Risk Score and Plan: 3 and Propofol infusion and Treatment may vary due to age or medical condition  Airway Management Planned: Natural Airway and Nasal Cannula  Additional Equipment:   Intra-op Plan:   Post-operative Plan:   Informed Consent: I have reviewed the patients History and Physical, chart, labs and discussed the procedure including the risks, benefits and alternatives for the proposed anesthesia with the patient or authorized representative who has indicated his/her understanding and acceptance.     Dental advisory given  Plan Discussed with: CRNA  Anesthesia Plan Comments:        Anesthesia Quick Evaluation

## 2022-03-01 NOTE — CV Procedure (Signed)
Transesophageal echocardiogram (TEE) : Preliminary report  Sedation: see anesthesia records.   TEE was performed without complications   LV: Normal EF. RV: Normal structure and function. LA: Grossly normal.  Spontaneous echo contrast was no present.  No thrombus present. Left atrial appendage: Spontaneous echo contrast was not  present.  No thrombus present. Positive bubble study.  RA: Grossly normal. .   MV: no regurgitation, no stenosis TV: trivial regurgitation,no stenosis AV: no regurgitation,no stenosis PV: no regurgitation,no stenosis   Thoracic and ascending aorta: Mild plaque.  No transgastric images performed due to history of LARGE hernia.   Final report forthcoming.  Rex Kras, [place], [facility]  Pager: [phone] Office: [phone]

## 2022-03-01 NOTE — H&P (Signed)
HISTORY AND PHYSICAL  Patient ID: [name] [name] MRN: [id number] DOB/AGE: [birth year] 71 y.o.  Admit date: 03/01/2022 Attending physician: Rex Kras, DO Primary Physician:  [name], MD  Chief complaint: elective TEE  HPI:  [name] [name] is a 71 y.o. female who presents with a chief complaint of "elective TEE." Her past medical history and cardiovascular risk factors include: Hyperlipidemia, obesity, obstructive sleep apnea, history of lower GI bleed, iron deficiency anemia, history of TIA and prior strokes, history of breast cancer status post right mastectomy, aortic atherosclerosis (CT scan 10/2021), a large hiatal hernia.  In June 2023 she was hospitalized for difficulty moving her eyes to focus and aphasia and was diagnosed with TIA.  Imaging studies revealed prior strokes and thereafter has been on higher dose of statin therapy and aspirin.  She was evaluated neurology who recommends 30-day event monitor and therefore referred to cardiology for further evaluation and management.   Here for elective TEE. No change in medical history since last office visit per patient.   ALLERGIES: No Known Allergies  PAST MEDICAL HISTORY: Past Medical History:  Diagnosis Date   Acute lower GI bleeding 10/25/2021   Aortic atherosclerosis ([place]) 10/25/2021   Cancer ([place])    Diverticulosis of intestine with bleeding 10/25/2021   GERD (gastroesophageal reflux disease) 10/25/2021   Hyperlipidemia 10/25/2021   Macular degeneration    PONV (postoperative nausea and vomiting)     PAST SURGICAL HISTORY: Past Surgical History:  Procedure Laterality Date   ABDOMINAL HYSTERECTOMY     BREAST EXCISIONAL BIOPSY Right    BREAST LUMPECTOMY WITH RADIOACTIVE SEED LOCALIZATION Right 01/18/2016   Procedure: RIGHT BREAST LUMPECTOMY WITH RADIOACTIVE SEED LOCALIZATION;  Surgeon: Erroll Luna, MD;  Location: [place];  Service: General;  Laterality: Right;   BREAST SURGERY     MASTECTOMY Left     TURBINATE REDUCTION      FAMILY HISTORY: The patient family history includes Dementia in her paternal grandmother; Stomach cancer in her father; Stroke in her paternal grandfather.   SOCIAL HISTORY:  The patient  reports that she has never smoked. She has never used smokeless tobacco. She reports that she does not drink alcohol and does not use drugs.  MEDICATIONS: Current Outpatient Medications  Medication Instructions   albuterol (VENTOLIN HFA) 108 (90 Base) MCG/ACT inhaler 1-2 puffs, Inhalation, Every 6 hours PRN   aspirin EC 81 mg, Oral, Daily, Swallow whole.   atorvastatin (LIPITOR) 40 mg, Oral, Daily at bedtime   esomeprazole (NEXIUM) 20 mg, Oral, Daily before breakfast   ferrous sulfate 325 mg, Oral, 3 times weekly   fluticasone (FLONASE) 50 MCG/ACT nasal spray 1-2 sprays, Each Nare, Daily PRN   folic acid (FOLVITE) 1 mg, Oral, Daily, Follow up your folate levels and anemia with your PCP.   loratadine (CLARITIN) 10 mg, Oral, Daily PRN   OVER THE COUNTER MEDICATION 1 capsule, Oral, Daily, Andrew Lessman's Ultimate Eye Support with Astaxanthin capsules- Take 1 capsule by mouth every morning   OVER THE COUNTER MEDICATION 1 capsule, Oral, Daily, Kelli Hope Essential-1 with Vitamin D3-2000 capsules- Take 1 capsule by mouth once a day after breakfast   polyethylene glycol (MIRALAX / GLYCOLAX) 17 g, Oral, Daily   Vitamin D (Ergocalciferol) (DRISDOL) 50,000 Units, Oral, 2 times weekly, Take 50,000 units by mouth every Wednesday and Saturday     sodium chloride      REVIEW OF SYSTEMS: ROS  PHYSICAL EXAM:    03/01/2022   11:31 AM 01/10/2022  10:24 AM 01/04/2022   10:38 AM  Vitals with BMI  Height 5' 4.5" '5\' 4"'$  '5\' 4"'$   Weight 187 lbs 187 lbs 10 oz 189 lbs 6 oz  BMI 31.61 61.60 73.71  Systolic 062 694 854  Diastolic 97 77 84  Pulse  81 91    No intake or output data in the 24 hours ending 03/01/22 1230  Net IO Since Admission: No IO data has been entered for this period  [03/01/22 1230] CONSTITUTIONAL: Well-developed and well-nourished. No acute distress.  SKIN: Skin is warm and dry. No rash noted. No cyanosis. No pallor. No jaundice HEAD: Normocephalic and atraumatic.  EYES: No scleral icterus MOUTH/THROAT: Moist oral membranes.  NECK: No JVD present. No thyromegaly noted. No carotid bruits  LYMPHATIC: No visible cervical adenopathy.  CHEST Normal respiratory effort. No intercostal retractions  LUNGS: Clear to auscultation bilaterally. No stridor. No wheezes. No rales.  CARDIOVASCULAR: Regular rate and rhythm, positive S1-S2, no murmurs rubs or gallops appreciated. ABDOMINAL: No apparent ascites.  EXTREMITIES: No peripheral edema  HEMATOLOGIC: No significant bruising NEUROLOGIC: Oriented to person, place, and time. Nonfocal. Normal muscle tone.  PSYCHIATRIC: Normal mood and affect. Normal behavior. Cooperative  RADIOLOGY: No results found.  LABORATORY DATA: Lab Results  Component Value Date   WBC 6.0 11/16/2021   HGB 9.1 (L) 11/16/2021   HCT 28.3 (L) 11/16/2021   MCV 86.8 11/16/2021   PLT 457 (H) 11/16/2021   No results for input(s): "NA", "K", "CL", "CO2", "BUN", "CREATININE", "CALCIUM", "PROT", "BILITOT", "ALKPHOS", "ALT", "AST", "GLUCOSE" in the last 168 hours.  Invalid input(s): "LABALBU"  Lipid Panel  Lab Results  Component Value Date   CHOL 158 11/15/2021   HDL 49 11/15/2021   LDLCALC 80 11/15/2021   TRIG 143 11/15/2021   CHOLHDL 3.2 11/15/2021    BNP (last 3 results) No results for input(s): "BNP" in the last 8760 hours.  HEMOGLOBIN A1C Lab Results  Component Value Date   HGBA1C 5.4 11/15/2021   MPG 108 11/15/2021    Cardiac Panel (last 3 results) Recent Labs    11/15/21 2017  CKTOTAL 42    Lab Results  Component Value Date   CKTOTAL 42 11/15/2021     TSH Recent Labs    11/15/21 1130  TSH 1.110      RADIOLOGY: CT angio GI bleed protocol: 10/25/2021: 1. No evidence of acute abnormalities within the  abdomen or pelvis. 2. Diffuse colonic diverticulosis without evidence of acute diverticulitis. 3. Large hiatal hernia. Aortic Atherosclerosis (ICD10-I70.0).   CT of the head without contrast: 11/15/2021 1. No evidence of acute intracranial abnormality. 2. Small remote appearing right cerebellar lacunar infarct. 3. Moderate paranasal sinus mucosal thickening.   MRI brain without contrast: 11/15/2021 1. Subtle 7 mm acute infarct versus artifact within the posterior right frontal lobe subcortical white matter. 2. Background mild cerebral white matter chronic small vessel ischemic disease. 3. Chronic lacunar infarcts within the posterior right lentiform nucleus and medial left thalamus. 4. Subcentimeter chronic infarcts within the bilateral cerebellar hemispheres. 5. Paranasal sinus disease, as described.   CARDIAC DATABASE: EKG: 01/04/2022: Sinus rhythm, 77bpm, without underlying ischemia or injury pattern.   Echocardiogram: 11/16/2021:  1. Left ventricular ejection fraction, by estimation, is 60 to 65%. The left ventricle has normal function. The left ventricle has no regional wall motion abnormalities. Left ventricular diastolic parameters are consistent with Grade I diastolic dysfunction (impaired relaxation).   2. Right ventricular systolic function is normal. The right ventricular size is normal.  3. The mitral valve is normal in structure. No evidence of mitral valve regurgitation. No evidence of mitral stenosis.   4. The aortic valve is normal in structure. Aortic valve regurgitation is not visualized. No aortic stenosis is present.   5. The IVC is normal in size with greater than 50% respiratory variability, suggesting right atrial pressure of 3 mmHg.  Conclusion(s)/Recommendation(s): No intracardiac source of embolism detected on this transthoracic study. Consider a transesophageal echocardiogram to exclude cardiac source of embolism if clinically indicated.    Stress Testing: No  results found for this or any previous visit from the past 1095 days.     Heart Catheterization: None  IMPRESSION & RECOMMENDATIONS: [name] [name] is a 71 y.o. Caucasian female whose past medical history and cardiovascular risk factors include: Hyperlipidemia, obesity, obstructive sleep apnea, history of lower GI bleed, iron deficiency anemia, history of TIA and prior strokes, history of breast cancer status post right mastectomy, aortic atherosclerosis (CT scan 10/2021), a large hiatal hernia..  Impression:  TIA Hx of stroke HLD Hx of GI Bleeding IDA Hx of breast cancer   Plan:  Given the hx of TIA and stroke here for elective TEE.   After careful review of history and examination, the risks, benefits of transesophageal echocardiogram, and alternatives have been explained to the patient. Complications include but not limited to esophageal perforation (rare), gastrointestinal bleeding (rare), cardiac arrhythmia which can include cardiac arrest and death (rare), pharyngeal irritation / discomfort with swallowing / hematoma, methemoglobinemia, bronchospasm, transient hypoxia, nonsustained ventricular tachycardia, transient atrial fibrillation, minimal hemoptysis, vomiting, hypotension, respiratory compromise, reaction to medications, unavoidable damage to teeth and gums, aspiration pneumonia  were reviewed with the patient.  Patient voices understands, provides verbal feedback, questions answered, and patient wishes to proceed with the procedure.  Here with her friend Ms. Meliton Rattan and he give verbal consent to discuss TEE findings and d/c planning.   Patient's questions and concerns were addressed to her satisfaction. She voices understanding of the instructions provided during this encounter.   This note was created using a voice recognition software as a result there may be grammatical errors inadvertently enclosed that do not reflect the nature of this encounter. Every attempt is  made to correct such errors.  Mechele Claude [facility]  Pager: [phone] Office: [phone] 03/01/2022, 12:30 PM

## 2022-03-01 NOTE — Transfer of Care (Signed)
Immediate Anesthesia Transfer of Care Note  Patient: [name] [name]  Procedure(s) Performed: TRANSESOPHAGEAL ECHOCARDIOGRAM (TEE) BUBBLE STUDY  Patient Location: PACU  Anesthesia Type:MAC  Level of Consciousness: awake, alert , and oriented  Airway & Oxygen Therapy: Patient Spontanous Breathing  Post-op Assessment: Report given to RN  Post vital signs: Reviewed and stable  Last Vitals:  Vitals Value Taken Time  BP 116/74 03/01/22 1315  Temp    Pulse 86 03/01/22 1319  Resp 9 03/01/22 1319  SpO2 96 % 03/01/22 1319  Vitals shown include unvalidated device data.  Last Pain:  Vitals:   03/01/22 1131  TempSrc: Tympanic  PainSc: 0-No pain         Complications: No notable events documented.

## 2022-03-01 NOTE — Anesthesia Procedure Notes (Signed)
Procedure Name: MAC Date/Time: 03/01/2022 12:51 PM  Performed by: Barrington Ellison, CRNAPre-anesthesia Checklist: Patient identified, Emergency Drugs available, Suction available, Patient being monitored and Timeout performed Patient Re-evaluated:Patient Re-evaluated prior to induction Oxygen Delivery Method: Nasal cannula

## 2022-03-01 NOTE — Progress Notes (Signed)
  Echocardiogram Echocardiogram Transesophageal has been performed.  Johny Chess 03/01/2022, 1:19 PM

## 2022-03-04 ENCOUNTER — Encounter (HOSPITAL_COMMUNITY): Payer: Self-pay | Admitting: Cardiology

## 2022-03-14 ENCOUNTER — Encounter: Payer: Self-pay | Admitting: Cardiology

## 2022-03-14 ENCOUNTER — Ambulatory Visit: Payer: Medicare PPO | Admitting: Cardiology

## 2022-03-14 VITALS — BP 135/77 | HR 82 | Temp 97.1°F | Resp 16 | Ht 64.0 in | Wt 188.4 lb

## 2022-03-14 DIAGNOSIS — Z8719 Personal history of other diseases of the digestive system: Secondary | ICD-10-CM

## 2022-03-14 DIAGNOSIS — Z8673 Personal history of transient ischemic attack (TIA), and cerebral infarction without residual deficits: Secondary | ICD-10-CM

## 2022-03-14 DIAGNOSIS — E782 Mixed hyperlipidemia: Secondary | ICD-10-CM | POA: Diagnosis not present

## 2022-03-14 DIAGNOSIS — G459 Transient cerebral ischemic attack, unspecified: Secondary | ICD-10-CM | POA: Diagnosis not present

## 2022-03-14 DIAGNOSIS — Q2112 Patent foramen ovale: Secondary | ICD-10-CM

## 2022-03-14 DIAGNOSIS — Z853 Personal history of malignant neoplasm of breast: Secondary | ICD-10-CM

## 2022-03-14 DIAGNOSIS — I700 Atherosclerosis of aorta: Secondary | ICD-10-CM

## 2022-03-14 NOTE — H&P (View-Only) (Signed)
ID:  [name] [name], DOB 1950/08/01, MRN [id number]  PCP:  [name] Lass, MD  Cardiologist:  [name] Kras, DO, [facility] (established care 01/04/2022)  Date: 03/14/22 Last Office Visit: 01/04/2022  Chief Complaint  Patient presents with   Transient Ischemic Attack   Hospitalization Follow-up    HPI  [name] [name] is a 71 y.o. Caucasian female whose  past medical history and cardiovascular risk factors include: Hyperlipidemia, obesity, obstructive sleep apnea, history of lower GI bleed, iron deficiency anemia, history of TIA and prior strokes, history of breast cancer status post right mastectomy, aortic atherosclerosis (CT scan 10/2021), a large hiatal hernia.  In June 2023 she was hospitalized due to difficulty moving her eyes, aphasia.  She was diagnosed with transient ischemic attack.  Follow-up imaging revealed prior strokes.  She was referred to cardiology to rule out cardiac embolic etiology.  In the interim, her statin dose was increased and she was started on aspirin 81 mg p.o. daily.  Since last office visit she has completed the cardiac monitor, MPI, and transesophageal echocardiogram.  Results reviewed with her in great detail and noted below for further reference.  Of note, she has not had any recurrent episodes of TIA/stroke.  Clinically she is improving since her last event in June 2023.  Patient is informed that she does have a PFO on her recent transesophageal echocardiogram images reviewed and pathophysiology discussed.  ALLERGIES: No Known Allergies  MEDICATION LIST PRIOR TO VISIT: Current Meds  Medication Sig   albuterol (VENTOLIN HFA) 108 (90 Base) MCG/ACT inhaler Inhale 1-2 puffs into the lungs every 6 (six) hours as needed for wheezing or shortness of breath.   aspirin EC 81 MG tablet Take 1 tablet (81 mg total) by mouth daily. Swallow whole.   atorvastatin (LIPITOR) 40 MG tablet Take 1 tablet (40 mg total) by mouth at bedtime.   esomeprazole (NEXIUM) 20 MG capsule  Take 20 mg by mouth daily before breakfast.   fluticasone (FLONASE) 50 MCG/ACT nasal spray Place 1-2 sprays into both nostrils daily as needed (for seasonal allergies).   folic acid (FOLVITE) 1 MG tablet Take 1 tablet (1 mg total) by mouth daily. Follow up your folate levels and anemia with your PCP.   loratadine (CLARITIN) 10 MG tablet Take 10 mg by mouth daily as needed (for seasonal allergies).   OVER THE COUNTER MEDICATION Take 1 capsule by mouth daily. [place] with Astaxanthin capsules- Take 1 capsule by mouth every morning   OVER THE COUNTER MEDICATION Take 1 capsule by mouth daily. Kelli Hope Essential-1 with Vitamin D3-2000 capsules- Take 1 capsule by mouth once a day after breakfast   polyethylene glycol (MIRALAX / GLYCOLAX) 17 g packet Take 17 g by mouth daily. (Patient taking differently: Take 17 g by mouth as needed for moderate constipation (with iron supplement).)   Vitamin D, Ergocalciferol, (DRISDOL) 1.25 MG (50000 UNIT) CAPS capsule Take 50,000 Units by mouth 2 (two) times a week. Take 50,000 units by mouth every Wednesday and Saturday     PAST MEDICAL HISTORY: Past Medical History:  Diagnosis Date   Acute lower GI bleeding 10/25/2021   Aortic atherosclerosis ([place]) 10/25/2021   Cancer ([place])    Diverticulosis of intestine with bleeding 10/25/2021   GERD (gastroesophageal reflux disease) 10/25/2021   Hyperlipidemia 10/25/2021   Macular degeneration    PONV (postoperative nausea and vomiting)     PAST SURGICAL HISTORY: Past Surgical History:  Procedure Laterality Date   ABDOMINAL HYSTERECTOMY  BREAST EXCISIONAL BIOPSY Right    BREAST LUMPECTOMY WITH RADIOACTIVE SEED LOCALIZATION Right 01/18/2016   Procedure: RIGHT BREAST LUMPECTOMY WITH RADIOACTIVE SEED LOCALIZATION;  Surgeon: Erroll Luna, MD;  Location: [place];  Service: General;  Laterality: Right;   BREAST SURGERY     BUBBLE STUDY  03/01/2022   Procedure: BUBBLE STUDY;   Surgeon: [name] Kras, DO;  Location: [place];  Service: Cardiovascular;;   MASTECTOMY Left    TEE WITHOUT CARDIOVERSION N/A 03/01/2022   Procedure: TRANSESOPHAGEAL ECHOCARDIOGRAM (TEE);  Surgeon: [name] Kras, DO;  Location: [facility];  Service: Cardiovascular;  Laterality: N/A;   TURBINATE REDUCTION      FAMILY HISTORY: The patient family history includes Dementia in her paternal grandmother; Stomach cancer in her father; Stroke in her paternal grandfather.  SOCIAL HISTORY:  The patient  reports that she has never smoked. She has never used smokeless tobacco. She reports that she does not drink alcohol and does not use drugs.  REVIEW OF SYSTEMS: Review of Systems  Cardiovascular:  Positive for dyspnea on exertion (chronic and stable). Negative for chest pain, claudication, irregular heartbeat, leg swelling, near-syncope, orthopnea, palpitations, paroxysmal nocturnal dyspnea and syncope.  Respiratory:  Negative for shortness of breath.   Hematologic/Lymphatic: Negative for bleeding problem.  Musculoskeletal:  Negative for muscle cramps and myalgias.  Neurological:  Negative for dizziness and light-headedness.    PHYSICAL EXAM:    03/14/2022   10:37 AM 03/01/2022    1:30 PM 03/01/2022    1:15 PM  Vitals with BMI  Height '5\' 4"'$     Weight 188 lbs 6 oz    BMI 43.15    Systolic 400 867 619  Diastolic 77 77 74  Pulse 82 75 94   Physical Exam  Constitutional: No distress.  Age appropriate, hemodynamically stable.   Neck: No JVD present.  Cardiovascular: Normal rate, regular rhythm, S1 normal, S2 normal, intact distal pulses and normal pulses. Exam reveals no gallop, no S3 and no S4.  No murmur heard. Pulses:      Dorsalis pedis pulses are 2+ on the right side and 2+ on the left side.       Posterior tibial pulses are 2+ on the right side and 2+ on the left side.  Pulmonary/Chest: Effort normal. No stridor. She has no wheezes. She has no rales.  Abdominal: Soft. Bowel  sounds are normal. She exhibits no distension. There is no abdominal tenderness.  Musculoskeletal:        General: No edema.     Cervical back: Neck supple.  Neurological: She is alert and oriented to person, place, and time. She has intact cranial nerves (2-12).  Skin: Skin is warm and moist.    RADIOLOGY: CT angio GI bleed protocol: 10/25/2021: 1. No evidence of acute abnormalities within the abdomen or pelvis. 2. Diffuse colonic diverticulosis without evidence of acute diverticulitis. 3. Large hiatal hernia. Aortic Atherosclerosis (ICD10-I70.0).  CT of the head without contrast: 11/15/2021 1. No evidence of acute intracranial abnormality. 2. Small remote appearing right cerebellar lacunar infarct. 3. Moderate paranasal sinus mucosal thickening.  MRI brain without contrast: 11/15/2021 1. Subtle 7 mm acute infarct versus artifact within the posterior right frontal lobe subcortical white matter. 2. Background mild cerebral white matter chronic small vessel ischemic disease. 3. Chronic lacunar infarcts within the posterior right lentiform nucleus and medial left thalamus. 4. Subcentimeter chronic infarcts within the bilateral cerebellar hemispheres. 5. Paranasal sinus disease, as described.  CARDIAC DATABASE: EKG: 01/04/2022: Sinus rhythm,  77bpm, without underlying ischemia or injury pattern.  Echocardiogram: 11/16/2021:  1. Left ventricular ejection fraction, by estimation, is 60 to 65%. The left ventricle has normal function. The left ventricle has no regional wall motion abnormalities. Left ventricular diastolic parameters are consistent with Grade I diastolic dysfunction (impaired relaxation).   2. Right ventricular systolic function is normal. The right ventricular size is normal.   3. The mitral valve is normal in structure. No evidence of mitral valve regurgitation. No evidence of mitral stenosis.   4. The aortic valve is normal in structure. Aortic valve regurgitation is not  visualized. No aortic stenosis is present.   5. The IVC is normal in size with greater than 50% respiratory variability, suggesting right atrial pressure of 3 mmHg.  Conclusion(s)/Recommendation(s): No intracardiac source of embolism detected on this transthoracic study. Consider a transesophageal echocardiogram to exclude cardiac source of embolism if clinically indicated.   TEE 03/01/2022:  1. Left ventricular ejection fraction, by estimation, is 60 to 65%. The left ventricle has normal function. The left ventricle has no regional wall motion abnormalities.   2. Right ventricular systolic function is normal. The right ventricular size is normal.   3. No left atrial/left atrial appendage thrombus was detected.   4. The mitral valve is normal in structure. No evidence of mitral valve regurgitation. No evidence of mitral stenosis.   5. The aortic valve is tricuspid. Aortic valve regurgitation is not visualized. No aortic stenosis is present.   6. Evidence of atrial level shunting detected by color flow Doppler. Agitated saline contrast bubble study was positive with shunting observed within 3-6 cardiac cycles suggestive of interatrial shunt.   Stress Testing: Exercise Tetrofosmin stress test 01/24/2022: Exercise nuclear stress test was performed using Bruce protocol. Patient reached 7 METS, and 90% of age predicted maximum heart rate. Exercise capacity was low. No chest pain reported. Heart rate and hemodynamic response were normal. Stress EKG revealed no ischemic changes. Normal myocardial perfusion. Stress LVEF 63%. Low risk study.  Heart Catheterization: None  Cardiac monitor (Zio Patch): January 04, 2022-January 18, 2022 Dominant rhythm sinus, followed by tachycardia (15%). Heart rate 50-193 bpm.  Avg HR 84 bpm. No atrial fibrillation, ventricular tachycardia, high grade AV block, pauses (3 seconds or longer). Total ventricular ectopic burden <1%. Total supraventricular ectopic burden  <1%. Episodes of paroxysmal supraventricular tachycardia. Patient triggered events: 0.   LABORATORY DATA:    Latest Ref Rng & Units 03/01/2022   12:42 PM 11/16/2021    2:07 PM 11/16/2021    2:52 AM  CBC  WBC 4.0 - 10.5 K/uL  6.0  7.0   Hemoglobin 12.0 - 15.0 g/dL 13.9  9.1  8.7   Hematocrit 36.0 - 46.0 % 41.0  28.3  27.5   Platelets 150 - 400 K/uL  457  448        Latest Ref Rng & Units 03/01/2022   12:42 PM 11/16/2021    2:54 AM 11/15/2021   11:30 AM  CMP  Glucose 70 - 99 mg/dL 91  105  119   BUN 8 - 23 mg/dL '11  9  10   '$ Creatinine 0.44 - 1.00 mg/dL 0.60  0.64  0.82   Sodium 135 - 145 mmol/L 142  139  137   Potassium 3.5 - 5.1 mmol/L 4.2  3.6  3.7   Chloride 98 - 111 mmol/L 105  107  107   CO2 22 - 32 mmol/L  23  22   Calcium 8.9 -  10.3 mg/dL  8.9  8.9   Total Protein 6.5 - 8.1 g/dL  6.2  6.6   Total Bilirubin 0.3 - 1.2 mg/dL  0.4  0.4   Alkaline Phos 38 - 126 U/L  78  86   AST 15 - 41 U/L  15  18   ALT 0 - 44 U/L  15  17     Lipid Panel     Component Value Date/Time   CHOL 158 11/15/2021 1130   TRIG 143 11/15/2021 1130   HDL 49 11/15/2021 1130   CHOLHDL 3.2 11/15/2021 1130   VLDL 29 11/15/2021 1130   LDLCALC 80 11/15/2021 1130    No components found for: "NTPROBNP" No results for input(s): "PROBNP" in the last 8760 hours. Recent Labs    11/15/21 1130  TSH 1.110    BMP Recent Labs    10/30/21 0418 11/15/21 1130 11/16/21 0254 03/01/22 1242  NA 141 137 139 142  K 3.7 3.7 3.6 4.2  CL 109 107 107 105  CO2 '26 22 23  '$ --   GLUCOSE 99 119* 105* 91  BUN '9 10 9 11  '$ CREATININE 0.68 0.82 0.64 0.60  CALCIUM 8.4* 8.9 8.9  --   GFRNONAA >60 >60 >60  --     HEMOGLOBIN A1C Lab Results  Component Value Date   HGBA1C 5.4 11/15/2021   MPG 108 11/15/2021    IMPRESSION:    ICD-10-CM   1. TIA (transient ischemic attack)  G45.9 EKG 12-Lead    2. History of stroke  Z86.73     3. PFO (patent foramen ovale)  Q21.12     4. Mixed hyperlipidemia  E78.2      5. Aortic atherosclerosis (HCC)  I70.0     6. History of GI bleed  Z87.19     7. HX: breast cancer  Z85.3        RECOMMENDATIONS: [name] [name] is a 71 y.o. Caucasian female whose past medical history and cardiac risk factors include: Hyperlipidemia, obesity, obstructive sleep apnea, history of lower GI bleed, iron deficiency anemia, history of TIA and prior strokes, patent foramen ovale, sleep apnea awaiting CPAP, history of breast cancer status post right mastectomy, aortic atherosclerosis (CT scan 10/2021), a large hiatal hernia.  TIA (transient ischemic attack) / History of stroke Index event in June 2023. She has had prior bilateral strokes Echo: Preserved LVEF, no intra-atrial shunting per color Doppler per report. TEE: Preserved EF, sonicated saline study and color Doppler positive for PFO, no significant valvular heart disease. Zio patch: No documented A-fib during the monitoring period.  Predominantly sinus with rare PSVT. MPI: Low risk stress test. Shared decision was to proceed with closure of the patent foramen ovale given her history of TIAs and stroke.  Risks, benefits, alternatives discussed by my partner Dr. Adrian Prows as part of today's office visit.  Patient is agreeable and would like to proceed forward. Continue aspirin and statin therapy. No acute dental issues that need to be addressed.  She goes in for a 65-monthvisit for her biannual checkup.  Patient is aware that she will need dental prophylaxis for the first 6 months after implantation of the septal occluder.  Mixed hyperlipidemia Currently on Lipitor.   She denies myalgia or other side effects. Most recent lipids dated June 2023, independently reviewed as noted above. Recommend a goal LDL <70 mg/dL. Currently managed by primary care provider.  Results of the MPI reviewed with her in great detail as well  as during today's office visit.  Overall low risk study and no additional work-up is warranted at this time  as she is asymptomatic.  Educated on the importance of improving her modifiable cardiovascular risk factors.  Since last office visit patient had a sleep study which is consistent with mild obstructive sleep apnea per patient.  She is awaiting delivery of her CPAP.  Total time spent: 53 minutes discussing and reviewing the images of the transesophageal echocardiogram, discussing the risks, benefits, alternatives to PFO closure with myself and my partner Dr. Einar Gip.  Reviewed the results of the echocardiogram, extended Holter, and stress test.  FINAL MEDICATION LIST END OF ENCOUNTER: No orders of the defined types were placed in this encounter.   There are no discontinued medications.   Current Outpatient Medications:    albuterol (VENTOLIN HFA) 108 (90 Base) MCG/ACT inhaler, Inhale 1-2 puffs into the lungs every 6 (six) hours as needed for wheezing or shortness of breath., Disp: , Rfl:    aspirin EC 81 MG tablet, Take 1 tablet (81 mg total) by mouth daily. Swallow whole., Disp: 30 tablet, Rfl: 11   atorvastatin (LIPITOR) 40 MG tablet, Take 1 tablet (40 mg total) by mouth at bedtime., Disp: 30 tablet, Rfl: 11   esomeprazole (NEXIUM) 20 MG capsule, Take 20 mg by mouth daily before breakfast., Disp: , Rfl:    fluticasone (FLONASE) 50 MCG/ACT nasal spray, Place 1-2 sprays into both nostrils daily as needed (for seasonal allergies)., Disp: , Rfl:    folic acid (FOLVITE) 1 MG tablet, Take 1 tablet (1 mg total) by mouth daily. Follow up your folate levels and anemia with your PCP., Disp: 30 tablet, Rfl: 3   loratadine (CLARITIN) 10 MG tablet, Take 10 mg by mouth daily as needed (for seasonal allergies)., Disp: , Rfl:    OVER THE COUNTER MEDICATION, Take 1 capsule by mouth daily. [place] with Astaxanthin capsules- Take 1 capsule by mouth every morning, Disp: , Rfl:    OVER THE COUNTER MEDICATION, Take 1 capsule by mouth daily. Kelli Hope Essential-1 with Vitamin D3-2000  capsules- Take 1 capsule by mouth once a day after breakfast, Disp: , Rfl:    polyethylene glycol (MIRALAX / GLYCOLAX) 17 g packet, Take 17 g by mouth daily. (Patient taking differently: Take 17 g by mouth as needed for moderate constipation (with iron supplement).), Disp: 14 each, Rfl: 0   Vitamin D, Ergocalciferol, (DRISDOL) 1.25 MG (50000 UNIT) CAPS capsule, Take 50,000 Units by mouth 2 (two) times a week. Take 50,000 units by mouth every Wednesday and Saturday, Disp: , Rfl:    ferrous sulfate 325 (65 FE) MG tablet, Take 1 tablet (325 mg total) by mouth 3 (three) times a week., Disp: 12 tablet, Rfl: 3  Orders Placed This Encounter  Procedures   EKG 12-Lead    There are no Patient Instructions on file for this visit.   --Continue cardiac medications as reconciled in final medication list. --Return in about 6 weeks (around 04/25/2022) for Follow up s/p PFO closure. or sooner if needed. --Continue follow-up with your primary care physician regarding the management of your other chronic comorbid conditions.  Patient's questions and concerns were addressed to her satisfaction. She voices understanding of the instructions provided during this encounter.   This note was created using a voice recognition software as a result there may be grammatical errors inadvertently enclosed that do not reflect the nature of this encounter. Every attempt is made to correct such  errors.  [name] [name], [place], [facility]  Pager: [phone] Office: [phone]

## 2022-03-14 NOTE — Progress Notes (Signed)
ID:  FLORAINE BUECHLER, DOB 1950-07-16, MRN [id number]  PCP:  [name], MD  Cardiologist:  Rex Kras, DO, [facility] (established care 01/04/2022)  Date: 03/14/22 Last Office Visit: 01/04/2022  Chief Complaint  Patient presents with   Transient Ischemic Attack   Hospitalization Follow-up    HPI  [name] [name] is a 71 y.o. Caucasian female whose  past medical history and cardiovascular risk factors include: Hyperlipidemia, obesity, obstructive sleep apnea, history of lower GI bleed, iron deficiency anemia, history of TIA and prior strokes, history of breast cancer status post right mastectomy, aortic atherosclerosis (CT scan 10/2021), a large hiatal hernia.  In June 2023 she was hospitalized due to difficulty moving her eyes, aphasia.  She was diagnosed with transient ischemic attack.  Follow-up imaging revealed prior strokes.  She was referred to cardiology to rule out cardiac embolic etiology.  In the interim, her statin dose was increased and she was started on aspirin 81 mg p.o. daily.  Since last office visit she has completed the cardiac monitor, MPI, and transesophageal echocardiogram.  Results reviewed with her in great detail and noted below for further reference.  Of note, she has not had any recurrent episodes of TIA/stroke.  Clinically she is improving since her last event in June 2023.  Patient is informed that she does have a PFO on her recent transesophageal echocardiogram images reviewed and pathophysiology discussed.  ALLERGIES: No Known Allergies  MEDICATION LIST PRIOR TO VISIT: Current Meds  Medication Sig   albuterol (VENTOLIN HFA) 108 (90 Base) MCG/ACT inhaler Inhale 1-2 puffs into the lungs every 6 (six) hours as needed for wheezing or shortness of breath.   aspirin EC 81 MG tablet Take 1 tablet (81 mg total) by mouth daily. Swallow whole.   atorvastatin (LIPITOR) 40 MG tablet Take 1 tablet (40 mg total) by mouth at bedtime.   esomeprazole (NEXIUM) 20 MG capsule  Take 20 mg by mouth daily before breakfast.   fluticasone (FLONASE) 50 MCG/ACT nasal spray Place 1-2 sprays into both nostrils daily as needed (for seasonal allergies).   folic acid (FOLVITE) 1 MG tablet Take 1 tablet (1 mg total) by mouth daily. Follow up your folate levels and anemia with your PCP.   loratadine (CLARITIN) 10 MG tablet Take 10 mg by mouth daily as needed (for seasonal allergies).   OVER THE COUNTER MEDICATION Take 1 capsule by mouth daily. [place] with Astaxanthin capsules- Take 1 capsule by mouth every morning   OVER THE COUNTER MEDICATION Take 1 capsule by mouth daily. Kelli Hope Essential-1 with Vitamin D3-2000 capsules- Take 1 capsule by mouth once a day after breakfast   polyethylene glycol (MIRALAX / GLYCOLAX) 17 g packet Take 17 g by mouth daily. (Patient taking differently: Take 17 g by mouth as needed for moderate constipation (with iron supplement).)   Vitamin D, Ergocalciferol, (DRISDOL) 1.25 MG (50000 UNIT) CAPS capsule Take 50,000 Units by mouth 2 (two) times a week. Take 50,000 units by mouth every Wednesday and Saturday     PAST MEDICAL HISTORY: Past Medical History:  Diagnosis Date   Acute lower GI bleeding 10/25/2021   Aortic atherosclerosis ([place]) 10/25/2021   Cancer ([place])    Diverticulosis of intestine with bleeding 10/25/2021   GERD (gastroesophageal reflux disease) 10/25/2021   Hyperlipidemia 10/25/2021   Macular degeneration    PONV (postoperative nausea and vomiting)     PAST SURGICAL HISTORY: Past Surgical History:  Procedure Laterality Date   ABDOMINAL HYSTERECTOMY  BREAST EXCISIONAL BIOPSY Right    BREAST LUMPECTOMY WITH RADIOACTIVE SEED LOCALIZATION Right 01/18/2016   Procedure: RIGHT BREAST LUMPECTOMY WITH RADIOACTIVE SEED LOCALIZATION;  Surgeon: Erroll Luna, MD;  Location: [facility];  Service: General;  Laterality: Right;   BREAST SURGERY     BUBBLE STUDY  03/01/2022   Procedure: BUBBLE STUDY;   Surgeon: Rex Kras, DO;  Location: [facility];  Service: Cardiovascular;;   MASTECTOMY Left    TEE WITHOUT CARDIOVERSION N/A 03/01/2022   Procedure: TRANSESOPHAGEAL ECHOCARDIOGRAM (TEE);  Surgeon: Rex Kras, DO;  Location: [facility];  Service: Cardiovascular;  Laterality: N/A;   TURBINATE REDUCTION      FAMILY HISTORY: The patient family history includes Dementia in her paternal grandmother; Stomach cancer in her father; Stroke in her paternal grandfather.  SOCIAL HISTORY:  The patient  reports that she has never smoked. She has never used smokeless tobacco. She reports that she does not drink alcohol and does not use drugs.  REVIEW OF SYSTEMS: Review of Systems  Cardiovascular:  Positive for dyspnea on exertion (chronic and stable). Negative for chest pain, claudication, irregular heartbeat, leg swelling, near-syncope, orthopnea, palpitations, paroxysmal nocturnal dyspnea and syncope.  Respiratory:  Negative for shortness of breath.   Hematologic/Lymphatic: Negative for bleeding problem.  Musculoskeletal:  Negative for muscle cramps and myalgias.  Neurological:  Negative for dizziness and light-headedness.    PHYSICAL EXAM:    03/14/2022   10:37 AM 03/01/2022    1:30 PM 03/01/2022    1:15 PM  Vitals with BMI  Height '5\' 4"'$     Weight 188 lbs 6 oz    BMI 85.27    Systolic [phone]  Diastolic 77 77 74  Pulse 82 75 94   Physical Exam  Constitutional: No distress.  Age appropriate, hemodynamically stable.   Neck: No JVD present.  Cardiovascular: Normal rate, regular rhythm, S1 normal, S2 normal, intact distal pulses and normal pulses. Exam reveals no gallop, no S3 and no S4.  No murmur heard. Pulses:      Dorsalis pedis pulses are 2+ on the right side and 2+ on the left side.       Posterior tibial pulses are 2+ on the right side and 2+ on the left side.  Pulmonary/Chest: Effort normal. No stridor. She has no wheezes. She has no rales.  Abdominal: Soft. Bowel  sounds are normal. She exhibits no distension. There is no abdominal tenderness.  Musculoskeletal:        General: No edema.     Cervical back: Neck supple.  Neurological: She is alert and oriented to person, place, and time. She has intact cranial nerves (2-12).  Skin: Skin is warm and moist.    RADIOLOGY: CT angio GI bleed protocol: 10/25/2021: 1. No evidence of acute abnormalities within the abdomen or pelvis. 2. Diffuse colonic diverticulosis without evidence of acute diverticulitis. 3. Large hiatal hernia. Aortic Atherosclerosis (ICD10-I70.0).  CT of the head without contrast: 11/15/2021 1. No evidence of acute intracranial abnormality. 2. Small remote appearing right cerebellar lacunar infarct. 3. Moderate paranasal sinus mucosal thickening.  MRI brain without contrast: 11/15/2021 1. Subtle 7 mm acute infarct versus artifact within the posterior right frontal lobe subcortical white matter. 2. Background mild cerebral white matter chronic small vessel ischemic disease. 3. Chronic lacunar infarcts within the posterior right lentiform nucleus and medial left thalamus. 4. Subcentimeter chronic infarcts within the bilateral cerebellar hemispheres. 5. Paranasal sinus disease, as described.  CARDIAC DATABASE: EKG: 01/04/2022: Sinus rhythm,  77bpm, without underlying ischemia or injury pattern.  Echocardiogram: 11/16/2021:  1. Left ventricular ejection fraction, by estimation, is 60 to 65%. The left ventricle has normal function. The left ventricle has no regional wall motion abnormalities. Left ventricular diastolic parameters are consistent with Grade I diastolic dysfunction (impaired relaxation).   2. Right ventricular systolic function is normal. The right ventricular size is normal.   3. The mitral valve is normal in structure. No evidence of mitral valve regurgitation. No evidence of mitral stenosis.   4. The aortic valve is normal in structure. Aortic valve regurgitation is not  visualized. No aortic stenosis is present.   5. The IVC is normal in size with greater than 50% respiratory variability, suggesting right atrial pressure of 3 mmHg.  Conclusion(s)/Recommendation(s): No intracardiac source of embolism detected on this transthoracic study. Consider a transesophageal echocardiogram to exclude cardiac source of embolism if clinically indicated.   TEE 03/01/2022:  1. Left ventricular ejection fraction, by estimation, is 60 to 65%. The left ventricle has normal function. The left ventricle has no regional wall motion abnormalities.   2. Right ventricular systolic function is normal. The right ventricular size is normal.   3. No left atrial/left atrial appendage thrombus was detected.   4. The mitral valve is normal in structure. No evidence of mitral valve regurgitation. No evidence of mitral stenosis.   5. The aortic valve is tricuspid. Aortic valve regurgitation is not visualized. No aortic stenosis is present.   6. Evidence of atrial level shunting detected by color flow Doppler. Agitated saline contrast bubble study was positive with shunting observed within 3-6 cardiac cycles suggestive of interatrial shunt.   Stress Testing: Exercise Tetrofosmin stress test 01/24/2022: Exercise nuclear stress test was performed using Bruce protocol. Patient reached 7 METS, and 90% of age predicted maximum heart rate. Exercise capacity was low. No chest pain reported. Heart rate and hemodynamic response were normal. Stress EKG revealed no ischemic changes. Normal myocardial perfusion. Stress LVEF 63%. Low risk study.  Heart Catheterization: None  Cardiac monitor (Zio Patch): January 04, 2022-January 18, 2022 Dominant rhythm sinus, followed by tachycardia (15%). Heart rate 50-193 bpm.  Avg HR 84 bpm. No atrial fibrillation, ventricular tachycardia, high grade AV block, pauses (3 seconds or longer). Total ventricular ectopic burden <1%. Total supraventricular ectopic burden  <1%. Episodes of paroxysmal supraventricular tachycardia. Patient triggered events: 0.   LABORATORY DATA:    Latest Ref Rng & Units 03/01/2022   12:42 PM 11/16/2021    2:07 PM 11/16/2021    2:52 AM  CBC  WBC 4.0 - 10.5 K/uL  6.0  7.0   Hemoglobin 12.0 - 15.0 g/dL 13.9  9.1  8.7   Hematocrit 36.0 - 46.0 % 41.0  28.3  27.5   Platelets 150 - 400 K/uL  457  448        Latest Ref Rng & Units 03/01/2022   12:42 PM 11/16/2021    2:54 AM 11/15/2021   11:30 AM  CMP  Glucose 70 - 99 mg/dL 91  105  119   BUN 8 - 23 mg/dL '11  9  10   '$ Creatinine 0.44 - 1.00 mg/dL 0.60  0.64  0.82   Sodium 135 - 145 mmol/L 142  139  137   Potassium 3.5 - 5.1 mmol/L 4.2  3.6  3.7   Chloride 98 - 111 mmol/L 105  107  107   CO2 22 - 32 mmol/L  23  22   Calcium 8.9 -  10.3 mg/dL  8.9  8.9   Total Protein 6.5 - 8.1 g/dL  6.2  6.6   Total Bilirubin 0.3 - 1.2 mg/dL  0.4  0.4   Alkaline Phos 38 - 126 U/L  78  86   AST 15 - 41 U/L  15  18   ALT 0 - 44 U/L  15  17     Lipid Panel     Component Value Date/Time   CHOL 158 11/15/2021 1130   TRIG 143 11/15/2021 1130   HDL 49 11/15/2021 1130   CHOLHDL 3.2 11/15/2021 1130   VLDL 29 11/15/2021 1130   LDLCALC 80 11/15/2021 1130    No components found for: "NTPROBNP" No results for input(s): "PROBNP" in the last 8760 hours. Recent Labs    11/15/21 1130  TSH 1.110    BMP Recent Labs    10/30/21 0418 11/15/21 1130 11/16/21 0254 03/01/22 1242  NA 141 137 139 142  K 3.7 3.7 3.6 4.2  CL 109 107 107 105  CO2 '26 22 23  '$ --   GLUCOSE 99 119* 105* 91  BUN '9 10 9 11  '$ CREATININE 0.68 0.82 0.64 0.60  CALCIUM 8.4* 8.9 8.9  --   GFRNONAA >60 >60 >60  --     HEMOGLOBIN A1C Lab Results  Component Value Date   HGBA1C 5.4 11/15/2021   MPG 108 11/15/2021    IMPRESSION:    ICD-10-CM   1. TIA (transient ischemic attack)  G45.9 EKG 12-Lead    2. History of stroke  Z86.73     3. PFO (patent foramen ovale)  Q21.12     4. Mixed hyperlipidemia  E78.2      5. Aortic atherosclerosis (H[facility])  I70.0     6. History of GI bleed  Z87.19     7. HX: breast cancer  Z85.3        RECOMMENDATIONS: [name] [name] is a 71 y.o. Caucasian female whose past medical history and cardiac risk factors include: Hyperlipidemia, obesity, obstructive sleep apnea, history of lower GI bleed, iron deficiency anemia, history of TIA and prior strokes, patent foramen ovale, sleep apnea awaiting CPAP, history of breast cancer status post right mastectomy, aortic atherosclerosis (CT scan 10/2021), a large hiatal hernia.  TIA (transient ischemic attack) / History of stroke Index event in June 2023. She has had prior bilateral strokes Echo: Preserved LVEF, no intra-atrial shunting per color Doppler per report. TEE: Preserved EF, sonicated saline study and color Doppler positive for PFO, no significant valvular heart disease. Zio patch: No documented A-fib during the monitoring period.  Predominantly sinus with rare PSVT. MPI: Low risk stress test. Shared decision was to proceed with closure of the patent foramen ovale given her history of TIAs and stroke.  Risks, benefits, alternatives discussed by my partner Dr. Adrian Prows as part of today's office visit.  Patient is agreeable and would like to proceed forward. Continue aspirin and statin therapy. No acute dental issues that need to be addressed.  She goes in for a 23-monthvisit for her biannual checkup.  Patient is aware that she will need dental prophylaxis for the first 6 months after implantation of the septal occluder.  Mixed hyperlipidemia Currently on Lipitor.   She denies myalgia or other side effects. Most recent lipids dated June 2023, independently reviewed as noted above. Recommend a goal LDL <70 mg/dL. Currently managed by primary care provider.  Results of the MPI reviewed with her in great detail as well  as during today's office visit.  Overall low risk study and no additional work-up is warranted at this time  as she is asymptomatic.  Educated on the importance of improving her modifiable cardiovascular risk factors.  Since last office visit patient had a sleep study which is consistent with mild obstructive sleep apnea per patient.  She is awaiting delivery of her CPAP.  Total time spent: 53 minutes discussing and reviewing the images of the transesophageal echocardiogram, discussing the risks, benefits, alternatives to PFO closure with myself and my partner Dr. Einar Gip.  Reviewed the results of the echocardiogram, extended Holter, and stress test.  FINAL MEDICATION LIST END OF ENCOUNTER: No orders of the defined types were placed in this encounter.   There are no discontinued medications.   Current Outpatient Medications:    albuterol (VENTOLIN HFA) 108 (90 Base) MCG/ACT inhaler, Inhale 1-2 puffs into the lungs every 6 (six) hours as needed for wheezing or shortness of breath., Disp: , Rfl:    aspirin EC 81 MG tablet, Take 1 tablet (81 mg total) by mouth daily. Swallow whole., Disp: 30 tablet, Rfl: 11   atorvastatin (LIPITOR) 40 MG tablet, Take 1 tablet (40 mg total) by mouth at bedtime., Disp: 30 tablet, Rfl: 11   esomeprazole (NEXIUM) 20 MG capsule, Take 20 mg by mouth daily before breakfast., Disp: , Rfl:    fluticasone (FLONASE) 50 MCG/ACT nasal spray, Place 1-2 sprays into both nostrils daily as needed (for seasonal allergies)., Disp: , Rfl:    folic acid (FOLVITE) 1 MG tablet, Take 1 tablet (1 mg total) by mouth daily. Follow up your folate levels and anemia with your PCP., Disp: 30 tablet, Rfl: 3   loratadine (CLARITIN) 10 MG tablet, Take 10 mg by mouth daily as needed (for seasonal allergies)., Disp: , Rfl:    OVER THE COUNTER MEDICATION, Take 1 capsule by mouth daily. [place] with Astaxanthin capsules- Take 1 capsule by mouth every morning, Disp: , Rfl:    OVER THE COUNTER MEDICATION, Take 1 capsule by mouth daily. [place] Essential-1 with Vitamin D3-2000  capsules- Take 1 capsule by mouth once a day after breakfast, Disp: , Rfl:    polyethylene glycol (MIRALAX / GLYCOLAX) 17 g packet, Take 17 g by mouth daily. (Patient taking differently: Take 17 g by mouth as needed for moderate constipation (with iron supplement).), Disp: 14 each, Rfl: 0   Vitamin D, Ergocalciferol, (DRISDOL) 1.25 MG (50000 UNIT) CAPS capsule, Take 50,000 Units by mouth 2 (two) times a week. Take 50,000 units by mouth every Wednesday and Saturday, Disp: , Rfl:    ferrous sulfate 325 (65 FE) MG tablet, Take 1 tablet (325 mg total) by mouth 3 (three) times a week., Disp: 12 tablet, Rfl: 3  Orders Placed This Encounter  Procedures   EKG 12-Lead    There are no Patient Instructions on file for this visit.   --Continue cardiac medications as reconciled in final medication list. --Return in about 6 weeks (around 04/25/2022) for Follow up s/p PFO closure. or sooner if needed. --Continue follow-up with your primary care physician regarding the management of your other chronic comorbid conditions.  Patient's questions and concerns were addressed to her satisfaction. She voices understanding of the instructions provided during this encounter.   This note was created using a voice recognition software as a result there may be grammatical errors inadvertently enclosed that do not reflect the nature of this encounter. Every attempt is made to correct such  errors.  Rex Kras, [place], [facility]  Pager: [phone] Office: [phone]

## 2022-03-30 ENCOUNTER — Other Ambulatory Visit: Payer: Self-pay | Admitting: Cardiology

## 2022-03-30 ENCOUNTER — Other Ambulatory Visit: Payer: Self-pay

## 2022-03-30 DIAGNOSIS — G459 Transient cerebral ischemic attack, unspecified: Secondary | ICD-10-CM

## 2022-03-31 LAB — CMP14+EGFR
ALT: 21 IU/L (ref 0–32)
AST: 20 IU/L (ref 0–40)
Albumin/Globulin Ratio: 1.7 (ref 1.2–2.2)
Albumin: 4.5 g/dL (ref 3.8–4.8)
Alkaline Phosphatase: 105 IU/L (ref 44–121)
BUN/Creatinine Ratio: 12 (ref 12–28)
BUN: 8 mg/dL (ref 8–27)
Bilirubin Total: 0.4 mg/dL (ref 0.0–1.2)
CO2: 23 mmol/L (ref 20–29)
Calcium: 9.6 mg/dL (ref 8.7–10.3)
Chloride: 104 mmol/L (ref 96–106)
Creatinine, Ser: 0.69 mg/dL (ref 0.57–1.00)
Globulin, Total: 2.7 g/dL (ref 1.5–4.5)
Glucose: 108 mg/dL — ABNORMAL HIGH (ref 70–99)
Potassium: 4.4 mmol/L (ref 3.5–5.2)
Sodium: 140 mmol/L (ref 134–144)
Total Protein: 7.2 g/dL (ref 6.0–8.5)
eGFR: 92 mL/min/{1.73_m2} (ref >59)

## 2022-03-31 LAB — CBC
Hematocrit: 39.6 % (ref 34.0–46.6)
Hemoglobin: 12.5 g/dL (ref 11.1–15.9)
MCH: 27.2 pg (ref 26.6–33.0)
MCHC: 31.6 g/dL (ref 31.5–35.7)
MCV: 86 fL (ref 79–97)
Platelets: 397 10*3/uL (ref 150–450)
RBC: 4.59 x10E6/uL (ref 3.77–5.28)
RDW: 17.1 % — ABNORMAL HIGH (ref 11.7–15.4)
WBC: 8 10*3/uL (ref 3.4–10.8)

## 2022-04-03 NOTE — Progress Notes (Signed)
Called and spoke with patient regarding her recent lab results.

## 2022-04-04 ENCOUNTER — Telehealth: Payer: Self-pay

## 2022-04-04 NOTE — Telephone Encounter (Signed)
Patient is scheduled for a surgery on Friday 04/07/22. [facility] contacted her about pre op questions and medication information, and she added that she is now taking a cough medication (extromethorphan hbr, guaifenesin solution). They asked that she call and see if this is okay or if she needs to cancel and reschedule her procedure.

## 2022-04-04 NOTE — Telephone Encounter (Signed)
She does not need to cancel procedure unless she is sick or has covid

## 2022-04-06 NOTE — Telephone Encounter (Signed)
Patient aware she does not need to cancel unless sick or has covid. Patient denies both, and says she is no longer taking the cough medication. She is now just drinking lemon and ginger tea for the cough.

## 2022-04-07 ENCOUNTER — Other Ambulatory Visit: Payer: Self-pay

## 2022-04-07 ENCOUNTER — Encounter (HOSPITAL_COMMUNITY)
Admission: RE | Disposition: A | Discharge status: Home / Self Care | Payer: Self-pay | Source: Home / Self Care | Attending: Cardiology

## 2022-04-07 ENCOUNTER — Ambulatory Visit (HOSPITAL_COMMUNITY)
Admission: RE | Admit: 2022-04-07 | Discharge: 2022-04-07 | Disposition: A | Discharge status: Home / Self Care | Payer: Medicare PPO | Attending: Cardiology | Admitting: Cardiology

## 2022-04-07 ENCOUNTER — Other Ambulatory Visit (HOSPITAL_COMMUNITY): Payer: Self-pay

## 2022-04-07 ENCOUNTER — Ambulatory Visit (HOSPITAL_COMMUNITY): Payer: Medicare PPO

## 2022-04-07 DIAGNOSIS — E782 Mixed hyperlipidemia: Secondary | ICD-10-CM | POA: Diagnosis not present

## 2022-04-07 DIAGNOSIS — Z79899 Other long term (current) drug therapy: Secondary | ICD-10-CM | POA: Insufficient documentation

## 2022-04-07 DIAGNOSIS — G459 Transient cerebral ischemic attack, unspecified: Secondary | ICD-10-CM | POA: Diagnosis not present

## 2022-04-07 DIAGNOSIS — G4733 Obstructive sleep apnea (adult) (pediatric): Secondary | ICD-10-CM | POA: Diagnosis not present

## 2022-04-07 DIAGNOSIS — I700 Atherosclerosis of aorta: Secondary | ICD-10-CM | POA: Insufficient documentation

## 2022-04-07 DIAGNOSIS — I749 Embolism and thrombosis of unspecified artery: Secondary | ICD-10-CM | POA: Diagnosis not present

## 2022-04-07 DIAGNOSIS — Z8673 Personal history of transient ischemic attack (TIA), and cerebral infarction without residual deficits: Secondary | ICD-10-CM | POA: Insufficient documentation

## 2022-04-07 DIAGNOSIS — Z853 Personal history of malignant neoplasm of breast: Secondary | ICD-10-CM | POA: Insufficient documentation

## 2022-04-07 DIAGNOSIS — Q2111 Secundum atrial septal defect: Secondary | ICD-10-CM | POA: Insufficient documentation

## 2022-04-07 DIAGNOSIS — Z8774 Personal history of (corrected) congenital malformations of heart and circulatory system: Secondary | ICD-10-CM

## 2022-04-07 DIAGNOSIS — Z7982 Long term (current) use of aspirin: Secondary | ICD-10-CM | POA: Insufficient documentation

## 2022-04-07 DIAGNOSIS — Q2112 Patent foramen ovale: Secondary | ICD-10-CM | POA: Diagnosis not present

## 2022-04-07 DIAGNOSIS — D509 Iron deficiency anemia, unspecified: Secondary | ICD-10-CM | POA: Insufficient documentation

## 2022-04-07 HISTORY — PX: PATENT FORAMEN OVALE(PFO) CLOSURE: CATH118300

## 2022-04-07 LAB — ECHOCARDIOGRAM LIMITED
Height: 64.5 in
Weight: 3008 oz

## 2022-04-07 LAB — POCT ACTIVATED CLOTTING TIME
Activated Clotting Time: 215 seconds
Activated Clotting Time: 251 seconds
Activated Clotting Time: 275 seconds
Activated Clotting Time: 245 seconds

## 2022-04-07 SURGERY — PATENT FORAMEN OVALE (PFO) CLOSURE
Anesthesia: LOCAL

## 2022-04-07 MED ORDER — FENTANYL CITRATE (PF) 100 MCG/2ML IJ SOLN
INTRAMUSCULAR | Status: DC | PRN
  Administered 2022-04-07 (×2): 25 ug via INTRAVENOUS
  Administered 2022-04-07 (×2): 50 ug via INTRAVENOUS
  Administered 2022-04-07 (×2): 25 ug via INTRAVENOUS

## 2022-04-07 MED ORDER — IOHEXOL 350 MG/ML SOLN
INTRAVENOUS | Status: DC | PRN
  Administered 2022-04-07: 10 mL

## 2022-04-07 MED ORDER — CEFAZOLIN SODIUM-DEXTROSE 2-4 GM/100ML-% IV SOLN
INTRAVENOUS | Status: AC
  Filled 2022-04-07: qty 100

## 2022-04-07 MED ORDER — SODIUM CHLORIDE 0.9% FLUSH: 3.0000 mL | INTRAVENOUS | Status: DC | PRN

## 2022-04-07 MED ORDER — LIDOCAINE HCL (PF) 1 % IJ SOLN
INTRAMUSCULAR | Status: DC | PRN
  Administered 2022-04-07 (×2): 5 mL via INTRADERMAL

## 2022-04-07 MED ORDER — LIDOCAINE HCL (PF) 1 % IJ SOLN
INTRAMUSCULAR | Status: AC
  Filled 2022-04-07: qty 30

## 2022-04-07 MED ORDER — CEFAZOLIN SODIUM-DEXTROSE 2-4 GM/100ML-% IV SOLN: 2.0000 g | INTRAVENOUS | Status: DC

## 2022-04-07 MED ORDER — SODIUM CHLORIDE 0.9 % WEIGHT BASED INFUSION: 3.0000 mL/kg/h | INTRAVENOUS | Status: DC

## 2022-04-07 MED ORDER — FENTANYL CITRATE (PF) 100 MCG/2ML IJ SOLN
INTRAMUSCULAR | Status: AC
  Filled 2022-04-07: qty 2

## 2022-04-07 MED ORDER — CLOPIDOGREL BISULFATE 75 MG PO TABS
75.0000 mg | ORAL_TABLET | Freq: Every day | ORAL | 0 refills | Status: DC
  Filled 2022-04-07: qty 30, 30d supply, fill #0

## 2022-04-07 MED ORDER — SODIUM CHLORIDE 0.9% FLUSH: 3.0000 mL | Freq: Two times a day (BID) | INTRAVENOUS | Status: DC

## 2022-04-07 MED ORDER — SODIUM CHLORIDE 0.9 % WEIGHT BASED INFUSION: 1.0000 mL/kg/h | INTRAVENOUS | Status: DC

## 2022-04-07 MED ORDER — HEPARIN SODIUM (PORCINE) 1000 UNIT/ML IJ SOLN
INTRAMUSCULAR | Status: AC
  Filled 2022-04-07: qty 10

## 2022-04-07 MED ORDER — SODIUM CHLORIDE 0.9 % WEIGHT BASED INFUSION: 3.0000 mL/kg/h | INTRAVENOUS | Status: AC

## 2022-04-07 MED ORDER — SODIUM CHLORIDE 0.9 % IV SOLN: 250.0000 mL | INTRAVENOUS | Status: DC | PRN

## 2022-04-07 MED ORDER — ASPIRIN 81 MG PO CHEW: 81.0000 mg | CHEWABLE_TABLET | ORAL | Status: DC

## 2022-04-07 MED ORDER — ONDANSETRON HCL 4 MG/2ML IJ SOLN: 4.0000 mg | Freq: Four times a day (QID) | INTRAMUSCULAR | Status: DC | PRN

## 2022-04-07 MED ORDER — HYDRALAZINE HCL 20 MG/ML IJ SOLN: 5.0000 mg | INTRAMUSCULAR | Status: AC | PRN

## 2022-04-07 MED ORDER — MIDAZOLAM HCL 2 MG/2ML IJ SOLN
INTRAMUSCULAR | Status: AC
  Filled 2022-04-07: qty 2

## 2022-04-07 MED ORDER — HEPARIN SODIUM (PORCINE) 1000 UNIT/ML IJ SOLN
INTRAMUSCULAR | Status: DC | PRN
  Administered 2022-04-07: 1500 [IU] via INTRAVENOUS
  Administered 2022-04-07: 3000 [IU] via INTRAVENOUS
  Administered 2022-04-07: 2000 [IU] via INTRAVENOUS
  Administered 2022-04-07: 1000 [IU] via INTRAVENOUS
  Administered 2022-04-07: 7000 [IU] via INTRAVENOUS

## 2022-04-07 MED ORDER — ACETAMINOPHEN 325 MG PO TABS
650.0000 mg | ORAL_TABLET | ORAL | Status: DC | PRN
  Administered 2022-04-07: 650 mg via ORAL
  Filled 2022-04-07: qty 2

## 2022-04-07 MED ORDER — CLOPIDOGREL BISULFATE 75 MG PO TABS
300.0000 mg | ORAL_TABLET | Freq: Once | ORAL | Status: AC
  Administered 2022-04-07: 300 mg via ORAL
  Filled 2022-04-07: qty 4

## 2022-04-07 MED ORDER — FAMOTIDINE 20 MG PO TABS
20.0000 mg | ORAL_TABLET | Freq: Two times a day (BID) | ORAL | 1 refills | Status: DC
  Filled 2022-04-07: qty 60, 30d supply, fill #0

## 2022-04-07 MED ORDER — MIDAZOLAM HCL 2 MG/2ML IJ SOLN
INTRAMUSCULAR | Status: DC | PRN
  Administered 2022-04-07 (×2): 2 mg via INTRAVENOUS

## 2022-04-07 MED ORDER — CEFAZOLIN SODIUM-DEXTROSE 2-3 GM-%(50ML) IV SOLR
INTRAVENOUS | Status: DC | PRN
  Administered 2022-04-07: 2 g via INTRAVENOUS

## 2022-04-07 MED ORDER — DIAZEPAM 2 MG PO TABS: 2.0000 mg | ORAL_TABLET | ORAL | Status: DC | PRN

## 2022-04-07 SURGICAL SUPPLY — 25 items
BALLN SIZING AMPLATZER 24 (BALLOONS) ×1
BALLOON SIZING AMPLATZER 24 (BALLOONS) IMPLANT
BALN SZ 70X4.5 7FR 3 LUM (BALLOONS) ×1
CATH ACUNAV REPROCESSED (CATHETERS) IMPLANT
CATH SUPER TORQUE PLUS 6F MPA1 (CATHETERS) IMPLANT
CLOSURE PERCLOSE PROSTYLE (VASCULAR PRODUCTS) IMPLANT
GUIDEWIRE AMPLATZER 1.5JX260 (WIRE) IMPLANT
KIT HEART LEFT (KITS) ×2 IMPLANT
KIT MICROPUNCTURE NIT STIFF (SHEATH) IMPLANT
OCCLUDER AMPLATZER SEPTAL 16MM (Prosthesis & Implant Heart) IMPLANT
OCCLUDER PFO TALISMAN 30-25 (Prosthesis & Implant Heart) IMPLANT
PACK CARDIAC CATHETERIZATION (CUSTOM PROCEDURE TRAY) ×2 IMPLANT
PROTECTION STATION PRESSURIZED (MISCELLANEOUS) ×1
SHEATH DELIVERY TALISMAN 9F 80 (SHEATH) IMPLANT
SHEATH INTROD W/O MIN 9FR 25CM (SHEATH) IMPLANT
SHEATH PINNACLE 8F 10CM (SHEATH) IMPLANT
SHEATH PROBE COVER 6X72 (BAG) IMPLANT
STATION PROTECTION PRESSURIZED (MISCELLANEOUS) IMPLANT
SYS DELIVER AMP TREVISIO 8FR (SHEATH) ×1
SYSTEM DELIVER AMP TREVIS 8FR (SHEATH) IMPLANT
TALISMAN DELIVERY SHEATH 9F 80 (SHEATH) ×1
TALISMAN PFO OCCLUDER 30-25 (Prosthesis & Implant Heart) IMPLANT
TRANSDUCER W/STOPCOCK (MISCELLANEOUS) ×2 IMPLANT
TUBING CIL FLEX 10 FLL-RA (TUBING) ×2 IMPLANT
WIRE EMERALD 3MM-J .035X150CM (WIRE) IMPLANT

## 2022-04-07 NOTE — Progress Notes (Signed)
  Echocardiogram 2D Echocardiogram has been performed.  [name] [name] 04/07/2022, 4:17 PM

## 2022-04-07 NOTE — Progress Notes (Signed)
1240 raised head of bed by 15 degrees, checked groin site and found dressing to be saturated. Head of bed put back down to starting position. Changed dressing and held pressure to site for 15 minutes. Dr. Einar Gip at bedside, will continue to monitor [facility]

## 2022-04-07 NOTE — Interval H&P Note (Signed)
History and Physical Interval Note:  04/07/2022 8:03 AM  [name] [name]  has presented today for surgery, with the diagnosis of TIA.  The various methods of treatment have been discussed with the patient and family. After consideration of risks, benefits and other options for treatment, the patient has consented to  Procedure(s): PATENT FORAMEN OVALE(PFO) CLOSURE (N/A) as a surgical intervention.  The patient's history has been reviewed, patient examined, no change in status, stable for surgery.  I have reviewed the patient's chart and labs.  Questions were answered to the patient's satisfaction.     Adrian Prows

## 2022-04-10 ENCOUNTER — Encounter (HOSPITAL_COMMUNITY): Payer: Self-pay | Admitting: Cardiology

## 2022-04-25 ENCOUNTER — Encounter: Payer: Self-pay | Admitting: Cardiology

## 2022-04-25 ENCOUNTER — Ambulatory Visit: Payer: Medicare PPO | Admitting: Cardiology

## 2022-04-25 ENCOUNTER — Ambulatory Visit
Admission: RE | Admit: 2022-04-25 | Discharge: 2022-04-25 | Disposition: A | Discharge status: Home / Self Care | Payer: Medicare PPO | Source: Ambulatory Visit | Attending: Cardiology | Admitting: Cardiology

## 2022-04-25 VITALS — BP 130/80 | HR 82 | Resp 18 | Ht 64.5 in | Wt 184.2 lb

## 2022-04-25 DIAGNOSIS — E782 Mixed hyperlipidemia: Secondary | ICD-10-CM

## 2022-04-25 DIAGNOSIS — Q2112 Patent foramen ovale: Secondary | ICD-10-CM | POA: Diagnosis not present

## 2022-04-25 DIAGNOSIS — I700 Atherosclerosis of aorta: Secondary | ICD-10-CM | POA: Diagnosis not present

## 2022-04-25 DIAGNOSIS — Z853 Personal history of malignant neoplasm of breast: Secondary | ICD-10-CM

## 2022-04-25 DIAGNOSIS — Z8774 Personal history of (corrected) congenital malformations of heart and circulatory system: Secondary | ICD-10-CM | POA: Diagnosis not present

## 2022-04-25 DIAGNOSIS — R0602 Shortness of breath: Secondary | ICD-10-CM | POA: Diagnosis not present

## 2022-04-25 DIAGNOSIS — R062 Wheezing: Secondary | ICD-10-CM | POA: Diagnosis not present

## 2022-04-25 DIAGNOSIS — Z8673 Personal history of transient ischemic attack (TIA), and cerebral infarction without residual deficits: Secondary | ICD-10-CM | POA: Diagnosis not present

## 2022-04-25 DIAGNOSIS — G459 Transient cerebral ischemic attack, unspecified: Secondary | ICD-10-CM

## 2022-04-25 DIAGNOSIS — R059 Cough, unspecified: Secondary | ICD-10-CM | POA: Diagnosis not present

## 2022-04-25 MED ORDER — CLOPIDOGREL BISULFATE 75 MG PO TABS: 75.0000 mg | ORAL_TABLET | Freq: Every day | ORAL | 0 refills | Status: DC

## 2022-04-25 NOTE — Progress Notes (Signed)
ID:  [name] [name], DOB [birth year], MRN [id number]  PCP:  [name], MD  Cardiologist:  Rex Kras, DO, [facility] (established care 01/04/2022)  Date: 04/25/22 Last Office Visit: 03/14/2022  Chief Complaint  Patient presents with   Follow-up    6 weeks post op PFO closure    HPI  [name] [name] is a 71 y.o. Caucasian female whose  past medical history and cardiovascular risk factors include: Hyperlipidemia, obesity, obstructive sleep apnea, history of lower GI bleed, iron deficiency anemia, history of TIA and prior strokes, Patent PFO s/p 16 mm Amplatzer septal occlude, history of breast cancer status post right mastectomy, aortic atherosclerosis (CT scan 10/2021), a large hiatal hernia.  In June 2023 patient was hospitalized due to difficulty moving her eyes and aphasia and was diagnosed with a TIA.  Imaging was performed which also revealed prior strokes.  She was referred to cardiology to rule out embolic etiologies.  She had a TEE which noted a PFO and given her history of strokes/TIA shared decision was to proceed with closure.  She underwent PFO closure in November 2023.  She needs antibiotic prophylaxis for 6 months postprocedure, dual antiplatelet therapy for the first 90 days, and aspirin thereafter.  The patient states that since spring 2023 she has been having shortness of breath, productive cough and intermittent wheezing which is still present.  She has tried albuterol inhaler with very minimal improvement.  She will follow-up with PCP for further guidance.  She is worried that excessive coughing may have led to displacement of the recent PFO closure device. She has been compliant with her dual antiplatelet therapy.  Her groin site has healed well.  ALLERGIES: No Known Allergies  MEDICATION LIST PRIOR TO VISIT: Current Meds  Medication Sig   albuterol (VENTOLIN HFA) 108 (90 Base) MCG/ACT inhaler Inhale 1-2 puffs into the lungs every 6 (six) hours as needed for wheezing or  shortness of breath.   aspirin EC 81 MG tablet Take 1 tablet (81 mg total) by mouth daily. Swallow whole.   atorvastatin (LIPITOR) 40 MG tablet Take 1 tablet (40 mg total) by mouth at bedtime.   Dextromethorphan-guaiFENesin 10-100 MG/5ML liquid Take 20 mLs by mouth 3 (three) times daily as needed for cough.   famotidine (PEPCID) 20 MG tablet Take 1 tablet (20 mg total) by mouth 2 (two) times daily.   fluticasone (FLONASE) 50 MCG/ACT nasal spray Place 1-2 sprays into both nostrils daily as needed (for seasonal allergies).   loratadine (CLARITIN) 10 MG tablet Take 10 mg by mouth daily as needed (for seasonal allergies).   OVER THE COUNTER MEDICATION Take 1 capsule by mouth daily. [place] with Astaxanthin capsules- Take 1 capsule by mouth every morning   OVER THE COUNTER MEDICATION Take 1 capsule by mouth daily. [place]elli Hope Essential-1 with Vitamin D3-2000 capsules- Take 1 capsule by mouth once a day after breakfast   polyethylene glycol (MIRALAX / GLYCOLAX) 17 g packet Take 17 g by mouth daily. (Patient taking differently: Take 17 g by mouth daily as needed for moderate constipation (with iron supplement).)   Vitamin D, Ergocalciferol, (DRISDOL) 1.25 MG (50000 UNIT) CAPS capsule Take 50,000 Units by mouth 2 (two) times a week. Take 50,000 units by mouth every Wednesday and Saturday   [DISCONTINUED] clopidogrel (PLAVIX) 75 MG tablet Take 1 tablet (75 mg total) by mouth daily.     PAST MEDICAL HISTORY: Past Medical History:  Diagnosis Date   Acute lower GI bleeding  10/25/2021   Aortic atherosclerosis ([place]) 10/25/2021   Cancer ([place])    Diverticulosis of intestine with bleeding 10/25/2021   GERD (gastroesophageal reflux disease) 10/25/2021   Hyperlipidemia 10/25/2021   Macular degeneration    PONV (postoperative nausea and vomiting)     PAST SURGICAL HISTORY: Past Surgical History:  Procedure Laterality Date   ABDOMINAL HYSTERECTOMY     BREAST EXCISIONAL BIOPSY Right     BREAST LUMPECTOMY WITH RADIOACTIVE SEED LOCALIZATION Right 01/18/2016   Procedure: RIGHT BREAST LUMPECTOMY WITH RADIOACTIVE SEED LOCALIZATION;  Surgeon: Erroll Luna, MD;  Location: [facility];  Service: General;  Laterality: Right;   BREAST SURGERY     BUBBLE STUDY  03/01/2022   Procedure: BUBBLE STUDY;  Surgeon: Rex Kras, DO;  Location: [facility];  Service: Cardiovascular;;   MASTECTOMY Left    PATENT FORAMEN OVALE(PFO) CLOSURE N/A 04/07/2022   Procedure: PATENT FORAMEN OVALE(PFO) CLOSURE;  Surgeon: Adrian Prows, MD;  Location: [facility];  Service: Cardiovascular;  Laterality: N/A;   TEE WITHOUT CARDIOVERSION N/A 03/01/2022   Procedure: TRANSESOPHAGEAL ECHOCARDIOGRAM (TEE);  Surgeon: Rex Kras, DO;  Location: [facility];  Service: Cardiovascular;  Laterality: N/A;   TURBINATE REDUCTION      FAMILY HISTORY: The patient family history includes Dementia in her paternal grandmother; Stomach cancer in her father; Stroke in her paternal grandfather.  SOCIAL HISTORY:  The patient  reports that she has never smoked. She has never used smokeless tobacco. She reports that she does not drink alcohol and does not use drugs.  REVIEW OF SYSTEMS: Review of Systems  Constitutional: Positive for malaise/fatigue.  Cardiovascular:  Negative for chest pain, claudication, irregular heartbeat, leg swelling, near-syncope, orthopnea, palpitations, paroxysmal nocturnal dyspnea and syncope.  Respiratory:  Positive for cough, sputum production and wheezing. Negative for shortness of breath.   Hematologic/Lymphatic: Negative for bleeding problem.  Musculoskeletal:  Negative for muscle cramps and myalgias.  Neurological:  Negative for dizziness and light-headedness.    PHYSICAL EXAM:    04/25/2022   11:59 AM 04/25/2022   10:48 AM 04/07/2022    4:30 PM  Vitals with BMI  Height  5' 4.5"   Weight  184 lbs 3 oz   BMI  45.80   Systolic 998 338 250  Diastolic 80 89 77   Pulse 82 96 72   Physical Exam  Constitutional: No distress.  Age appropriate, hemodynamically stable.   Neck: No JVD present.  Cardiovascular: Normal rate, regular rhythm, S1 normal, S2 normal, intact distal pulses and normal pulses. Exam reveals no gallop, no S3 and no S4.  No murmur heard. Pulses:      Dorsalis pedis pulses are 2+ on the right side and 2+ on the left side.       Posterior tibial pulses are 2+ on the right side and 2+ on the left side.  Pulmonary/Chest: Effort normal. No stridor. She has no rales. She has diffuse wheezes.  Abdominal: Soft. Bowel sounds are normal. She exhibits no distension. There is no abdominal tenderness.  Musculoskeletal:        General: No edema.     Cervical back: Neck supple.  Neurological: She is alert and oriented to person, place, and time. She has intact cranial nerves (2-12).  Skin: Skin is warm and moist.   RADIOLOGY: CT angio GI bleed protocol: 10/25/2021: 1. No evidence of acute abnormalities within the abdomen or pelvis. 2. Diffuse colonic diverticulosis without evidence of acute diverticulitis. 3. Large hiatal hernia. Aortic Atherosclerosis (ICD10-I70.0).  CT of the head without contrast: 11/15/2021 1. No evidence of acute intracranial abnormality. 2. Small remote appearing right cerebellar lacunar infarct. 3. Moderate paranasal sinus mucosal thickening.  MRI brain without contrast: 11/15/2021 1. Subtle 7 mm acute infarct versus artifact within the posterior right frontal lobe subcortical white matter. 2. Background mild cerebral white matter chronic small vessel ischemic disease. 3. Chronic lacunar infarcts within the posterior right lentiform nucleus and medial left thalamus. 4. Subcentimeter chronic infarcts within the bilateral cerebellar hemispheres. 5. Paranasal sinus disease, as described.  CARDIAC DATABASE: EKG: 01/04/2022: Sinus rhythm, 77bpm, without underlying ischemia or injury  pattern.  Echocardiogram: 11/16/2021:  1. Left ventricular ejection fraction, by estimation, is 60 to 65%. The left ventricle has normal function. The left ventricle has no regional wall motion abnormalities. Left ventricular diastolic parameters are consistent with Grade I diastolic dysfunction (impaired relaxation).   2. Right ventricular systolic function is normal. The right ventricular size is normal.   3. The mitral valve is normal in structure. No evidence of mitral valve regurgitation. No evidence of mitral stenosis.   4. The aortic valve is normal in structure. Aortic valve regurgitation is not visualized. No aortic stenosis is present.   5. The IVC is normal in size with greater than 50% respiratory variability, suggesting right atrial pressure of 3 mmHg.  Conclusion(s)/Recommendation(s): No intracardiac source of embolism detected on this transthoracic study. Consider a transesophageal echocardiogram to exclude cardiac source of embolism if clinically indicated.   TEE 03/01/2022:  1. Left ventricular ejection fraction, by estimation, is 60 to 65%. The left ventricle has normal function. The left ventricle has no regional wall motion abnormalities.   2. Right ventricular systolic function is normal. The right ventricular size is normal.   3. No left atrial/left atrial appendage thrombus was detected.   4. The mitral valve is normal in structure. No evidence of mitral valve regurgitation. No evidence of mitral stenosis.   5. The aortic valve is tricuspid. Aortic valve regurgitation is not visualized. No aortic stenosis is present.   6. Evidence of atrial level shunting detected by color flow Doppler. Agitated saline contrast bubble study was positive with shunting observed within 3-6 cardiac cycles suggestive of interatrial shunt.   Stress Testing: Exercise Tetrofosmin stress test 01/24/2022: Exercise nuclear stress test was performed using Bruce protocol. Patient reached 7 METS, and 90%  of age predicted maximum heart rate. Exercise capacity was low. No chest pain reported. Heart rate and hemodynamic response were normal. Stress EKG revealed no ischemic changes. Normal myocardial perfusion. Stress LVEF 63%. Low risk study.  Heart Catheterization: None  Atrial septal defect closure 04/03/2022: Successful closure of the atrial septal defect using a 16 mm Amplatzer septal occluder, although the defect was PFO, had secundum ASD anatomy and very complex septum with severe aneurysmal motion.   Patient will need antibiotic prophylaxis for 6 months.  She will be on aspirin indefinitely and Plavix for 90 days.  Cardiac monitor (Zio Patch): January 04, 2022-January 18, 2022 Dominant rhythm sinus, followed by tachycardia (15%). Heart rate 50-193 bpm.  Avg HR 84 bpm. No atrial fibrillation, ventricular tachycardia, high grade AV block, pauses (3 seconds or longer). Total ventricular ectopic burden <1%. Total supraventricular ectopic burden <1%. Episodes of paroxysmal supraventricular tachycardia. Patient triggered events: 0.   LABORATORY DATA:    Latest Ref Rng & Units 03/30/2022    1:38 PM 03/01/2022   12:42 PM 11/16/2021    2:07 PM  CBC  WBC 3.4 - 10.8  x10E3/uL 8.0   6.0   Hemoglobin 11.1 - 15.9 g/dL 12.5  13.9  9.1   Hematocrit 34.0 - 46.6 % 39.6  41.0  28.3   Platelets 150 - 450 x10E3/uL 397   457        Latest Ref Rng & Units 03/30/2022    1:38 PM 03/01/2022   12:42 PM 11/16/2021    2:54 AM  CMP  Glucose 70 - 99 mg/dL 108  91  105   BUN 8 - 27 mg/dL _0 Creatinine 0.57 - 1.00 mg/dL 0.69  0.60  0.64   Sodium 134 - 144 mmol/L 140  142  139   Potassium 3.5 - 5.2 mmol/L 4.4  4.2  3.6   Chloride 96 - 106 mmol/L 104  105  107   CO2 20 - 29 mmol/L 23   23   Calcium 8.7 - 10.3 mg/dL 9.6   8.9   Total Protein 6.0 - 8.5 g/dL 7.2   6.2   Total Bilirubin 0.0 - 1.2 mg/dL 0.4   0.4   Alkaline Phos 44 - 121 IU/L 105   78   AST 0 - 40 IU/L 20   15   ALT 0 - 32 IU/L 21    15     Lipid Panel     Component Value Date/Time   CHOL 158 11/15/2021 1130   TRIG 143 11/15/2021 1130   HDL 49 11/15/2021 1130   CHOLHDL 3.2 11/15/2021 1130   VLDL 29 11/15/2021 1130   LDLCALC 80 11/15/2021 1130    No components found for: "NTPROBNP" No results for input(s): "PROBNP" in the last 8760 hours. Recent Labs    11/15/21 1130  TSH 1.110    BMP Recent Labs    10/30/21 0418 11/15/21 1130 11/16/21 0254 03/01/22 1242 03/30/22 1338  NA 141 137 139 142 140  K 3.7 3.7 3.6 4.2 4.4  CL 109 107 107 105 104  CO2 _1 --  23  GLUCOSE 99 119* 105* 91 108*  BUN _2 CREATININE 0.68 0.82 0.64 0.60 0.69  CALCIUM 8.4* 8.9 8.9  --  9.6  GFRNONAA >60 >60 >60  --   --     HEMOGLOBIN A1C Lab Results  Component Value Date   HGBA1C 5.4 11/15/2021   MPG 108 11/15/2021    IMPRESSION:    ICD-10-CM   1. Shortness of breath  R06.02 DG Chest 2 View    CMP14+EGFR    Pro b natriuretic peptide (BNP)[facility])    CBC w/Diff/Platelet    2. Wheezing  R06.2 DG Chest 2 View    CMP14+EGFR    Pro b natriuretic peptide (BNP)[facility])    CBC w/Diff/Platelet    3. TIA (transient ischemic attack)  G45.9     4. History of stroke  Z86.73     5. PFO (patent foramen ovale)  Q21.12 clopidogrel (PLAVIX) 75 MG tablet    6. Status post device closure of PFO  Z87.74 clopidogrel (PLAVIX) 75 MG tablet    7. Mixed hyperlipidemia  E78.2     8. Aortic atherosclerosis ([facility])  I70.0     9. HX: breast cancer  Z85.3        RECOMMENDATIONS: MICKIE BADDERS is a 71 y.o. Caucasian female whose past medical history and cardiac risk factors include: Hyperlipidemia, obesity, obstructive sleep apnea, history of lower GI bleed, iron  deficiency anemia, history of TIA and prior strokes, patent foramen ovale, sleep apnea awaiting CPAP, history of breast cancer status post right mastectomy, aortic atherosclerosis (CT scan 10/2021), a large  hiatal hernia.  Shortness of breath/expiratory wheezes: Clinically appears to be euvolemic and not in congestive heart failure. She has expiratory wheezes bilaterally. And has had a coughing spell since spring 2023 along with a sputum production. She was given albuterol inhaler which has provided very minimal improvement. I have advised the patient to reach out to PCP for further evaluation and management. In the interim would like to check a chest x-ray to rule out infection or vascular congestion. Will check a CMP and BNP. Given the recent surgery we will check hemoglobin to rule out anemia. I would like to see her back in close follow-up with no significant improvement will repeat echocardiogram.  TIA (transient ischemic attack) / History of stroke Index event in June 2023. She has had prior bilateral strokes Echo: Preserved LVEF, no intra-atrial shunting per color Doppler per report. TEE: Preserved EF, sonicated saline study and color Doppler positive for PFO. Zio patch: No documented A-fib during the monitoring period.  Predominantly sinus with rare PSVT. MPI: Low risk stress test. She has been evaluated for sleep apnea and is awaiting a BiPAP Educate on importance of secondary prevention.  Status post PFO closure Status post 12m Amplatzer septal occluder. Dual antiplatelet therapy for 90 days and aspirin thereafter. Antibiotic prophylaxis for 6 months post procedure  Mixed hyperlipidemia Currently on Lipitor.   She denies myalgia or other side effects. Most recent lipids dated June 2023, independently reviewed as noted above. Recommend a goal LDL <70 mg/dL. Currently managed by primary care provider.   FINAL MEDICATION LIST END OF ENCOUNTER: Meds ordered this encounter  Medications   clopidogrel (PLAVIX) 75 MG tablet    Sig: Take 1 tablet (75 mg total) by mouth daily.    Dispense:  90 tablet    Refill:  0    Medications Discontinued During This Encounter   Medication Reason   clopidogrel (PLAVIX) 75 MG tablet Reorder     Current Outpatient Medications:    albuterol (VENTOLIN HFA) 108 (90 Base) MCG/ACT inhaler, Inhale 1-2 puffs into the lungs every 6 (six) hours as needed for wheezing or shortness of breath., Disp: , Rfl:    aspirin EC 81 MG tablet, Take 1 tablet (81 mg total) by mouth daily. Swallow whole., Disp: 30 tablet, Rfl: 11   atorvastatin (LIPITOR) 40 MG tablet, Take 1 tablet (40 mg total) by mouth at bedtime., Disp: 30 tablet, Rfl: 11   Dextromethorphan-guaiFENesin 10-100 MG/5ML liquid, Take 20 mLs by mouth 3 (three) times daily as needed for cough., Disp: , Rfl:    famotidine (PEPCID) 20 MG tablet, Take 1 tablet (20 mg total) by mouth 2 (two) times daily., Disp: 60 tablet, Rfl: 1   fluticasone (FLONASE) 50 MCG/ACT nasal spray, Place 1-2 sprays into both nostrils daily as needed (for seasonal allergies)., Disp: , Rfl:    loratadine (CLARITIN) 10 MG tablet, Take 10 mg by mouth daily as needed (for seasonal allergies)., Disp: , Rfl:    OVER THE COUNTER MEDICATION, Take 1 capsule by mouth daily. [place] Astaxanthin capsules- Take 1 capsule by mouth every morning, Disp: , Rfl:    OVER THE COUNTER MEDICATION, Take 1 capsule by mouth daily. AKelli HopeEssential-1 with Vitamin D3-2000 capsules- Take 1 capsule by mouth once a day after breakfast, Disp: , Rfl:  polyethylene glycol (MIRALAX / GLYCOLAX) 17 g packet, Take 17 g by mouth daily. (Patient taking differently: Take 17 g by mouth daily as needed for moderate constipation (with iron supplement).), Disp: 14 each, Rfl: 0   Vitamin D, Ergocalciferol, (DRISDOL) 1.25 MG (50000 UNIT) CAPS capsule, Take 50,000 Units by mouth 2 (two) times a week. Take 50,000 units by mouth every Wednesday and Saturday, Disp: , Rfl:    clopidogrel (PLAVIX) 75 MG tablet, Take 1 tablet (75 mg total) by mouth daily., Disp: 90 tablet, Rfl: 0  Orders Placed This Encounter   Procedures   DG Chest 2 View   CMP14+EGFR   Pro b natriuretic peptide (BNP)[facility])   CBC w/Diff/Platelet    There are no Patient Instructions on file for this visit.   --Continue cardiac medications as reconciled in final medication list. --Return in about 4 weeks (around 05/23/2022) for Follow up, Dyspnea. or sooner if needed. --Continue follow-up with your primary care physician regarding the management of your other chronic comorbid conditions.  Patient's questions and concerns were addressed to her satisfaction. She voices understanding of the instructions provided during this encounter.   This note was created using a voice recognition software as a result there may be grammatical errors inadvertently enclosed that do not reflect the nature of this encounter. Every attempt is made to correct such errors.  Rex Kras, [place], [facility]  Pager: [phone] Office: [phone]

## 2022-04-26 LAB — CBC WITH DIFFERENTIAL/PLATELET
Basophils Absolute: 0.1 10*3/uL (ref 0.0–0.2)
Basos: 1 %
EOS (ABSOLUTE): 1.2 10*3/uL — ABNORMAL HIGH (ref 0.0–0.4)
Eos: 12 %
Hematocrit: 38.5 % (ref 34.0–46.6)
Hemoglobin: 12.6 g/dL (ref 11.1–15.9)
Immature Grans (Abs): 0 10*3/uL (ref 0.0–0.1)
Immature Granulocytes: 0 %
Lymphocytes Absolute: 1.8 10*3/uL (ref 0.7–3.1)
Lymphs: 18 %
MCH: 28.3 pg (ref 26.6–33.0)
MCHC: 32.7 g/dL (ref 31.5–35.7)
MCV: 86 fL (ref 79–97)
Monocytes Absolute: 0.6 10*3/uL (ref 0.1–0.9)
Monocytes: 6 %
Neutrophils Absolute: 6.1 10*3/uL (ref 1.4–7.0)
Neutrophils: 63 %
Platelets: 475 10*3/uL — ABNORMAL HIGH (ref 150–450)
RBC: 4.46 x10E6/uL (ref 3.77–5.28)
RDW: 14.9 % (ref 11.7–15.4)
WBC: 9.7 10*3/uL (ref 3.4–10.8)

## 2022-04-26 LAB — CMP14+EGFR
ALT: 17 IU/L (ref 0–32)
AST: 20 IU/L (ref 0–40)
Albumin/Globulin Ratio: 2 (ref 1.2–2.2)
Albumin: 4.6 g/dL (ref 3.8–4.8)
Alkaline Phosphatase: 123 IU/L — ABNORMAL HIGH (ref 44–121)
BUN/Creatinine Ratio: 13 (ref 12–28)
BUN: 11 mg/dL (ref 8–27)
Bilirubin Total: 0.3 mg/dL (ref 0.0–1.2)
CO2: 24 mmol/L (ref 20–29)
Calcium: 9.7 mg/dL (ref 8.7–10.3)
Chloride: 102 mmol/L (ref 96–106)
Creatinine, Ser: 0.87 mg/dL (ref 0.57–1.00)
Globulin, Total: 2.3 g/dL (ref 1.5–4.5)
Glucose: 144 mg/dL — ABNORMAL HIGH (ref 70–99)
Potassium: 4.3 mmol/L (ref 3.5–5.2)
Sodium: 142 mmol/L (ref 134–144)
Total Protein: 6.9 g/dL (ref 6.0–8.5)
eGFR: 71 mL/min/{1.73_m2} (ref >59)

## 2022-04-26 LAB — PRO B NATRIURETIC PEPTIDE: NT-Pro BNP: 97 pg/mL (ref 0–301)

## 2022-04-26 NOTE — Progress Notes (Signed)
Called and spoke with patient regarding her recent lab results.

## 2022-04-28 ENCOUNTER — Telehealth: Payer: Self-pay

## 2022-04-28 NOTE — Telephone Encounter (Signed)
Spoke with patient advised her of results. She verbalized understanding.

## 2022-04-28 NOTE — Telephone Encounter (Signed)
-----   Message from [place], [place] sent at 04/27/2022  6:21 PM EST ----- Within normal limits per report.  Sunit Hinckley, DO, [facility]

## 2022-04-30 ENCOUNTER — Emergency Department (HOSPITAL_COMMUNITY)
Admission: EM | Admit: 2022-04-30 | Discharge: 2022-04-30 | Disposition: A | Discharge status: Home / Self Care | Payer: Medicare PPO | Attending: Emergency Medicine | Admitting: Emergency Medicine

## 2022-04-30 ENCOUNTER — Emergency Department (HOSPITAL_COMMUNITY): Payer: Medicare PPO

## 2022-04-30 DIAGNOSIS — Z7982 Long term (current) use of aspirin: Secondary | ICD-10-CM | POA: Diagnosis not present

## 2022-04-30 DIAGNOSIS — Z79899 Other long term (current) drug therapy: Secondary | ICD-10-CM | POA: Diagnosis not present

## 2022-04-30 DIAGNOSIS — K625 Hemorrhage of anus and rectum: Secondary | ICD-10-CM | POA: Diagnosis not present

## 2022-04-30 DIAGNOSIS — R059 Cough, unspecified: Secondary | ICD-10-CM | POA: Diagnosis not present

## 2022-04-30 DIAGNOSIS — R062 Wheezing: Secondary | ICD-10-CM | POA: Diagnosis not present

## 2022-04-30 DIAGNOSIS — K449 Diaphragmatic hernia without obstruction or gangrene: Secondary | ICD-10-CM | POA: Diagnosis not present

## 2022-04-30 LAB — CBC
HCT: 40.4 % (ref 36.0–46.0)
Hemoglobin: 12.8 g/dL (ref 12.0–15.0)
MCH: 28.4 pg (ref 26.0–34.0)
MCHC: 31.7 g/dL (ref 30.0–36.0)
MCV: 89.6 fL (ref 80.0–100.0)
Platelets: 470 10*3/uL — ABNORMAL HIGH (ref 150–400)
RBC: 4.51 MIL/uL (ref 3.87–5.11)
RDW: 14.6 % (ref 11.5–15.5)
WBC: 9.7 10*3/uL (ref 4.0–10.5)
nRBC: 0 % (ref 0.0–0.2)

## 2022-04-30 LAB — COMPREHENSIVE METABOLIC PANEL
ALT: 17 U/L (ref 0–44)
AST: 21 U/L (ref 15–41)
Albumin: 4 g/dL (ref 3.5–5.0)
Alkaline Phosphatase: 93 U/L (ref 38–126)
Anion gap: 11 (ref 5–15)
BUN: 13 mg/dL (ref 8–23)
CO2: 23 mmol/L (ref 22–32)
Calcium: 9.3 mg/dL (ref 8.9–10.3)
Chloride: 104 mmol/L (ref 98–111)
Creatinine, Ser: 0.76 mg/dL (ref 0.44–1.00)
GFR, Estimated: >60 mL/min (ref >60)
Glucose, Bld: 109 mg/dL — ABNORMAL HIGH (ref 70–99)
Potassium: 4.3 mmol/L (ref 3.5–5.1)
Sodium: 138 mmol/L (ref 135–145)
Total Bilirubin: 0.5 mg/dL (ref 0.3–1.2)
Total Protein: 7.4 g/dL (ref 6.5–8.1)

## 2022-04-30 LAB — TYPE AND SCREEN
ABO/RH(D): AB POS
Antibody Screen: NEGATIVE

## 2022-04-30 NOTE — ED Provider Notes (Signed)
[place] EMERGENCY DEPARTMENT Provider Note   CSN: [id number] Arrival date & time: 04/30/22  1232     History {Add pertinent medical, surgical, social history, OB history to HPI:1} Chief Complaint  Patient presents with   Rectal Bleeding    [name] [name] is a 71 y.o. female.  She is here with a complaint of an episode of bright red blood per rectum earlier today while having a bowel movement.  She said she passed a little bit of stool followed by some bright red blood and then a little pink.  She did not see any clots.  It was not associated with any abdominal pain.  She had 1 prior episode of GI bleed earlier this year and required transfusion although no clear source was identified.  She follow-up with Dr. Lu Duffel God from [place].  She recently had a PFO repair done last month and is on dual platelet therapy with Plavix and aspirin.  She did say she felt a little bit of dizziness earlier today and some fatigue but that seems to have passed.  She has not noticed any other bleeding.  She has had some ongoing wheezing since the spring and continues.  The history is provided by the patient.  Rectal Bleeding Quality:  Bright red Amount:  Moderate Chronicity:  Recurrent Context: defecation   Similar prior episodes: yes   Relieved by:  None tried Worsened by:  Nothing Ineffective treatments:  None tried Associated symptoms: dizziness   Associated symptoms: no abdominal pain, no epistaxis, no fever, no hematemesis, no loss of consciousness and no vomiting   Risk factors: anticoagulant use        Home Medications Prior to Admission medications   Medication Sig Start Date End Date Taking? Authorizing Provider  albuterol (VENTOLIN HFA) 108 (90 Base) MCG/ACT inhaler Inhale 1-2 puffs into the lungs every 6 (six) hours as needed for wheezing or shortness of breath. 12/27/21   [provider]  aspirin EC 81 MG tablet Take 1 tablet (81 mg total) by mouth daily.  Swallow whole. 11/17/21 11/12/22  Elodia Florence., MD  atorvastatin (LIPITOR) 40 MG tablet Take 1 tablet (40 mg total) by mouth at bedtime. 11/16/21 11/11/22  Elodia Florence., MD  clopidogrel (PLAVIX) 75 MG tablet Take 1 tablet (75 mg total) by mouth daily. 04/25/22 07/24/22  Tolia, Sunit, DO  Dextromethorphan-guaiFENesin 10-100 MG/5ML liquid Take 20 mLs by mouth 3 (three) times daily as needed for cough.    [provider]  famotidine (PEPCID) 20 MG tablet Take 1 tablet (20 mg total) by mouth 2 (two) times daily. 04/07/22   Adrian Prows, MD  fluticasone (FLONASE) 50 MCG/ACT nasal spray Place 1-2 sprays into both nostrils daily as needed (for seasonal allergies).    [provider]  loratadine (CLARITIN) 10 MG tablet Take 10 mg by mouth daily as needed (for seasonal allergies).    [provider]  OVER THE COUNTER MEDICATION Take 1 capsule by mouth daily. [place] with Astaxanthin capsules- Take 1 capsule by mouth every morning    [provider]  OVER THE COUNTER MEDICATION Take 1 capsule by mouth daily. Kelli Hope Essential-1 with Vitamin D3-2000 capsules- Take 1 capsule by mouth once a day after breakfast    [provider]  polyethylene glycol (MIRALAX / GLYCOLAX) 17 g packet Take 17 g by mouth daily. Patient taking differently: Take 17 g by mouth daily as needed for moderate  constipation (with iron supplement). 10/30/21   Florencia Reasons, MD  Vitamin D, Ergocalciferol, (DRISDOL) 1.25 MG (50000 UNIT) CAPS capsule Take 50,000 Units by mouth 2 (two) times a week. Take 50,000 units by mouth every Wednesday and Saturday 11/07/21   [provider]      Allergies    Patient has no known allergies.    Review of Systems   Review of Systems  Constitutional:  Positive for fatigue. Negative for fever.  HENT:  Negative for nosebleeds.   Respiratory:  Positive for wheezing.   Cardiovascular:  Negative for chest pain.   Gastrointestinal:  Positive for hematochezia. Negative for abdominal pain, hematemesis, nausea and vomiting.  Neurological:  Positive for dizziness. Negative for loss of consciousness.    Physical Exam Updated Vital Signs BP (!) 144/89 (BP Location: Right Arm)   Pulse 84   Temp 97.9 F (36.6 C) (Oral)   Resp 17   SpO2 93%  Physical Exam Vitals and nursing note reviewed.  Constitutional:      General: She is not in acute distress.    Appearance: Normal appearance. She is well-developed.  HENT:     Head: Normocephalic and atraumatic.  Eyes:     Conjunctiva/sclera: Conjunctivae normal.  Cardiovascular:     Rate and Rhythm: Normal rate and regular rhythm.     Heart sounds: No murmur heard. Pulmonary:     Effort: Pulmonary effort is normal. No respiratory distress.     Breath sounds: Normal breath sounds.  Abdominal:     Palpations: Abdomen is soft.     Tenderness: There is no abdominal tenderness. There is no guarding or rebound.  Musculoskeletal:     Cervical back: Neck supple.     Right lower leg: No edema.     Left lower leg: No edema.  Skin:    General: Skin is warm and dry.     Capillary Refill: Capillary refill takes less than 2 seconds.  Neurological:     General: No focal deficit present.     Mental Status: She is alert.     ED Results / Procedures / Treatments   Labs (all labs ordered are listed, but only abnormal results are displayed) Labs Reviewed  COMPREHENSIVE METABOLIC PANEL - Abnormal; Notable for the following components:      Result Value   Glucose, Bld 109 (*)    All other components within normal limits  CBC - Abnormal; Notable for the following components:   Platelets 470 (*)    All other components within normal limits  POC OCCULT BLOOD, ED  TYPE AND SCREEN    EKG None  Radiology No results found.  Procedures Procedures  {Document cardiac monitor, telemetry assessment procedure when appropriate:1}  Medications Ordered in  ED Medications - No data to display  ED Course/ Medical Decision Making/ A&P                           Medical Decision Making Amount and/or Complexity of Data Reviewed Labs: ordered.   This patient complains of ***; this involves an extensive number of treatment Options and is a complaint that carries with it a high risk of complications and morbidity. The differential includes ***  I ordered, reviewed and interpreted labs, which included *** I ordered medication *** and reviewed PMP when indicated. I ordered imaging studies which included *** and I independently    visualized and interpreted imaging which showed *** Additional history obtained  from *** Previous records obtained and reviewed *** I consulted *** and discussed lab and imaging findings and discussed disposition.  Cardiac monitoring reviewed, *** Social determinants considered, *** Critical Interventions: ***  After the interventions stated above, I reevaluated the patient and found *** Admission and further testing considered, ***   {Document critical care time when appropriate:1} {Document review of labs and clinical decision tools ie heart score, Chads2Vasc2 etc:1}  {Document your independent review of radiology images, and any outside records:1} {Document your discussion with family members, caretakers, and with consultants:1} {Document social determinants of health affecting pt's care:1} {Document your decision making why or why not admission, treatments were needed:1} Final Clinical Impression(s) / ED Diagnoses Final diagnoses:  None    Rx / DC Orders ED Discharge Orders     None

## 2022-04-30 NOTE — ED Triage Notes (Signed)
Patient here for evaluation of rectal bleeding that started this morning, history same with in June of this year. Was placed on plavix three weeks ago after a procedure. Patient is alert, oriented, and in no apparent distress at this time.

## 2022-04-30 NOTE — ED Provider Triage Note (Signed)
Emergency Medicine Provider Triage Evaluation Note  [name] [name] , a 71 y.o. female  was evaluated in triage.  Pt complains of an episode of bright red blood per rectum earlier today.  History of GI bleed.  Placed on Plavix  and ASA '81mg'$  3 weeks ago.  Patient notes she had hemorrhoids during her last admission for GI bleed however, does not feel she still has them.   Review of Systems  Positive: Rectal bleeding Negative: fever  Physical Exam  BP (!) 189/97 (BP Location: Right Arm)   Pulse (!) 102   Temp 98.8 F (37.1 C) (Oral)   Resp (!) 24   SpO2 94%  Gen:   Awake, no distress   Resp:  Normal effort  MSK:   Moves extremities without difficulty  Other:    Medical Decision Making  Medically screening exam initiated at 1:04 PM.  Appropriate orders placed.  [name] [name] was informed that the remainder of the evaluation will be completed by another provider, this initial triage assessment does not replace that evaluation, and the importance of remaining in the ED until their evaluation is complete.  labs   Suzy Bouchard, [place] 04/30/22 1306

## 2022-04-30 NOTE — Discharge Instructions (Signed)
You were seen in the emergency department for an episode of rectal bleeding.  Your red blood cell count was stable.  Your case was reviewed with [facility] and they felt you are appropriate to follow-up in the office this week.  Please return to the emergency department if any significant bleeding or other concerning symptoms.

## 2022-05-04 ENCOUNTER — Encounter: Payer: Medicare PPO | Admitting: Adult Health

## 2022-05-06 ENCOUNTER — Emergency Department (HOSPITAL_COMMUNITY): Payer: Medicare PPO

## 2022-05-06 ENCOUNTER — Encounter (HOSPITAL_COMMUNITY): Payer: Self-pay | Admitting: Emergency Medicine

## 2022-05-06 ENCOUNTER — Other Ambulatory Visit: Payer: Self-pay

## 2022-05-06 ENCOUNTER — Emergency Department (HOSPITAL_COMMUNITY)
Admission: EM | Admit: 2022-05-06 | Discharge: 2022-05-06 | Disposition: A | Discharge status: Home / Self Care | Payer: Medicare PPO | Attending: Emergency Medicine | Admitting: Emergency Medicine

## 2022-05-06 DIAGNOSIS — I10 Essential (primary) hypertension: Secondary | ICD-10-CM | POA: Diagnosis not present

## 2022-05-06 DIAGNOSIS — Z1152 Encounter for screening for COVID-19: Secondary | ICD-10-CM | POA: Insufficient documentation

## 2022-05-06 DIAGNOSIS — R0609 Other forms of dyspnea: Secondary | ICD-10-CM | POA: Insufficient documentation

## 2022-05-06 DIAGNOSIS — R0602 Shortness of breath: Secondary | ICD-10-CM | POA: Diagnosis not present

## 2022-05-06 DIAGNOSIS — J449 Chronic obstructive pulmonary disease, unspecified: Secondary | ICD-10-CM | POA: Diagnosis not present

## 2022-05-06 DIAGNOSIS — K449 Diaphragmatic hernia without obstruction or gangrene: Secondary | ICD-10-CM | POA: Diagnosis not present

## 2022-05-06 LAB — CBC WITH DIFFERENTIAL/PLATELET
Abs Immature Granulocytes: 0.01 10*3/uL (ref 0.00–0.07)
Basophils Absolute: 0.1 10*3/uL (ref 0.0–0.1)
Basophils Relative: 1 %
Eosinophils Absolute: 0.7 10*3/uL — ABNORMAL HIGH (ref 0.0–0.5)
Eosinophils Relative: 7 %
HCT: 39 % (ref 36.0–46.0)
Hemoglobin: 12.8 g/dL (ref 12.0–15.0)
Immature Granulocytes: 0 %
Lymphocytes Relative: 17 %
Lymphs Abs: 1.7 10*3/uL (ref 0.7–4.0)
MCH: 28.8 pg (ref 26.0–34.0)
MCHC: 32.8 g/dL (ref 30.0–36.0)
MCV: 87.8 fL (ref 80.0–100.0)
Monocytes Absolute: 0.5 10*3/uL (ref 0.1–1.0)
Monocytes Relative: 5 %
Neutro Abs: 6.7 10*3/uL (ref 1.7–7.7)
Neutrophils Relative %: 70 %
Platelets: 421 10*3/uL — ABNORMAL HIGH (ref 150–400)
RBC: 4.44 MIL/uL (ref 3.87–5.11)
RDW: 14.6 % (ref 11.5–15.5)
WBC: 9.6 10*3/uL (ref 4.0–10.5)
nRBC: 0 % (ref 0.0–0.2)

## 2022-05-06 LAB — RESP PANEL BY RT-PCR (RSV, FLU A&B, COVID)  RVPGX2
Influenza A by PCR: NEGATIVE
Influenza B by PCR: NEGATIVE
Resp Syncytial Virus by PCR: NEGATIVE
SARS Coronavirus 2 by RT PCR: NEGATIVE

## 2022-05-06 LAB — LIPASE, BLOOD: Lipase: 50 U/L (ref 11–51)

## 2022-05-06 LAB — COMPREHENSIVE METABOLIC PANEL
ALT: 19 U/L (ref 0–44)
AST: 28 U/L (ref 15–41)
Albumin: 4.2 g/dL (ref 3.5–5.0)
Alkaline Phosphatase: 88 U/L (ref 38–126)
Anion gap: 10 (ref 5–15)
BUN: 8 mg/dL (ref 8–23)
CO2: 23 mmol/L (ref 22–32)
Calcium: 9.5 mg/dL (ref 8.9–10.3)
Chloride: 105 mmol/L (ref 98–111)
Creatinine, Ser: 0.79 mg/dL (ref 0.44–1.00)
GFR, Estimated: >60 mL/min (ref >60)
Glucose, Bld: 109 mg/dL — ABNORMAL HIGH (ref 70–99)
Potassium: 4 mmol/L (ref 3.5–5.1)
Sodium: 138 mmol/L (ref 135–145)
Total Bilirubin: 0.4 mg/dL (ref 0.3–1.2)
Total Protein: 7.6 g/dL (ref 6.5–8.1)

## 2022-05-06 LAB — TROPONIN I (HIGH SENSITIVITY)
Troponin I (High Sensitivity): 3 ng/L (ref <18)
Troponin I (High Sensitivity): 3 ng/L (ref <18)

## 2022-05-06 LAB — BRAIN NATRIURETIC PEPTIDE: B Natriuretic Peptide: 18.6 pg/mL (ref 0.0–100.0)

## 2022-05-06 MED ORDER — DOXYCYCLINE HYCLATE 100 MG PO CAPS: 100.0000 mg | ORAL_CAPSULE | Freq: Two times a day (BID) | ORAL | 0 refills | Status: DC

## 2022-05-06 MED ORDER — ALBUTEROL SULFATE HFA 108 (90 BASE) MCG/ACT IN AERS
1.0000 | INHALATION_SPRAY | Freq: Once | RESPIRATORY_TRACT | Status: AC
  Administered 2022-05-06: 2 via RESPIRATORY_TRACT
  Filled 2022-05-06: qty 6.7

## 2022-05-06 MED ORDER — DOXYCYCLINE HYCLATE 100 MG PO TABS
100.0000 mg | ORAL_TABLET | Freq: Once | ORAL | Status: AC
  Administered 2022-05-06: 100 mg via ORAL
  Filled 2022-05-06: qty 1

## 2022-05-06 MED ORDER — LACTATED RINGERS IV BOLUS
1000.0000 mL | Freq: Once | INTRAVENOUS | Status: AC
  Administered 2022-05-06: 1000 mL via INTRAVENOUS

## 2022-05-06 MED ORDER — IOHEXOL 350 MG/ML SOLN
75.0000 mL | Freq: Once | INTRAVENOUS | Status: AC | PRN
  Administered 2022-05-06: 75 mL via INTRAVENOUS

## 2022-05-06 NOTE — ED Provider Notes (Signed)
[facility] EMERGENCY DEPARTMENT Provider Note   CSN: [id number] Arrival date & time: 05/06/22  1140     History Chief Complaint  Patient presents with   Shortness of Breath    HPI [name] [name] is a 71 y.o. female presenting for SOB. She endorses a 1 month history of progressive DOE  [facility]: ASD closure one month ago Ccx SOB TIA Progressive DOE  Patient's recorded medical, surgical, social, medication list and allergies were reviewed in the Snapshot window as part of the initial history.   Review of Systems   Review of Systems  Constitutional:  Negative for chills and fever.  HENT:  Negative for ear pain and sore throat.   Eyes:  Negative for pain and visual disturbance.  Respiratory:  Positive for shortness of breath. Negative for cough.   Cardiovascular:  Negative for chest pain and palpitations.  Gastrointestinal:  Negative for abdominal pain and vomiting.  Genitourinary:  Negative for dysuria and hematuria.  Musculoskeletal:  Negative for arthralgias and back pain.  Skin:  Negative for color change and rash.  Neurological:  Negative for seizures and syncope.  All other systems reviewed and are negative.   Physical Exam Updated Vital Signs BP (!) 164/84   Pulse 85   Temp 98.2 F (36.8 C) (Oral)   Resp 17   Ht 5' 4.5" (1.638 m)   Wt 81.6 kg   SpO2 95%   BMI 30.42 kg/m  Physical Exam Vitals and nursing note reviewed.  Constitutional:      General: She is not in acute distress.    Appearance: She is well-developed.  HENT:     Head: Normocephalic and atraumatic.  Eyes:     Conjunctiva/sclera: Conjunctivae normal.  Cardiovascular:     Rate and Rhythm: Normal rate and regular rhythm.     Heart sounds: No murmur heard. Pulmonary:     Effort: Pulmonary effort is normal. No respiratory distress.     Breath sounds: Normal breath sounds.  Abdominal:     General: There is no distension.     Palpations: Abdomen is soft.     Tenderness:  There is no abdominal tenderness. There is no right CVA tenderness or left CVA tenderness.  Musculoskeletal:        General: No swelling or tenderness. Normal range of motion.     Cervical back: Neck supple.  Skin:    General: Skin is warm and dry.  Neurological:     General: No focal deficit present.     Mental Status: She is alert and oriented to person, place, and time. Mental status is at baseline.     Cranial Nerves: No cranial nerve deficit.      ED Course/ Medical Decision Making/ A&P    Procedures Procedures   Medications Ordered in ED Medications  albuterol (VENTOLIN HFA) 108 (90 Base) MCG/ACT inhaler 1-2 puff (2 puffs Inhalation Given 05/06/22 1315)  iohexol (OMNIPAQUE) 350 MG/ML injection 75 mL (75 mLs Intravenous Contrast Given 05/06/22 1349)  doxycycline (VIBRA-TABS) tablet 100 mg (100 mg Oral Given 05/06/22 1457)  lactated ringers bolus 1,000 mL (1,000 mLs Intravenous New Bag/Given 05/06/22 1457)    Medical Decision Making:    [name] [name] is a 71 y.o. female who presented to the ED today with DOE/SOB detailed above.     Patient's presentation is complicated by their history of multiple comorbid medical problems.  Patient placed on continuous vitals and telemetry monitoring while in ED which was reviewed  periodically.   Complete initial physical exam performed, notably the patient  was HDS in NAD. Has right sided calf tenderness      Reviewed and confirmed nursing documentation for past medical history, family history, social history.    Initial Assessment:   With the patient's presentation of SOB, most likely diagnosis is nonspecific etiology. Other diagnoses were considered including (but not limited to) CAP, CHF, ACS, PTX, PE. These are considered less likely due to history of present illness and physical exam findings.   This is most consistent with an acute life/limb threatening illness complicated by underlying chronic conditions.  Initial Plan:   Wells Moderate: CTAPE  Screening labs including CBC and Metabolic panel to evaluate for infectious or metabolic etiology of disease.  Urinalysis with reflex culture ordered to evaluate for UTI or relevant urologic/nephrologic pathology.  CXR to evaluate for structural/infectious intrathoracic pathology.  Troponin/BNP/EKG to evaluate for cardiac pathology. Objective evaluation as below reviewed with plan for close reassessment  Initial Study Results:   Laboratory  All laboratory results reviewed without evidence of clinically relevant pathology.    EKG EKG was reviewed independently. Rate, rhythm, axis, intervals all examined and without medically relevant abnormality. ST segments without concerns for elevations.    Radiology  All images reviewed independently. Agree with radiology report at this time.   CT Angio Chest Pulmonary Embolism (PE) W or WO Contrast  Result Date: 05/06/2022 CLINICAL DATA:  Shortness of breath. Elevated D-dimer. Clinical suspicion for pulmonary embolism. EXAM: CT ANGIOGRAPHY CHEST WITH CONTRAST TECHNIQUE: Multidetector CT imaging of the chest was performed using the standard protocol during bolus administration of intravenous contrast. Multiplanar CT image reconstructions and MIPs were obtained to evaluate the vascular anatomy. RADIATION DOSE REDUCTION: This exam was performed according to the departmental dose-optimization program which includes automated exposure control, adjustment of the mA and/or kV according to patient size and/or use of iterative reconstruction technique. CONTRAST:  43m OMNIPAQUE IOHEXOL 350 MG/ML SOLN COMPARISON:  None Available. FINDINGS: Cardiovascular: Satisfactory opacification of pulmonary arteries noted, and no pulmonary emboli identified. No evidence of thoracic aortic dissection or aneurysm. Aortic and coronary atherosclerotic calcification incidentally noted. Mediastinum/Nodes: No masses or pathologically enlarged lymph nodes  identified. Lungs/Pleura: Poorly defined areas of airspace and ground-glass opacity are seen in both upper lobes in the superior segment of the right lower lobe. No evidence of mass or pleural effusion. Upper abdomen: Large hiatal hernia is seen. Musculoskeletal: No suspicious bone lesions identified. Review of the MIP images confirms the above findings. IMPRESSION: No evidence of pulmonary embolism. Poorly defined areas of airspace and ground-glass opacity in both upper lobes and superior segment of right lower lobe. Differential diagnosis includes pneumonia, aspiration, and inflammatory etiologies. Recommend continued follow-up by CT in 1-2 months. Large hiatal hernia. Aortic Atherosclerosis (ICD10-I70.0). Electronically Signed   By: JMarlaine HindM.D.   On: 05/06/2022 14:16   DG Chest Portable 1 View  Result Date: 05/06/2022 CLINICAL DATA:  Shortness of breath. EXAM: PORTABLE CHEST 1 VIEW COMPARISON:  One-view chest x-ray 04/30/2022 FINDINGS: Heart size is normal. Hilar hernia again noted. Lungs are clear. No edema or effusion is present. No focal airspace disease present. Changes of COPD again noted. IMPRESSION: 1. No acute cardiopulmonary disease. 2. COPD. Electronically Signed   By: CSan MorelleM.D.   On: 05/06/2022 12:30     Final Assessment and Plan:   Observe patient in the emergency department for 3 and half hours.  She has no fevers, shortness of breath at  this time.  She endorses some lightheadedness and intermittent dizziness.  CT scan with diffuse opacities more consistent with atypical PNA. Will treat with doxycycline and manage in the OP setting due to reassuring CURB 65 score.  She was observed with normal oxygen saturations for 3 and half hours, overall well-appearing.  Given reassuring objective evaluation will initiate treatment in ED and recommend close reassessment by PCP.  Clinical Impression:  1. SOB (shortness of breath)      Discharge   Final Clinical Impression(s)  / ED Diagnoses Final diagnoses:  SOB (shortness of breath)    Rx / DC Orders ED Discharge Orders          Ordered    doxycycline (VIBRAMYCIN) 100 MG capsule  2 times daily        05/06/22 1521              Tretha Sciara, MD 05/06/22 [phone]

## 2022-05-06 NOTE — ED Triage Notes (Signed)
Pt BIB EMS from home with ongoing respiratory issues since cardiac procedure in November. Pt reports wheezing yesterday and increased shortness of breath today. Denies chest pain and dizziness.   EMS Vitals 156/102 HR 80 ETCO2 29 20-22 RR Lungs clear and equal

## 2022-05-10 DIAGNOSIS — R051 Acute cough: Secondary | ICD-10-CM | POA: Diagnosis not present

## 2022-05-10 DIAGNOSIS — Z6830 Body mass index (BMI) 30.0-30.9, adult: Secondary | ICD-10-CM | POA: Diagnosis not present

## 2022-05-10 DIAGNOSIS — E782 Mixed hyperlipidemia: Secondary | ICD-10-CM | POA: Diagnosis not present

## 2022-05-10 DIAGNOSIS — E559 Vitamin D deficiency, unspecified: Secondary | ICD-10-CM | POA: Diagnosis not present

## 2022-05-10 DIAGNOSIS — Z8774 Personal history of (corrected) congenital malformations of heart and circulatory system: Secondary | ICD-10-CM | POA: Diagnosis not present

## 2022-05-10 DIAGNOSIS — R0601 Orthopnea: Secondary | ICD-10-CM | POA: Diagnosis not present

## 2022-05-10 DIAGNOSIS — D509 Iron deficiency anemia, unspecified: Secondary | ICD-10-CM | POA: Diagnosis not present

## 2022-05-10 DIAGNOSIS — R062 Wheezing: Secondary | ICD-10-CM | POA: Diagnosis not present

## 2022-05-11 ENCOUNTER — Emergency Department (HOSPITAL_COMMUNITY): Payer: Medicare PPO

## 2022-05-11 ENCOUNTER — Other Ambulatory Visit: Payer: Self-pay

## 2022-05-11 ENCOUNTER — Emergency Department (HOSPITAL_COMMUNITY)
Admission: EM | Admit: 2022-05-11 | Discharge: 2022-05-11 | Disposition: A | Discharge status: Home / Self Care | Payer: Medicare PPO | Attending: Emergency Medicine | Admitting: Emergency Medicine

## 2022-05-11 ENCOUNTER — Encounter (HOSPITAL_COMMUNITY): Payer: Self-pay

## 2022-05-11 ENCOUNTER — Other Ambulatory Visit (HOSPITAL_COMMUNITY): Payer: Self-pay | Admitting: Family Medicine

## 2022-05-11 DIAGNOSIS — R062 Wheezing: Secondary | ICD-10-CM

## 2022-05-11 DIAGNOSIS — M7989 Other specified soft tissue disorders: Secondary | ICD-10-CM | POA: Insufficient documentation

## 2022-05-11 DIAGNOSIS — K449 Diaphragmatic hernia without obstruction or gangrene: Secondary | ICD-10-CM | POA: Diagnosis not present

## 2022-05-11 DIAGNOSIS — Z859 Personal history of malignant neoplasm, unspecified: Secondary | ICD-10-CM | POA: Diagnosis not present

## 2022-05-11 DIAGNOSIS — Z7982 Long term (current) use of aspirin: Secondary | ICD-10-CM | POA: Diagnosis not present

## 2022-05-11 DIAGNOSIS — R0600 Dyspnea, unspecified: Secondary | ICD-10-CM

## 2022-05-11 DIAGNOSIS — R0602 Shortness of breath: Secondary | ICD-10-CM | POA: Insufficient documentation

## 2022-05-11 DIAGNOSIS — I10 Essential (primary) hypertension: Secondary | ICD-10-CM | POA: Diagnosis not present

## 2022-05-11 DIAGNOSIS — Z20822 Contact with and (suspected) exposure to covid-19: Secondary | ICD-10-CM | POA: Insufficient documentation

## 2022-05-11 LAB — CBC
HCT: 37 % (ref 36.0–46.0)
Hemoglobin: 12.2 g/dL (ref 12.0–15.0)
MCH: 28.5 pg (ref 26.0–34.0)
MCHC: 33 g/dL (ref 30.0–36.0)
MCV: 86.4 fL (ref 80.0–100.0)
Platelets: 418 10*3/uL — ABNORMAL HIGH (ref 150–400)
RBC: 4.28 MIL/uL (ref 3.87–5.11)
RDW: 14.6 % (ref 11.5–15.5)
WBC: 7.7 10*3/uL (ref 4.0–10.5)
nRBC: 0 % (ref 0.0–0.2)

## 2022-05-11 LAB — BASIC METABOLIC PANEL
Anion gap: 8 (ref 5–15)
BUN: 11 mg/dL (ref 8–23)
CO2: 21 mmol/L — ABNORMAL LOW (ref 22–32)
Calcium: 9.4 mg/dL (ref 8.9–10.3)
Chloride: 106 mmol/L (ref 98–111)
Creatinine, Ser: 0.73 mg/dL (ref 0.44–1.00)
GFR, Estimated: >60 mL/min (ref >60)
Glucose, Bld: 130 mg/dL — ABNORMAL HIGH (ref 70–99)
Potassium: 4.5 mmol/L (ref 3.5–5.1)
Sodium: 135 mmol/L (ref 135–145)

## 2022-05-11 LAB — I-STAT VENOUS BLOOD GAS, ED
Acid-Base Excess: 0 mmol/L (ref 0.0–2.0)
Bicarbonate: 24.2 mmol/L (ref 20.0–28.0)
Calcium, Ion: 1.16 mmol/L (ref 1.15–1.40)
HCT: 38 % (ref 36.0–46.0)
Hemoglobin: 12.9 g/dL (ref 12.0–15.0)
O2 Saturation: 97 %
Potassium: 4.5 mmol/L (ref 3.5–5.1)
Sodium: 139 mmol/L (ref 135–145)
TCO2: 25 mmol/L (ref 22–32)
pCO2, Ven: 35.4 mmHg — ABNORMAL LOW (ref 44–60)
pH, Ven: 7.444 — ABNORMAL HIGH (ref 7.25–7.43)
pO2, Ven: 89 mmHg — ABNORMAL HIGH (ref 32–45)

## 2022-05-11 LAB — RESP PANEL BY RT-PCR (RSV, FLU A&B, COVID)  RVPGX2
Influenza A by PCR: NEGATIVE
Influenza B by PCR: NEGATIVE
Resp Syncytial Virus by PCR: NEGATIVE
SARS Coronavirus 2 by RT PCR: NEGATIVE

## 2022-05-11 LAB — TROPONIN I (HIGH SENSITIVITY)
Troponin I (High Sensitivity): <2 ng/L (ref <18)
Troponin I (High Sensitivity): <2 ng/L (ref <18)

## 2022-05-11 MED ORDER — BENZONATATE 100 MG PO CAPS: 100.0000 mg | ORAL_CAPSULE | Freq: Three times a day (TID) | ORAL | 0 refills | Status: DC

## 2022-05-11 MED ORDER — IPRATROPIUM-ALBUTEROL 0.5-2.5 (3) MG/3ML IN SOLN
3.0000 mL | Freq: Once | RESPIRATORY_TRACT | Status: AC
  Administered 2022-05-11: 3 mL via RESPIRATORY_TRACT
  Filled 2022-05-11: qty 3

## 2022-05-11 MED ORDER — OXYMETAZOLINE HCL 0.05 % NA SOLN: 1.0000 | Freq: Two times a day (BID) | NASAL | 0 refills | Status: DC

## 2022-05-11 NOTE — ED Provider Notes (Signed)
  Physical Exam  BP (!) 161/80   Pulse 98   Temp 98.3 F (36.8 C) (Oral)   Resp 14   SpO2 97%   Physical Exam  Procedures  Procedures  ED Course / MDM   Clinical Course as of 05/12/22 1351  Thu May 11, 2022  1609 Assumed care from Dr Truett Mainland.  71 year old female who had PFO repair in November and has had SOB since. Had thorough workup several days ago and was placed on doxy.  Not hypoxic which would be expected if her PFO closure had failed.  Has already had a CT scan that did not show evidence of pericardial effusion.  Has not been anemic.  Says shortness of breath and cough and anxiety are worsening. Has faint wheezing on exam and was given neb. No hx of copd or asthma.  No history of smoking.  Awaiting labs and walking pulse ox.  [RP]  1922 Walking pulse ox with nadir of 92% but typically at 94%. [RP]  2000 Patient's COVID and flu returned and was WNL.  Repeat troponin WNL.  Had extensive discussion with the patient regarding her symptoms.  Went over her workup in the emergency department on 12/16 and again her reassuring evaluation today.  Appears that she is very concerned about her cough which has been persistent as well as having a significant amount of anxiety around her shortness of breath which has now been present for over a month.  Did reassure the patient that with her recent CT scan it does not appear that she has a pulmonary embolism and her oxygen levels appear normal today.  Has had several workups for cardiac causes of her shortness of breath which have been reassuring and she has already seen her cardiologist regarding this.  Chest x-ray today did not show new infiltrate concerning for pneumonia.  Does have faint wheezing of unclear etiology since she does not have COPD or asthma and had negative RSV.  Also no history of smoking.  Feel that the neck step would be to have the patient see pulmonology as an outpatient and given her reassuring evaluation today do not feel that she  warrants admission at this time.  She reported some congestion so we will give her prescription of Afrin as well as Tessalon Perles for her cough.  Instructed her to follow-up with her primary doctor and cardiologist as well in the next few days.  Return precautions discussed prior to discharge. [RP]    Clinical Course User Index [RP] Fransico Meadow, MD   Medical Decision Making Amount and/or Complexity of Data Reviewed Labs: ordered. Radiology: ordered.  Risk OTC drugs. Prescription drug management.     Fransico Meadow, MD 05/12/22 1352

## 2022-05-11 NOTE — ED Notes (Signed)
Pt's walking sa02 was 92% at the lowest. Pt had to stop twice to catch her breath.

## 2022-05-11 NOTE — Discharge Instructions (Addendum)
You were seen for shortness of breath in the emergency department.   At home, please continue the medications that you are previously prescribed.  Please take Afrin for congestion and Tessalon Perles for cough.    Follow-up with your primary doctor in 2-3 days regarding your visit.  Follow-up with pulmonology and cardiology as soon as possible.  Return immediately to the emergency department if you experience any of the following: Worsening shortness of breath, chest discomfort or pain, or any other concerning symptoms.    Thank you for visiting our Emergency Department. It was a pleasure taking care of you today.

## 2022-05-11 NOTE — ED Triage Notes (Signed)
Pt arrived via [facility] from home for increased SOB. Pt had procedure on heart on 04/07/22 and has had increased SOB since then. Pt stated SOB has been getting progressively worse.  Pt states the SOB gets even worse w/exertion and she gets lightheaded. Pt arrived on 3L 02 per [place]. Pt is tachypneic and has labored breathing. Pt's other VSS.

## 2022-05-11 NOTE — ED Provider Notes (Signed)
[facility] EMERGENCY DEPARTMENT Provider Note  CSN: [id number] Arrival date & time: 05/11/22 1412  Chief Complaint(s) Shortness of Breath  HPI [name] [name] is a 71 y.o. female with history of stroke, hyperlipidemia presenting to the emergency department shortness of breath.  Patient reports shortness of breath for the past 3 weeks.  She reports that it is worsened in the past few days.  She reports that she has had a dry cough which is actually improved.  She reports it is worse with any exertion.  She denies chest pain.  She reports mild leg swelling bilaterally which is chronic and not significantly different.  She denies any fevers or chills.  She denies any back pain or abdominal pain.  Denies any dysuria.  When she was in the emergency department previously she had been diagnosed with "walking pneumonia ".  She reports since leaving the emergency department she followed up with a physician who thought her symptoms were more likely cardiac.   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   History of cardioembolic cerebrovascular accident (CVA) 04/07/2022   Hx of atrial septal defect repair 04/07/2022   Folate deficiency 11/16/2021   History of GI diverticular bleed 11/16/2021   TIA (transient ischemic attack) 11/15/2021   Iron deficiency anemia 11/15/2021   Acute lower GI bleeding 10/25/2021   GERD (gastroesophageal reflux disease) 10/25/2021   Diverticulosis of intestine with bleeding 10/25/2021   Acute blood loss anemia 10/25/2021   Hyperlipidemia 10/25/2021    Aortic atherosclerosis ([place]) 10/25/2021   Home Medication(s) Prior to Admission medications   Medication Sig Start Date End Date Taking? Authorizing Provider  albuterol (VENTOLIN HFA) 108 (90 Base) MCG/ACT inhaler Inhale 1-2 puffs into the lungs every 6 (six) hours as needed for wheezing or shortness of breath. 12/27/21   [provider]  aspirin EC 81 MG tablet Take 1 tablet (81 mg total) by mouth daily. Swallow whole. 11/17/21 11/12/22  Elodia Florence., MD  atorvastatin (LIPITOR) 40 MG tablet Take 1 tablet (40 mg total) by mouth at bedtime. 11/16/21 11/11/22  Elodia Florence., MD  clopidogrel (PLAVIX) 75 MG tablet Take 1 tablet (75 mg total) by mouth daily. 04/25/22 07/24/22  Tolia, Sunit, DO  Dextromethorphan-guaiFENesin 10-100 MG/5ML liquid Take 20 mLs by mouth 3 (three) times daily as needed for cough.    [provider]  doxycycline (VIBRAMYCIN) 100 MG capsule Take 1 capsule (100 mg total) by mouth 2 (two) times daily. 05/06/22   Tretha Sciara, MD  famotidine (PEPCID) 20 MG tablet Take 1 tablet (20 mg total) by mouth 2 (two) times daily. 04/07/22   Adrian Prows, MD  fluticasone (FLONASE) 50 MCG/ACT nasal spray Place 1-2 sprays into both nostrils daily as needed (for seasonal allergies).    [provider]  loratadine (CLARITIN) 10 MG tablet Take 10 mg by mouth daily as needed (for seasonal allergies).    [provider]  OVER THE COUNTER MEDICATION Take 1 capsule by mouth daily. Andrew Lessman's Ultimate Eye Support with Astaxanthin capsules- Take 1 capsule by mouth every morning  [provider]  OVER THE COUNTER MEDICATION Take 1 capsule by mouth daily. Kelli Hope Essential-1 with Vitamin D3-2000 capsules- Take 1 capsule by mouth once a day after breakfast    [provider]  polyethylene glycol (MIRALAX / GLYCOLAX) 17 g packet Take 17 g by mouth daily. Patient taking differently: Take 17 g by mouth daily as needed for  moderate constipation (with iron supplement). 10/30/21   Florencia Reasons, MD  Vitamin D, Ergocalciferol, (DRISDOL) 1.25 MG (50000 UNIT) CAPS capsule Take 50,000 Units by mouth 2 (two) times a week. Take 50,000 units by mouth every Wednesday and Saturday 11/07/21   [provider]                                                                                                                                    Past Surgical History Past Surgical History:  Procedure Laterality Date   ABDOMINAL HYSTERECTOMY     BREAST EXCISIONAL BIOPSY Right    BREAST LUMPECTOMY WITH RADIOACTIVE SEED LOCALIZATION Right 01/18/2016   Procedure: RIGHT BREAST LUMPECTOMY WITH RADIOACTIVE SEED LOCALIZATION;  Surgeon: Erroll Luna, MD;  Location: [facility];  Service: General;  Laterality: Right;   BREAST SURGERY     BUBBLE STUDY  03/01/2022   Procedure: BUBBLE STUDY;  Surgeon: Rex Kras, DO;  Location: [facility];  Service: Cardiovascular;;   MASTECTOMY Left    PATENT FORAMEN OVALE(PFO) CLOSURE N/A 04/07/2022   Procedure: PATENT FORAMEN OVALE(PFO) CLOSURE;  Surgeon: Adrian Prows, MD;  Location: [facility];  Service: Cardiovascular;  Laterality: N/A;   TEE WITHOUT CARDIOVERSION N/A 03/01/2022   Procedure: TRANSESOPHAGEAL ECHOCARDIOGRAM (TEE);  Surgeon: Rex Kras, DO;  Location: [facility];  Service: Cardiovascular;  Laterality: N/A;   TURBINATE REDUCTION     Family History Family History  Problem Relation Age of Onset   Stomach cancer Father    Dementia Paternal Grandmother    Stroke Paternal Grandfather    BRCA 1/2 Neg Hx    Breast cancer Neg Hx    Sleep apnea Neg Hx     Social History Social History   Tobacco Use   Smoking status: Never   Smokeless tobacco: Never  Vaping Use   Vaping Use: Never used  Substance Use Topics   Alcohol use: No   Drug use: No   Allergies Patient has no known allergies.  Review of Systems Review of Systems  All other systems reviewed  and are negative.   Physical Exam Vital Signs  I have reviewed the triage vital signs BP 136/84   Pulse 91   Temp 98.4 F (36.9 C) (Oral)   Resp 18   SpO2 97%  Physical Exam Vitals and nursing note reviewed.  Constitutional:      General: She is not in acute distress.    Appearance: She is well-developed.  HENT:     Head: Normocephalic and atraumatic.  Mouth/Throat:     Mouth: Mucous membranes are moist.  Eyes:     Pupils: Pupils are equal, round, and reactive to light.  Cardiovascular:     Rate and Rhythm: Normal rate and regular rhythm.     Heart sounds: No murmur heard. Pulmonary:     Effort: No respiratory distress.     Comments: Very mildly increased work of breathing and tachypnea.  Scattered diffuse wheezing.  No focal breath sounds. Abdominal:     General: Abdomen is flat.     Palpations: Abdomen is soft.     Tenderness: There is no abdominal tenderness.  Musculoskeletal:        General: No tenderness.     Right lower leg: No edema.     Left lower leg: No edema.  Skin:    General: Skin is warm and dry.  Neurological:     General: No focal deficit present.     Mental Status: She is alert. Mental status is at baseline.  Psychiatric:        Mood and Affect: Mood normal.        Behavior: Behavior normal.     ED Results and Treatments Labs (all labs ordered are listed, but only abnormal results are displayed) Labs Reviewed  BASIC METABOLIC PANEL - Abnormal; Notable for the following components:      Result Value   CO2 21 (*)    Glucose, Bld 130 (*)    All other components within normal limits  CBC - Abnormal; Notable for the following components:   Platelets 418 (*)    All other components within normal limits  I-STAT VENOUS BLOOD GAS, ED - Abnormal; Notable for the following components:   pH, Ven 7.444 (*)    pCO2, Ven 35.4 (*)    pO2, Ven 89 (*)    All other components within normal limits  RESP PANEL BY RT-PCR (RSV, FLU A&B, COVID)  RVPGX2   BRAIN NATRIURETIC PEPTIDE  TROPONIN I (HIGH SENSITIVITY)                                                                                                                          Radiology DG Chest Portable 1 View  Result Date: 05/11/2022 CLINICAL DATA:  Shortness of breath EXAM: PORTABLE CHEST 1 VIEW COMPARISON:  Chest radiograph 05/06/2022 FINDINGS: The cardiomediastinal silhouette is stable, with unchanged tortuosity of the thoracic aorta. There is no focal consolidation or pulmonary edema. There is no pleural effusion or pneumothorax. A hiatal hernia is again noted. Right breast and left axillary surgical clips are again noted. IMPRESSION: Stable chest with no radiographic evidence of acute cardiopulmonary process. Electronically Signed   By: Valetta Mole M.D.   On: 05/11/2022 15:48    Pertinent labs & imaging results that were available during my care of the patient were reviewed by me and considered in my medical decision making (see MDM for details).  Medications Ordered in ED Medications  ipratropium-albuterol (DUONEB)  0.5-2.5 (3) MG/3ML nebulizer solution 3 mL (has no administration in time range)                                                                                                                                     Procedures Procedures  (including critical care time)  Medical Decision Making / ED Course   MDM:  71 year old female presenting to the emergency department with shortness of breath.  Patient overall well-appearing, not hypoxic on room air.  Physical exam with mildly increased work of breathing and trace wheezing.  Patient already had fairly extensive workup 5 days ago including CT angiography of the chest which did not reveal pulmonary embolism.  She has had no significant change in her symptoms since then so doubt that repeat CT angiography would be beneficial.  CTA chest did show groundglass opacities, thought to be pneumonia although she reports no  significant improvement on antibiotics. She was started on doxycycline only. Could also be an inflammatory process or interstitial lung disease.  Lower concern for CHF as patient does not have peripheral edema or bibasilar crackles on exam.  Doubt ACS with negative troponin and EKG is overall reassuring.  Will trial breathing treatment and reassess.  Patient may need follow-up with pulmonology if workup otherwise unrevealing.      Additional history obtained: -External records from outside source obtained and reviewed including: Chart review including previous notes, labs, imaging, consultation notes including ED visit 05/05/22   Lab Tests: -I ordered, reviewed, and interpreted labs.   The pertinent results include:   Labs Reviewed  BASIC METABOLIC PANEL - Abnormal; Notable for the following components:      Result Value   CO2 21 (*)    Glucose, Bld 130 (*)    All other components within normal limits  CBC - Abnormal; Notable for the following components:   Platelets 418 (*)    All other components within normal limits  I-STAT VENOUS BLOOD GAS, ED - Abnormal; Notable for the following components:   pH, Ven 7.444 (*)    pCO2, Ven 35.4 (*)    pO2, Ven 89 (*)    All other components within normal limits  RESP PANEL BY RT-PCR (RSV, FLU A&B, COVID)  RVPGX2  BRAIN NATRIURETIC PEPTIDE  TROPONIN I (HIGH SENSITIVITY)    Notable for no hypercapnea  EKG   EKG Interpretation  Date/Time:  Thursday May 11 2022 14:24:43 EST Ventricular Rate:  89 PR Interval:  167 QRS Duration: 84 QT Interval:  376 QTC Calculation: 458 R Axis:   -33 Text Interpretation: Sinus rhythm Left axis deviation Confirmed by Garnette Gunner [phone]) on 05/11/2022 3:37:38 PM         Imaging Studies ordered: I ordered imaging studies including CXR I independently visualized and interpreted imaging. I agree with the radiologist interpretation   Medicines ordered and prescription drug management: Meds  ordered this encounter  Medications  ipratropium-albuterol (DUONEB) 0.5-2.5 (3) MG/3ML nebulizer solution 3 mL    -I have reviewed the patients home medicines and have made adjustments as needed   Cardiac Monitoring: The patient was maintained on a cardiac monitor.  I personally viewed and interpreted the cardiac monitored which showed an underlying rhythm of: NSR  Social Determinants of Health:  Diagnosis or treatment significantly limited by social determinants of health: obesity   Reevaluation: After the interventions noted above, I reevaluated the patient and found that they have improved  Co morbidities that complicate the patient evaluation  Past Medical History:  Diagnosis Date   Acute lower GI bleeding 10/25/2021   Aortic atherosclerosis ([place]) 10/25/2021   Cancer ([place])    Diverticulosis of intestine with bleeding 10/25/2021   GERD (gastroesophageal reflux disease) 10/25/2021   Hyperlipidemia 10/25/2021   Macular degeneration    PONV (postoperative nausea and vomiting)       Dispostion: Disposition decision including need for hospitalization was considered, and patient discharged from emergency department.    Final Clinical Impression(s) / ED Diagnoses Final diagnoses:  Shortness of breath     This chart was dictated using voice recognition software.  Despite best efforts to proofread,  errors can occur which can change the documentation meaning.    Cristie Hem, MD 05/11/22 [phone]

## 2022-05-16 ENCOUNTER — Other Ambulatory Visit (HOSPITAL_COMMUNITY): Payer: Self-pay

## 2022-05-17 ENCOUNTER — Ambulatory Visit (HOSPITAL_BASED_OUTPATIENT_CLINIC_OR_DEPARTMENT_OTHER)
Admission: RE | Admit: 2022-05-17 | Discharge: 2022-05-17 | Disposition: A | Discharge status: Home / Self Care | Payer: Medicare PPO | Source: Ambulatory Visit | Attending: Family Medicine | Admitting: Family Medicine

## 2022-05-17 ENCOUNTER — Emergency Department (HOSPITAL_COMMUNITY)
Admission: EM | Admit: 2022-05-17 | Discharge: 2022-05-18 | Disposition: A | Discharge status: Home / Self Care | Payer: Medicare PPO | Attending: Emergency Medicine | Admitting: Emergency Medicine

## 2022-05-17 ENCOUNTER — Other Ambulatory Visit: Payer: Self-pay

## 2022-05-17 ENCOUNTER — Emergency Department (HOSPITAL_COMMUNITY): Payer: Medicare PPO

## 2022-05-17 DIAGNOSIS — E785 Hyperlipidemia, unspecified: Secondary | ICD-10-CM | POA: Insufficient documentation

## 2022-05-17 DIAGNOSIS — Z7902 Long term (current) use of antithrombotics/antiplatelets: Secondary | ICD-10-CM | POA: Diagnosis not present

## 2022-05-17 DIAGNOSIS — Z7982 Long term (current) use of aspirin: Secondary | ICD-10-CM | POA: Diagnosis not present

## 2022-05-17 DIAGNOSIS — Z853 Personal history of malignant neoplasm of breast: Secondary | ICD-10-CM | POA: Diagnosis not present

## 2022-05-17 DIAGNOSIS — K449 Diaphragmatic hernia without obstruction or gangrene: Secondary | ICD-10-CM | POA: Diagnosis not present

## 2022-05-17 DIAGNOSIS — Z8673 Personal history of transient ischemic attack (TIA), and cerebral infarction without residual deficits: Secondary | ICD-10-CM | POA: Insufficient documentation

## 2022-05-17 DIAGNOSIS — R0602 Shortness of breath: Secondary | ICD-10-CM | POA: Insufficient documentation

## 2022-05-17 DIAGNOSIS — R002 Palpitations: Secondary | ICD-10-CM | POA: Insufficient documentation

## 2022-05-17 DIAGNOSIS — R0600 Dyspnea, unspecified: Secondary | ICD-10-CM | POA: Diagnosis not present

## 2022-05-17 DIAGNOSIS — I517 Cardiomegaly: Secondary | ICD-10-CM | POA: Diagnosis not present

## 2022-05-17 DIAGNOSIS — R0609 Other forms of dyspnea: Secondary | ICD-10-CM | POA: Diagnosis not present

## 2022-05-17 LAB — ECHOCARDIOGRAM COMPLETE
Area-P 1/2: 4.27 cm2
Calc EF: 67.8 %
S' Lateral: 3.2 cm
Single Plane A2C EF: 72.8 %
Single Plane A4C EF: 61.7 %

## 2022-05-17 LAB — CBC WITH DIFFERENTIAL/PLATELET
Abs Immature Granulocytes: 0.03 10*3/uL (ref 0.00–0.07)
Basophils Absolute: 0.1 10*3/uL (ref 0.0–0.1)
Basophils Relative: 1 %
Eosinophils Absolute: 0.8 10*3/uL — ABNORMAL HIGH (ref 0.0–0.5)
Eosinophils Relative: 6 %
HCT: 41 % (ref 36.0–46.0)
Hemoglobin: 13.1 g/dL (ref 12.0–15.0)
Immature Granulocytes: 0 %
Lymphocytes Relative: 28 %
Lymphs Abs: 3.7 10*3/uL (ref 0.7–4.0)
MCH: 28.2 pg (ref 26.0–34.0)
MCHC: 32 g/dL (ref 30.0–36.0)
MCV: 88.2 fL (ref 80.0–100.0)
Monocytes Absolute: 0.9 10*3/uL (ref 0.1–1.0)
Monocytes Relative: 7 %
Neutro Abs: 7.8 10*3/uL — ABNORMAL HIGH (ref 1.7–7.7)
Neutrophils Relative %: 58 %
Platelets: 446 10*3/uL — ABNORMAL HIGH (ref 150–400)
RBC: 4.65 MIL/uL (ref 3.87–5.11)
RDW: 14.6 % (ref 11.5–15.5)
WBC: 13.2 10*3/uL — ABNORMAL HIGH (ref 4.0–10.5)
nRBC: 0 % (ref 0.0–0.2)

## 2022-05-17 NOTE — ED Triage Notes (Signed)
Patient reports persistent SOB worse with exertion and palpitations onset last month , denies chest pain .

## 2022-05-17 NOTE — Progress Notes (Signed)
  Echocardiogram 2D Echocardiogram has been performed.  [name] [name] 05/17/2022, 10:01 AM

## 2022-05-17 NOTE — ED Provider Triage Note (Signed)
Emergency Medicine Provider Triage Evaluation Note  [name] [name] , [name] 71 y.o. female  was evaluated in triage.  Pt complains of palpitations, shortness of breath.  Has been ongoing since she had ASD closure 1 month ago.  Progressively worsening dyspnea on exertion.  Feels like she cannot lay flat at night.  Some lower extremity swelling bilaterally.  Had an echocardiogram today.  She has been seen multiple times for similar symptoms here as well as was seen by cardiology at symptom onset.  Had CTA which did not show any significant findings earlier this month.  She continues to have sensation of palpitations worse in the evening.  Review of Systems  Positive: Palpitations, shortness of breath Negative: Fever, back pain, CP  Physical Exam  BP 135/81   Pulse 74   Temp 97.8 F (36.6 C)   Resp 16   SpO2 98%  Gen:   Awake, no distress   Resp:  Normal effort  MSK:   Moves extremities without difficulty  Other:    Medical Decision Making  Medically screening exam initiated at 10:43 PM.  Appropriate orders placed.  [name] [name] was informed that the remainder of the evaluation will be completed by another provider, this initial triage assessment does not replace that evaluation, and the importance of remaining in the ED until their evaluation is complete.  Palpitations, DOE, SOB   [name] Kasa A, PA-C 05/17/22 2244

## 2022-05-18 ENCOUNTER — Telehealth: Payer: Self-pay

## 2022-05-18 LAB — BASIC METABOLIC PANEL
Anion gap: 11 (ref 5–15)
BUN: 14 mg/dL (ref 8–23)
CO2: 23 mmol/L (ref 22–32)
Calcium: 9 mg/dL (ref 8.9–10.3)
Chloride: 106 mmol/L (ref 98–111)
Creatinine, Ser: 0.72 mg/dL (ref 0.44–1.00)
GFR, Estimated: >60 mL/min (ref >60)
Glucose, Bld: 110 mg/dL — ABNORMAL HIGH (ref 70–99)
Potassium: 3.4 mmol/L — ABNORMAL LOW (ref 3.5–5.1)
Sodium: 140 mmol/L (ref 135–145)

## 2022-05-18 LAB — BRAIN NATRIURETIC PEPTIDE: B Natriuretic Peptide: 31.9 pg/mL (ref 0.0–100.0)

## 2022-05-18 LAB — TROPONIN I (HIGH SENSITIVITY)
Troponin I (High Sensitivity): 3 ng/L (ref <18)
Troponin I (High Sensitivity): 2 ng/L (ref <18)

## 2022-05-18 LAB — MAGNESIUM: Magnesium: 2.3 mg/dL (ref 1.7–2.4)

## 2022-05-18 MED ORDER — HYDROXYZINE HCL 25 MG PO TABS: 25.0000 mg | ORAL_TABLET | Freq: Four times a day (QID) | ORAL | 0 refills | Status: DC | PRN

## 2022-05-18 NOTE — Telephone Encounter (Signed)
Patient is calling because she was in the ER, last week and is now back in the ER. She is experiencing SOB and feels like her heart beat is irregular. She is unable to sit still and just can't relax. They are recommending that she sees [place] pulmonology, then is coming to see [place] on 05/23/2022. This is her 4th visit to the ER (12/10, 12/16, 12/21, and then 12/27).

## 2022-05-18 NOTE — Discharge Instructions (Addendum)
We evaluated you for your palpitations and dyspnea.  Your lab tests were normal today.  There were no signs of a heart attack and your chest x-ray was clear.  Your CT scan from earlier this month showed possible inflammation in your lungs.  I still think you need to follow-up with pulmonology.  Please schedule an appointment with them to follow-up with soon as possible.  Please also follow-up with your cardiologist to go over the results of your echocardiogram.  Your echocardiogram did not show signs of heart failure or fluid around your heart.  Please try to follow-up with your primary doctor and specialists, if you develop worsening symptoms, fainting, productive cough, fevers, or any other concerning symptoms please return to the emergency department.

## 2022-05-18 NOTE — ED Provider Notes (Signed)
[facility] EMERGENCY DEPARTMENT Provider Note  CSN: [id number] Arrival date & time: 05/17/22 1937  Chief Complaint(s) Shortness of Breath and Palpitations  HPI [name] [name] is a 71 y.o. female with history of prior stroke, GI bleed, hyperlipidemia presenting to the emergency department palpitations.  Patient has had palpitations for around 1 month.  She reports they have not significantly changed this time.  She describes it as an occasional fluttering sensation.  She reports a sensation of dyspnea which comes with the palpitations.  She has also seen cardiology for her symptoms.  She had an echocardiogram yesterday.  She was going to follow-up with pulmonology today however she was in the emergency department as she had palpitations.  She denies any fevers or chills.  Denies any cough.  She previously took a course of antibiotics which did not significantly change her symptoms.  She also had a course of prednisone which she thinks helped her symptoms somewhat.  She occasionally uses an albuterol inhaler at home which makes her palpitations worse but does not make her feel any better.   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   History of cardioembolic cerebrovascular accident (CVA) 04/07/2022   Hx of atrial septal defect repair 04/07/2022   Folate deficiency 11/16/2021   History of GI diverticular bleed 11/16/2021   TIA (transient ischemic attack) 11/15/2021   Iron deficiency anemia 11/15/2021   Acute lower GI bleeding 10/25/2021   GERD (gastroesophageal reflux  disease) 10/25/2021   Diverticulosis of intestine with bleeding 10/25/2021   Acute blood loss anemia 10/25/2021   Hyperlipidemia 10/25/2021   Aortic atherosclerosis ([place]) 10/25/2021   Home Medication(s) Prior to Admission medications   Medication Sig Start Date End Date Taking? Authorizing Provider  hydrOXYzine (ATARAX) 25 MG tablet Take 1 tablet (25 mg total) by mouth every 6 (six) hours as needed for anxiety. 05/18/22  Yes Cristie Hem, MD  albuterol (VENTOLIN HFA) 108 (90 Base) MCG/ACT inhaler Inhale 1-2 puffs into the lungs every 6 (six) hours as needed for wheezing or shortness of breath. 12/27/21   [provider]  aspirin EC 81 MG tablet Take 1 tablet (81 mg total) by mouth daily. Swallow whole. 11/17/21 11/12/22  Elodia Florence., MD  atorvastatin (LIPITOR) 40 MG tablet Take 1 tablet (40 mg total) by mouth at bedtime. 11/16/21 11/11/22  Elodia Florence., MD  benzonatate (TESSALON) 100 MG capsule Take 1 capsule (100 mg total) by mouth every 8 (eight) hours. 05/11/22   Fransico Meadow, MD  clopidogrel (PLAVIX) 75 MG tablet Take 1 tablet (75 mg total) by mouth daily. 04/25/22 07/24/22  Tolia, Sunit, DO  Dextromethorphan-guaiFENesin 10-100 MG/5ML liquid Take 20 mLs by mouth 3 (three) times daily as needed for cough.    [provider]  doxycycline (VIBRAMYCIN) 100 MG capsule Take 1 capsule (100 mg total) by mouth 2 (two) times daily. 05/06/22   Tretha Sciara, MD  famotidine (PEPCID) 20 MG tablet Take 1 tablet (20 mg total) by mouth 2 (two) times daily. 04/07/22   Adrian Prows, MD  fluticasone Asencion Islam)  50 MCG/ACT nasal spray Place 1-2 sprays into both nostrils daily as needed (for seasonal allergies).    [provider]  loratadine (CLARITIN) 10 MG tablet Take 10 mg by mouth daily as needed (for seasonal allergies).    [provider]  OVER THE COUNTER MEDICATION Take 1 capsule by mouth daily. [place] with  Astaxanthin capsules- Take 1 capsule by mouth every morning    [provider]  OVER THE COUNTER MEDICATION Take 1 capsule by mouth daily. Kelli Hope Essential-1 with Vitamin D3-2000 capsules- Take 1 capsule by mouth once a day after breakfast    [provider]  oxymetazoline (AFRIN NASAL SPRAY) 0.05 % nasal spray Place 1 spray into both nostrils 2 (two) times daily. 05/11/22   Fransico Meadow, MD  polyethylene glycol (MIRALAX / GLYCOLAX) 17 g packet Take 17 g by mouth daily. Patient taking differently: Take 17 g by mouth daily as needed for moderate constipation (with iron supplement). 10/30/21   Florencia Reasons, MD  Vitamin D, Ergocalciferol, (DRISDOL) 1.25 MG (50000 UNIT) CAPS capsule Take 50,000 Units by mouth 2 (two) times a week. Take 50,000 units by mouth every Wednesday and Saturday 11/07/21   [provider]                                                                                                                                    Past Surgical History Past Surgical History:  Procedure Laterality Date   ABDOMINAL HYSTERECTOMY     BREAST EXCISIONAL BIOPSY Right    BREAST LUMPECTOMY WITH RADIOACTIVE SEED LOCALIZATION Right 01/18/2016   Procedure: RIGHT BREAST LUMPECTOMY WITH RADIOACTIVE SEED LOCALIZATION;  Surgeon: Erroll Luna, MD;  Location: [facility];  Service: General;  Laterality: Right;   BREAST SURGERY     BUBBLE STUDY  03/01/2022   Procedure: BUBBLE STUDY;  Surgeon: Rex Kras, DO;  Location: [place];  Service: Cardiovascular;;   MASTECTOMY Left    PATENT FORAMEN OVALE(PFO) CLOSURE N/A 04/07/2022   Procedure: PATENT FORAMEN OVALE(PFO) CLOSURE;  Surgeon: Adrian Prows, MD;  Location: [facility];  Service: Cardiovascular;  Laterality: N/A;   TEE WITHOUT CARDIOVERSION N/A 03/01/2022   Procedure: TRANSESOPHAGEAL ECHOCARDIOGRAM (TEE);  Surgeon: Rex Kras, DO;  Location: [facility];  Service: Cardiovascular;   Laterality: N/A;   TURBINATE REDUCTION     Family History Family History  Problem Relation Age of Onset   Stomach cancer Father    Dementia Paternal Grandmother    Stroke Paternal Grandfather    BRCA 1/2 Neg Hx    Breast cancer Neg Hx    Sleep apnea Neg Hx     Social History Social History   Tobacco Use   Smoking status: Never   Smokeless tobacco: Never  Vaping Use   Vaping Use: Never used  Substance Use Topics   Alcohol use: No   Drug use: No  Allergies Patient has no known allergies.  Review of Systems Review of Systems  All other systems reviewed and are negative.   Physical Exam Vital Signs  I have reviewed the triage vital signs BP 121/69 (BP Location: Left Arm)   Pulse 77   Temp 97.8 F (36.6 C) (Oral)   Resp 18   SpO2 97%  Physical Exam Vitals and nursing note reviewed.  Constitutional:      General: She is not in acute distress.    Appearance: She is well-developed.  HENT:     Head: Normocephalic and atraumatic.     Mouth/Throat:     Mouth: Mucous membranes are moist.  Eyes:     Pupils: Pupils are equal, round, and reactive to light.  Cardiovascular:     Rate and Rhythm: Normal rate and regular rhythm.     Heart sounds: No murmur heard. Pulmonary:     Effort: Pulmonary effort is normal. No respiratory distress.     Breath sounds: Normal breath sounds.  Abdominal:     General: Abdomen is flat.     Palpations: Abdomen is soft.     Tenderness: There is no abdominal tenderness.  Musculoskeletal:        General: No tenderness.     Right lower leg: No edema.     Left lower leg: No edema.  Skin:    General: Skin is warm and dry.  Neurological:     General: No focal deficit present.     Mental Status: She is alert. Mental status is at baseline.  Psychiatric:        Mood and Affect: Mood normal.        Behavior: Behavior normal.     ED Results and Treatments Labs (all labs ordered are listed, but only abnormal results are  displayed) Labs Reviewed  CBC WITH DIFFERENTIAL/PLATELET - Abnormal; Notable for the following components:      Result Value   WBC 13.2 (*)    Platelets 446 (*)    Neutro Abs 7.8 (*)    Eosinophils Absolute 0.8 (*)    All other components within normal limits  BASIC METABOLIC PANEL - Abnormal; Notable for the following components:   Potassium 3.4 (*)    Glucose, Bld 110 (*)    All other components within normal limits  BRAIN NATRIURETIC PEPTIDE  MAGNESIUM  TROPONIN I (HIGH SENSITIVITY)  TROPONIN I (HIGH SENSITIVITY)                                                                                                                          Radiology DG Chest 2 View  Result Date: 05/17/2022 CLINICAL DATA:  Short of breath, palpitations EXAM: CHEST - 2 VIEW COMPARISON:  05/11/2022 FINDINGS: Frontal and lateral views of the chest demonstrate an unremarkable cardiac silhouette. Stable hiatal hernia. No airspace disease, effusion, or pneumothorax. No acute bony abnormality. IMPRESSION: 1. No acute intrathoracic process. 2. Stable hiatal hernia. Electronically Signed  By: Randa Ngo M.D.   On: 05/17/2022 23:08    Pertinent labs & imaging results that were available during my care of the patient were reviewed by me and considered in my medical decision making (see MDM for details).  Medications Ordered in ED Medications - No data to display                                                                                                                                   Procedures Procedures  (including critical care time)  Medical Decision Making / ED Course   MDM:  71 year old female presenting to the emergency department with palpitations.  Patient overall very well-appearing, vital signs are reassuring.  EKG is normal sinus rhythm.  Unclear cause of patient's palpitations and dyspnea.  She had a CT angiography of the chest performed earlier this month which did not show signs  of pulmonary embolism, did show some vague groundglass opacities which could represent inflammatory process.  She was already treated for atypical pneumonia with doxycycline without significant improvement.  She also does not have any infectious symptoms such as cough or fevers.  She had echocardiogram yesterday which did not show signs of any heart failure, pericardial effusion.  There is no sign of any shunt on her echocardiogram.  She has no physical exam findings concerning for volume overload.  Extremely low concern for ACS with delta troponin negative x 2.  Doubt pulmonary embolism as patient is already had negative CTA and her symptoms are really not changed.  Although patient reports palpitations she is not tachycardic and is in normal sinus rhythm on monitor.  Advised patient she may need to follow-up again with her cardiologist for monitoring such as a Holter monitor or Zio patch.  Suspect symptoms possibly related to the inflammatory changes newly seen on CT angiography of the chest.  Her symptoms did improve slightly with prednisone.  Advised patient that she should follow-up with pulmonology for these changes.  Patient agreeable to this.  She does report significant anxiety and requests something to help with her symptoms.  Will trial hydroxyzine advised not to drive while taking this medication or drink alcohol.  Will discharge patient to home. All questions answered. Patient comfortable with plan of discharge. Return precautions discussed with patient and specified on the after visit summary.       Additional history obtained: -Additional history obtained from friend -External records from outside source obtained and reviewed including: Chart review including previous notes, labs, imaging, consultation notes including ED notes 05/11/22   Lab Tests: -I ordered, reviewed, and interpreted labs.   The pertinent results include:   Labs Reviewed  CBC WITH DIFFERENTIAL/PLATELET - Abnormal;  Notable for the following components:      Result Value   WBC 13.2 (*)    Platelets 446 (*)    Neutro Abs 7.8 (*)    Eosinophils Absolute 0.8 (*)  All other components within normal limits  BASIC METABOLIC PANEL - Abnormal; Notable for the following components:   Potassium 3.4 (*)    Glucose, Bld 110 (*)    All other components within normal limits  BRAIN NATRIURETIC PEPTIDE  MAGNESIUM  TROPONIN I (HIGH SENSITIVITY)  TROPONIN I (HIGH SENSITIVITY)    Notable for normal troponin x2, mild leukocytosis likely 2/2 steroid use  EKG   EKG Interpretation  Date/Time:  Wednesday May 17 2022 23:20:10 EST Ventricular Rate:  69 PR Interval:  162 QRS Duration: 66 QT Interval:  380 QTC Calculation: 407 R Axis:   54 Text Interpretation: Normal sinus rhythm Nonspecific ST abnormality Abnormal ECG When compared with ECG of 11-May-2022 14:24, PREVIOUS ECG IS PRESENT Confirmed by Lennice Sites ([id number]) on 05/18/2022 9:03:06 AM         Imaging Studies ordered: I ordered imaging studies including CXR On my interpretation imaging demonstrates no acute process I independently visualized and interpreted imaging. I agree with the radiologist interpretation   Medicines ordered and prescription drug management: Meds ordered this encounter  Medications   hydrOXYzine (ATARAX) 25 MG tablet    Sig: Take 1 tablet (25 mg total) by mouth every 6 (six) hours as needed for anxiety.    Dispense:  12 tablet    Refill:  0    -I have reviewed the patients home medicines and have made adjustments as needed  Cardiac Monitoring: The patient was maintained on a cardiac monitor.  I personally viewed and interpreted the cardiac monitored which showed an underlying rhythm of: NSR  Co morbidities that complicate the patient evaluation  Past Medical History:  Diagnosis Date   Acute lower GI bleeding 10/25/2021   Aortic atherosclerosis ([place]) 10/25/2021   Cancer ([place])    Diverticulosis of intestine with  bleeding 10/25/2021   GERD (gastroesophageal reflux disease) 10/25/2021   Hyperlipidemia 10/25/2021   Macular degeneration    PONV (postoperative nausea and vomiting)       Dispostion: Disposition decision including need for hospitalization was considered, and patient discharged from emergency department.    Final Clinical Impression(s) / ED Diagnoses Final diagnoses:  Palpitations     This chart was dictated using voice recognition software.  Despite best efforts to proofread,  errors can occur which can change the documentation meaning.    Cristie Hem, MD 05/18/22 [phone]

## 2022-05-19 DIAGNOSIS — R051 Acute cough: Secondary | ICD-10-CM | POA: Diagnosis not present

## 2022-05-19 DIAGNOSIS — Z6829 Body mass index (BMI) 29.0-29.9, adult: Secondary | ICD-10-CM | POA: Diagnosis not present

## 2022-05-19 DIAGNOSIS — Z23 Encounter for immunization: Secondary | ICD-10-CM | POA: Diagnosis not present

## 2022-05-19 DIAGNOSIS — R0609 Other forms of dyspnea: Secondary | ICD-10-CM | POA: Diagnosis not present

## 2022-05-19 NOTE — Telephone Encounter (Signed)
Have her keep a log of her BP, Pulse, and weight until the next visit.  IF she has a smart watch that can check her pulse or EKG please bring in that data.  We can discuss medication or repeat monitor at the next visit - on Monday.   Dr. Terri Skains

## 2022-05-19 NOTE — Telephone Encounter (Signed)
Spoke with patient and she agreed to bring this information into her next appointment on 05/23/2022.

## 2022-05-21 NOTE — Progress Notes (Signed)
Synopsis: Referred for dyspnea by Sigmund Hazel, MD  Subjective:   PATIENT ID: [name] [name] GENDER: female DOB: [birth year], MRN: [id number]  Chief Complaint  Patient presents with   Pulmonary Consult    Self referral.  Pt c/o SOB for the past 4 wks. She gets winded walking short distances such as across parking lot this morning. She states she has a lot of trouble relaxing and is not sleeping well.    71yF with GERD, IDA, paradoxical embolus, ASD sp repair 03/2022 (amplatzer device), CVA, OSA, breast ca sp right mastectomy referred for dyspnea  Wheezing at OV with cardiology 12/5   ED visit with [facility] 12/10  ED visit with dyspnea 12/16 with CT suggestive of atypical pna given doxy. Cough improved after course of doxy.   Had course of prednisone around 12/20 which she thinks helped.   Over last month she says she's had episodes of marked dyspnea, DOE with light activity. Takes her a while to get to a point of 'calming down.' Having trouble getting comfortable enough to lie down to go to sleep. She feels like her dyspnea is worse when she lies down.   She does think albuterol helped a bit. Never had asthma growing up but does have history of seasonal allergy and sinus surgery in 2005. Not sure if she has nasal poyps.  Otherwise pertinent review of systems is negative.  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 No smoking, vaping, MJ. No pets. No recent travel. She has never lived outside of [place].    Past Medical History:  Diagnosis Date   Acute lower GI bleeding 10/25/2021   Aortic atherosclerosis (HCC) 10/25/2021   Cancer (HCC)    Diverticulosis of intestine with bleeding 10/25/2021   GERD (gastroesophageal reflux disease) 10/25/2021   Hyperlipidemia 10/25/2021   Macular degeneration    PONV (postoperative nausea and vomiting)      Family History  Problem Relation Age of  Onset   Stomach cancer Father    Dementia Paternal Grandmother    Stroke Paternal Grandfather    BRCA 1/2 Neg Hx    Breast cancer Neg Hx    Sleep apnea Neg Hx      Past Surgical History:  Procedure Laterality Date   ABDOMINAL HYSTERECTOMY     BREAST EXCISIONAL BIOPSY Right    BREAST LUMPECTOMY WITH RADIOACTIVE SEED LOCALIZATION Right 01/18/2016   Procedure: RIGHT BREAST LUMPECTOMY WITH RADIOACTIVE SEED LOCALIZATION;  Surgeon: Harriette Bouillon, MD;  Location: [facility];  Service: General;  Laterality: Right;   BREAST SURGERY     BUBBLE STUDY  03/01/2022   Procedure: BUBBLE STUDY;  Surgeon: Tessa Lerner, DO;  Location: [facility];  Service: Cardiovascular;;   MASTECTOMY Left    PATENT FORAMEN OVALE(PFO) CLOSURE N/A 04/07/2022   Procedure: PATENT FORAMEN OVALE(PFO) CLOSURE;  Surgeon: Yates Decamp, MD;  Location: [facility];  Service: Cardiovascular;  Laterality: N/A;   TEE WITHOUT CARDIOVERSION N/A 03/01/2022   Procedure: TRANSESOPHAGEAL ECHOCARDIOGRAM (TEE);  Surgeon: Tessa Lerner, DO;  Location: [facility];  Service: Cardiovascular;  Laterality: N/A;   TURBINATE REDUCTION      Social History   Socioeconomic History   Marital status: Widowed    Spouse name: Not on file   Number of children: 1   Years of education: Not on file   Highest education level: Not on file  Occupational  History   Not on file  Tobacco Use   Smoking status: Never    Passive exposure: Never   Smokeless tobacco: Never  Vaping Use   Vaping Use: Never used  Substance and Sexual Activity   Alcohol use: No   Drug use: No   Sexual activity: Not on file  Other Topics Concern   Not on file  Social History Narrative   Not on file   Social Determinants of Health   Financial Resource Strain: Not on file  Food Insecurity: Not on file  Transportation Needs: Not on file  Physical Activity: Not on file  Stress: Not on file  Social Connections: Not on file  Intimate Partner  Violence: Not on file     No Known Allergies   Outpatient Medications Prior to Visit  Medication Sig Dispense Refill   albuterol (VENTOLIN HFA) 108 (90 Base) MCG/ACT inhaler Inhale 1-2 puffs into the lungs every 6 (six) hours as needed for wheezing or shortness of breath.     aspirin EC 81 MG tablet Take 1 tablet (81 mg total) by mouth daily. Swallow whole. 30 tablet 11   atorvastatin (LIPITOR) 40 MG tablet Take 1 tablet (40 mg total) by mouth at bedtime. 30 tablet 11   benzonatate (TESSALON) 100 MG capsule Take 1 capsule (100 mg total) by mouth every 8 (eight) hours. 21 capsule 0   clopidogrel (PLAVIX) 75 MG tablet Take 1 tablet (75 mg total) by mouth daily. 90 tablet 0   Dextromethorphan-guaiFENesin 10-100 MG/5ML liquid Take 20 mLs by mouth 3 (three) times daily as needed for cough.     famotidine (PEPCID) 20 MG tablet Take 1 tablet (20 mg total) by mouth 2 (two) times daily. 60 tablet 1   fluticasone (FLONASE) 50 MCG/ACT nasal spray Place 1-2 sprays into both nostrils daily as needed (for seasonal allergies).     hydrOXYzine (ATARAX) 25 MG tablet Take 1 tablet (25 mg total) by mouth every 6 (six) hours as needed for anxiety. 12 tablet 0   loratadine (CLARITIN) 10 MG tablet Take 10 mg by mouth daily as needed (for seasonal allergies).     OVER THE COUNTER MEDICATION Take 1 capsule by mouth daily. Andrew Lessman's Ultimate Eye Support with Astaxanthin capsules- Take 1 capsule by mouth every morning     OVER THE COUNTER MEDICATION Take 1 capsule by mouth daily. Tiburcio Pea Essential-1 with Vitamin D3-2000 capsules- Take 1 capsule by mouth once a day after breakfast     polyethylene glycol (MIRALAX / GLYCOLAX) 17 g packet Take 17 g by mouth daily. (Patient taking differently: Take 17 g by mouth daily as needed for moderate constipation (with iron supplement).) 14 each 0   Vitamin D, Ergocalciferol, (DRISDOL) 1.25 MG (50000 UNIT) CAPS capsule Take 50,000 Units by mouth 2 (two) times a week.  Take 50,000 units by mouth every Wednesday and Saturday     doxycycline (VIBRAMYCIN) 100 MG capsule Take 1 capsule (100 mg total) by mouth 2 (two) times daily. 20 capsule 0   oxymetazoline (AFRIN NASAL SPRAY) 0.05 % nasal spray Place 1 spray into both nostrils 2 (two) times daily. 30 mL 0   No facility-administered medications prior to visit.       Objective:   Physical Exam:  General appearance: 71 y.o., female, NAD, conversant  Eyes: anicteric sclerae; PERRL, tracking appropriately HENT: NCAT; MMM Neck: Trachea midline; no lymphadenopathy, no JVD Lungs: CTAB, no crackles, no wheeze, with normal respiratory effort CV: RRR, no murmur  Abdomen: Soft, non-tender; non-distended, BS present  Extremities: No peripheral edema, warm Skin: Normal turgor and texture; no rash Psych: Appropriate affect Neuro: Alert and oriented to person and place, no focal deficit     Vitals:   05/23/22 1056  BP: 130/70  Pulse: 97  Temp: 98.3 F (36.8 C)  TempSrc: Oral  SpO2: 97%  Weight: 181 lb (82.1 kg)  Height: 5' 4.5" (1.638 m)   97% on RA BMI Readings from Last 3 Encounters:  05/23/22 30.59 kg/m  05/06/22 30.42 kg/m  04/25/22 31.13 kg/m   Wt Readings from Last 3 Encounters:  05/23/22 181 lb (82.1 kg)  05/06/22 180 lb (81.6 kg)  04/25/22 184 lb 3.2 oz (83.6 kg)     CBC    Component Value Date/Time   WBC 13.2 (H) 05/17/2022 2305   RBC 4.65 05/17/2022 2305   HGB 13.1 05/17/2022 2305   HGB 12.6 04/25/2022 1506   HCT 41.0 05/17/2022 2305   HCT 38.5 04/25/2022 1506   PLT 446 (H) 05/17/2022 2305   PLT 475 (H) 04/25/2022 1506   MCV 88.2 05/17/2022 2305   MCV 86 04/25/2022 1506   MCH 28.2 05/17/2022 2305   MCHC 32.0 05/17/2022 2305   RDW 14.6 05/17/2022 2305   RDW 14.9 04/25/2022 1506   LYMPHSABS 3.7 05/17/2022 2305   LYMPHSABS 1.8 04/25/2022 1506   MONOABS 0.9 05/17/2022 2305   EOSABS 0.8 (H) 05/17/2022 2305   EOSABS 1.2 (H) 04/25/2022 1506   BASOSABS 0.1 05/17/2022  2305   BASOSABS 0.1 04/25/2022 1506    Eos 700-800  Chest Imaging: CTA chest reviewed by me with multifocal ggo upper lobe predominant, bronchial wall thickening  Pulmonary Functions Testing Results:     No data to display            Echocardiogram 05/17/22:    1. Left ventricular ejection fraction, by estimation, is 55 to 60%. Left  ventricular ejection fraction by 3D volume is 58 %. The left ventricle has  normal function. The left ventricle has no regional wall motion  abnormalities. Left ventricular diastolic   parameters are indeterminate.   2. Right ventricular systolic function is normal. The right ventricular  size is normal.   3. Left atrial size was mildly dilated.   4. 16 mm Amplatzer closure device seen across the atrial septum, however,  the device is protruding into the left atrium (possibly due to leftward  atrial septal aneurysm). There is possible left to right color flow jet  noted (images 76 and 99), which may   represent a residual atrial septal defect.   5. The mitral valve is grossly normal. Trivial mitral valve  regurgitation.   6. The aortic valve is tricuspid. Aortic valve regurgitation is not  visualized.   7. The inferior vena cava is normal in size with greater than 50%  respiratory variability, suggesting right atrial pressure of 3 mmHg.       Assessment & Plan:   # Eosinophilia # Multifocal ggo # Dyspnea # Delayed sleep onset insomnia # OSA prescribed BiPAP  Not sure if she simply had atypical pneumonia and has also developed eosinophilia related to uncontrolled asthma. Alternatively consider eosinophilic pna though no new medications raise concern for this other than possibly plavix. Systemic allergy and eosinophilic reactions have been reported to nickel component of amplatzer closure device.   Plan: - IgE, ANCAs - CT Chest in 6 weeks - prednisone taper - start breztri 2 puffs twice daily rinse mouth and  brush tongue/teeth after  each use. If you respond really well to it call us and we can consider filling prescription for this or something similar long term - if need refill of sleep aid would pursue through your neurologist. If you decide to transition sleep care to our clinic I'd be happy to ensure you're followed by one of our sleep trained pulmonologists. - see you in 8 weeks or sooner if need be!     Omar Person, MD [facility] 05/23/2022 11:06 AM

## 2022-05-23 ENCOUNTER — Ambulatory Visit: Payer: Medicare PPO | Admitting: Student

## 2022-05-23 ENCOUNTER — Encounter: Payer: Self-pay | Admitting: Student

## 2022-05-23 ENCOUNTER — Encounter: Payer: Self-pay | Admitting: Cardiology

## 2022-05-23 ENCOUNTER — Ambulatory Visit: Payer: Medicare PPO | Admitting: Cardiology

## 2022-05-23 ENCOUNTER — Other Ambulatory Visit: Payer: Medicare PPO

## 2022-05-23 VITALS — BP 130/70 | HR 97 | Temp 98.3°F | Ht 64.5 in | Wt 181.0 lb

## 2022-05-23 VITALS — BP 122/81 | HR 98 | Resp 18 | Ht 64.5 in | Wt 181.4 lb

## 2022-05-23 DIAGNOSIS — R0602 Shortness of breath: Secondary | ICD-10-CM

## 2022-05-23 DIAGNOSIS — Q2112 Patent foramen ovale: Secondary | ICD-10-CM

## 2022-05-23 DIAGNOSIS — Z8673 Personal history of transient ischemic attack (TIA), and cerebral infarction without residual deficits: Secondary | ICD-10-CM

## 2022-05-23 DIAGNOSIS — Z8774 Personal history of (corrected) congenital malformations of heart and circulatory system: Secondary | ICD-10-CM

## 2022-05-23 DIAGNOSIS — Z853 Personal history of malignant neoplasm of breast: Secondary | ICD-10-CM

## 2022-05-23 DIAGNOSIS — I499 Cardiac arrhythmia, unspecified: Secondary | ICD-10-CM

## 2022-05-23 DIAGNOSIS — E782 Mixed hyperlipidemia: Secondary | ICD-10-CM

## 2022-05-23 DIAGNOSIS — I700 Atherosclerosis of aorta: Secondary | ICD-10-CM

## 2022-05-23 DIAGNOSIS — G459 Transient cerebral ischemic attack, unspecified: Secondary | ICD-10-CM

## 2022-05-23 DIAGNOSIS — R0600 Dyspnea, unspecified: Secondary | ICD-10-CM | POA: Diagnosis not present

## 2022-05-23 MED ORDER — BREZTRI AEROSPHERE 160-9-4.8 MCG/ACT IN AERO: 2.0000 | INHALATION_SPRAY | Freq: Two times a day (BID) | RESPIRATORY_TRACT | 0 refills | Status: AC

## 2022-05-23 MED ORDER — PREDNISONE 10 MG PO TABS: ORAL_TABLET | ORAL | 0 refills | Status: DC

## 2022-05-23 MED ORDER — ZOLPIDEM TARTRATE 5 MG PO TABS: 5.0000 mg | ORAL_TABLET | Freq: Every evening | ORAL | 0 refills | Status: DC | PRN

## 2022-05-23 NOTE — Patient Instructions (Signed)
-   labs today - CT Chest in 6 weeks - prednisone taper - start breztri 2 puffs twice daily rinse mouth and brush tongue/teeth after each use. If you respond really well to it call us and we can consider filling prescription for this or something similar long term - if need refill of sleep aid would pursue through your neurologist. If you decide to transition sleep care to our clinic I'd be happy to ensure you're followed by one of our sleep trained pulmonologists. - see you in 8 weeks or sooner if need be!

## 2022-05-23 NOTE — Progress Notes (Signed)
ID:  [name] [name], DOB 1951/02/01, MRN [id number]  PCP:  [name], MD  Cardiologist:  Rex Kras, DO, [facility] (established care 01/04/2022)  Date: 05/23/22 Last Office Visit: 04/25/2022  Chief Complaint  Patient presents with   Shortness of Breath   Follow-up    4 weeks    HPI  [name] [name] is a 72 y.o. Caucasian female whose  past medical history and cardiovascular risk factors include: Hyperlipidemia, obesity, obstructive sleep apnea, history of lower GI bleed, iron deficiency anemia, history of TIA and prior strokes, Patent PFO s/p 16 mm Amplatzer septal occlude, history of breast cancer status post right mastectomy, aortic atherosclerosis (CT scan 10/2021), a large hiatal hernia.  In June 2023 she was hospitalized on difficulty in moving her eyes and aphasia and was diagnosed with TIA.  Imaging revealed prior strokes.  During the stroke workup she underwent a TEE which noted a PFO and given her history of stroke/TIA the shared decision was to proceed with PFO closure which took place in November 2023.  She was requested to be on antibiotic prophylaxis for at least 6 months postprocedure, do not have all the therapy for the first 90 days, and aspirin thereafter.  Since the spring 2023 patient states that she is been short of breath, productive cough, intermittent wheezing.  Since last office visit she was seen in the ER on 05/06/2022 according to the patient was diagnosed with walking pneumonia was prescribed doxycycline.  Patient states that this intervention " tremendously helped her symptoms."  She saw her PA at PCP's office and was given a trial of prednisone which helped as well.   She is followed up with pulmonary medicine is scheduled for a PFT and a CT in February 2024.  At times she experiences irregular heartbeat with palpitations.  No associated near syncope or syncopal events.  The symptoms are short-lived but noticeable.  She has lost 3 pound since last office visit.  BNP  within normal limits as well.  Patient states that she is not able to have a good night rest.  Has been evaluated for sleep apnea back in August 2023 and still awaiting a BiPAP machine.  ALLERGIES: No Known Allergies  MEDICATION LIST PRIOR TO VISIT: Current Meds  Medication Sig   albuterol (VENTOLIN HFA) 108 (90 Base) MCG/ACT inhaler Inhale 1-2 puffs into the lungs every 6 (six) hours as needed for wheezing or shortness of breath.   aspirin EC 81 MG tablet Take 1 tablet (81 mg total) by mouth daily. Swallow whole.   atorvastatin (LIPITOR) 40 MG tablet Take 1 tablet (40 mg total) by mouth at bedtime.   Budeson-Glycopyrrol-Formoterol (BREZTRI AEROSPHERE) 160-9-4.8 MCG/ACT AERO Inhale 2 puffs into the lungs in the morning and at bedtime.   clopidogrel (PLAVIX) 75 MG tablet Take 1 tablet (75 mg total) by mouth daily.   ergocalciferol (VITAMIN D2) 1.25 MG (50000 UT) capsule Take 50,000 Units by mouth once a week.   famotidine (PEPCID) 20 MG tablet Take 1 tablet (20 mg total) by mouth 2 (two) times daily.   fluticasone (FLONASE) 50 MCG/ACT nasal spray Place 1-2 sprays into both nostrils daily as needed (for seasonal allergies).   hydrOXYzine (ATARAX) 25 MG tablet Take 1 tablet (25 mg total) by mouth every 6 (six) hours as needed for anxiety.   OVER THE COUNTER MEDICATION Take 1 capsule by mouth daily. [employer]'s Ultimate [employer] Support with Astaxanthin capsules- Take 1 capsule by mouth every morning   OVER  THE COUNTER MEDICATION Take 1 capsule by mouth daily. [place] Essential-1 with Vitamin D3-2000 capsules- Take 1 capsule by mouth once a day after breakfast   polyethylene glycol (MIRALAX / GLYCOLAX) 17 g packet Take 17 g by mouth daily. (Patient taking differently: Take 17 g by mouth daily as needed for moderate constipation (with iron supplement).)   Vitamin D, Ergocalciferol, (DRISDOL) 1.25 MG (50000 UNIT) CAPS capsule Take 50,000 Units by mouth 2 (two) times a week. Take 50,000 units  by mouth every Wednesday and Saturday   zolpidem (AMBIEN) 5 MG tablet Take 1 tablet (5 mg total) by mouth at bedtime as needed for sleep.     PAST MEDICAL HISTORY: Past Medical History:  Diagnosis Date   Acute lower GI bleeding 10/25/2021   Aortic atherosclerosis ([place]) 10/25/2021   Cancer ([place])    Diverticulosis of intestine with bleeding 10/25/2021   GERD (gastroesophageal reflux disease) 10/25/2021   Hyperlipidemia 10/25/2021   Macular degeneration    PONV (postoperative nausea and vomiting)     PAST SURGICAL HISTORY: Past Surgical History:  Procedure Laterality Date   ABDOMINAL HYSTERECTOMY     BREAST EXCISIONAL BIOPSY Right    BREAST LUMPECTOMY WITH RADIOACTIVE SEED LOCALIZATION Right 01/18/2016   Procedure: RIGHT BREAST LUMPECTOMY WITH RADIOACTIVE SEED LOCALIZATION;  Surgeon: Erroll Luna, MD;  Location: [place];  Service: General;  Laterality: Right;   BREAST SURGERY     BUBBLE STUDY  03/01/2022   Procedure: BUBBLE STUDY;  Surgeon: Rex Kras, DO;  Location: [place];  Service: Cardiovascular;;   MASTECTOMY Left    PATENT FORAMEN OVALE(PFO) CLOSURE N/A 04/07/2022   Procedure: PATENT FORAMEN OVALE(PFO) CLOSURE;  Surgeon: Adrian Prows, MD;  Location: [place];  Service: Cardiovascular;  Laterality: N/A;   TEE WITHOUT CARDIOVERSION N/A 03/01/2022   Procedure: TRANSESOPHAGEAL ECHOCARDIOGRAM (TEE);  Surgeon: Rex Kras, DO;  Location: [facility];  Service: Cardiovascular;  Laterality: N/A;   TURBINATE REDUCTION      FAMILY HISTORY: The patient family history includes Dementia in her paternal grandmother; Stomach cancer in her father; Stroke in her paternal grandfather.  SOCIAL HISTORY:  The patient  reports that she has never smoked. She has never been exposed to tobacco smoke. She has never used smokeless tobacco. She reports that she does not drink alcohol and does not use drugs.  REVIEW OF SYSTEMS: Review of Systems  Constitutional: Positive  for malaise/fatigue.  Cardiovascular:  Positive for irregular heartbeat and palpitations. Negative for chest pain, claudication, leg swelling, near-syncope, orthopnea, paroxysmal nocturnal dyspnea and syncope.  Respiratory:  Positive for cough (dry, improved), shortness of breath (better) and sleep disturbances due to breathing. Negative for sputum production and wheezing.   Hematologic/Lymphatic: Negative for bleeding problem.  Musculoskeletal:  Negative for muscle cramps and myalgias.  Neurological:  Negative for dizziness and light-headedness.  Psychiatric/Behavioral:         Anxious    PHYSICAL EXAM:    05/23/2022    1:35 PM 05/23/2022   10:56 AM 05/18/2022    3:58 PM  Vitals with BMI  Height 5' 4.5" 5' 4.5"   Weight 181 lbs 6 oz 181 lbs   BMI 56.38 93.7   Systolic 342 876 811  Diastolic 81 70 69  Pulse 98 97 70   Physical Exam  Constitutional: No distress.  Age appropriate, hemodynamically stable.   Neck: No JVD present.  Cardiovascular: Normal rate, regular rhythm, S1 normal, S2 normal, intact distal pulses and normal pulses. Exam reveals  no gallop, no S3 and no S4.  No murmur heard. Pulses:      Dorsalis pedis pulses are 2+ on the right side and 2+ on the left side.       Posterior tibial pulses are 2+ on the right side and 2+ on the left side.  Pulmonary/Chest: Effort normal. No stridor. She has no wheezes. She has no rales.  Abdominal: Soft. Bowel sounds are normal. She exhibits no distension. There is no abdominal tenderness.  Musculoskeletal:        General: No edema.     Cervical back: Neck supple.  Neurological: She is alert and oriented to person, place, and time. She has intact cranial nerves (2-12).  Skin: Skin is warm and moist.   RADIOLOGY: CT angio GI bleed protocol: 10/25/2021: 1. No evidence of acute abnormalities within the abdomen or pelvis. 2. Diffuse colonic diverticulosis without evidence of acute diverticulitis. 3. Large hiatal hernia. Aortic  Atherosclerosis (ICD10-I70.0).  CT of the head without contrast: 11/15/2021 1. No evidence of acute intracranial abnormality. 2. Small remote appearing right cerebellar lacunar infarct. 3. Moderate paranasal sinus mucosal thickening.  MRI brain without contrast: 11/15/2021 1. Subtle 7 mm acute infarct versus artifact within the posterior right frontal lobe subcortical white matter. 2. Background mild cerebral white matter chronic small vessel ischemic disease. 3. Chronic lacunar infarcts within the posterior right lentiform nucleus and medial left thalamus. 4. Subcentimeter chronic infarcts within the bilateral cerebellar hemispheres. 5. Paranasal sinus disease, as described.  CARDIAC DATABASE: EKG: 05/23/2022: Sinus rhythm, 93 bpm, consider old inferior infarct, poor R wave progression.  No significant change compared to 03/14/2022.   Echocardiogram: 11/16/2021:  1. Left ventricular ejection fraction, by estimation, is 60 to 65%. The left ventricle has normal function. The left ventricle has no regional wall motion abnormalities. Left ventricular diastolic parameters are consistent with Grade I diastolic dysfunction (impaired relaxation).   2. Right ventricular systolic function is normal. The right ventricular size is normal.   3. The mitral valve is normal in structure. No evidence of mitral valve regurgitation. No evidence of mitral stenosis.   4. The aortic valve is normal in structure. Aortic valve regurgitation is not visualized. No aortic stenosis is present.   5. The IVC is normal in size with greater than 50% respiratory variability, suggesting right atrial pressure of 3 mmHg.  Conclusion(s)/Recommendation(s): No intracardiac source of embolism detected on this transthoracic study. Consider a transesophageal echocardiogram to exclude cardiac source of embolism if clinically indicated.   TEE 03/01/2022:  1. Left ventricular ejection fraction, by estimation, is 60 to 65%. The left  ventricle has normal function. The left ventricle has no regional wall motion abnormalities.   2. Right ventricular systolic function is normal. The right ventricular size is normal.   3. No left atrial/left atrial appendage thrombus was detected.   4. The mitral valve is normal in structure. No evidence of mitral valve regurgitation. No evidence of mitral stenosis.   5. The aortic valve is tricuspid. Aortic valve regurgitation is not visualized. No aortic stenosis is present.   6. Evidence of atrial level shunting detected by color flow Doppler. Agitated saline contrast bubble study was positive with shunting observed within 3-6 cardiac cycles suggestive of interatrial shunt.   05/17/2022: LVEF 55-60%, no regional wall motion abnormalities, indeterminate diastolic function, right ventricular size and function normal, left atrium mildly dilated, 16 mm Amplatzer device across the atrial septum (possible left to right flow), trivial MR, RAP estimated at 3  mmHg  Stress Testing: Exercise Tetrofosmin stress test 01/24/2022: Exercise nuclear stress test was performed using Bruce protocol. Patient reached 7 METS, and 90% of age predicted maximum heart rate. Exercise capacity was low. No chest pain reported. Heart rate and hemodynamic response were normal. Stress EKG revealed no ischemic changes. Normal myocardial perfusion. Stress LVEF 63%. Low risk study.  Heart Catheterization: None  Atrial septal defect closure 04/03/2022: Successful closure of the atrial septal defect using a 16 mm Amplatzer septal occluder, although the defect was PFO, had secundum ASD anatomy and very complex septum with severe aneurysmal motion.   Patient will need antibiotic prophylaxis for 6 months.  She will be on aspirin indefinitely and Plavix for 90 days.  Cardiac monitor (Zio Patch): January 04, 2022-January 18, 2022 Dominant rhythm sinus, followed by tachycardia (15%). Heart rate 50-193 bpm.  Avg HR 84 bpm. No atrial  fibrillation, ventricular tachycardia, high grade AV block, pauses (3 seconds or longer). Total ventricular ectopic burden <1%. Total supraventricular ectopic burden <1%. Episodes of paroxysmal supraventricular tachycardia. Patient triggered events: 0.   LABORATORY DATA:    Latest Ref Rng & Units 05/17/2022   11:05 PM 05/11/2022    3:06 PM 05/11/2022    2:45 PM  CBC  WBC 4.0 - 10.5 K/uL 13.2   7.7   Hemoglobin 12.0 - 15.0 g/dL 13.1  12.9  12.2   Hematocrit 36.0 - 46.0 % 41.0  38.0  37.0   Platelets 150 - 400 K/uL 446   418        Latest Ref Rng & Units 05/17/2022   11:05 PM 05/11/2022    3:06 PM 05/11/2022    2:45 PM  CMP  Glucose 70 - 99 mg/dL 110   130   BUN 8 - 23 mg/dL 14   11   Creatinine 0.44 - 1.00 mg/dL 0.72   0.73   Sodium 135 - 145 mmol/L 140  139  135   Potassium 3.5 - 5.1 mmol/L 3.4  4.5  4.5   Chloride 98 - 111 mmol/L 106   106   CO2 22 - 32 mmol/L 23   21   Calcium 8.9 - 10.3 mg/dL 9.0   9.4     Lipid Panel     Component Value Date/Time   CHOL 158 11/15/2021 1130   TRIG 143 11/15/2021 1130   HDL 49 11/15/2021 1130   CHOLHDL 3.2 11/15/2021 1130   VLDL 29 11/15/2021 1130   LDLCALC 80 11/15/2021 1130    No components found for: "NTPROBNP" Recent Labs    04/25/22 1504  PROBNP 97   Recent Labs    11/15/21 1130  TSH 1.110    BMP Recent Labs    05/06/22 1155 05/11/22 1445 05/11/22 1506 05/17/22 2305  NA 138 135 139 140  K 4.0 4.5 4.5 3.4*  CL 105 106  --  106  CO2 23 21*  --  23  GLUCOSE 109* 130*  --  110*  BUN 8 11  --  14  CREATININE 0.79 0.73  --  0.72  CALCIUM 9.5 9.4  --  9.0  GFRNONAA >60 >60  --  >60    HEMOGLOBIN A1C Lab Results  Component Value Date   HGBA1C 5.4 11/15/2021   MPG 108 11/15/2021    IMPRESSION:    ICD-10-CM   1. Shortness of breath  R06.02 EKG 12-Lead    LONG TERM MONITOR (3-14 DAYS)    2. TIA (transient ischemic attack)  G45.9  LONG TERM MONITOR (3-14 DAYS)    3. History of stroke  Z86.73 LONG  TERM MONITOR (3-14 DAYS)    4. PFO (patent foramen ovale)  Q21.12 EKG 12-Lead    LONG TERM MONITOR (3-14 DAYS)    5. Status post device closure of PFO  Z87.74     6. Mixed hyperlipidemia  E78.2     7. Aortic atherosclerosis (HCC)  I70.0     8. HX: breast cancer  Z85.3     9. Irregular heart beat  I49.9 LONG TERM MONITOR (3-14 DAYS)       RECOMMENDATIONS: [name] [name] is a 72 y.o. Caucasian female whose past medical history and cardiac risk factors include: Hyperlipidemia, obesity, obstructive sleep apnea, history of lower GI bleed, iron deficiency anemia, history of TIA and prior strokes, patent foramen ovale, sleep apnea awaiting CPAP, history of breast cancer status post right mastectomy, aortic atherosclerosis (CT scan 10/2021), a large hiatal hernia.  Shortness of breath: Improving since last office visit. Has completed a course of doxycycline and prednisone. Weight has trended down, home blood pressures are well-controlled, and BMP within normal limits. Clinically euvolemic and not in heart failure. Given her shortness of breath and irregular heartbeat at times recommend repeating a monitor to evaluate for atrial fibrillation as she is recently underwent PFO closure. Pulmonary medicine hypothesizes that she may have nickel allergy and would benefit from further workup.  I have asked her to discuss this with PCP and consulting allergy / immunology for further evaluation. Both myself and the patient thinks that this is low probability since her symptoms were present since spring 2023. She has repeat PFTs and CT scan in February 2024. Will continue to monitor  TIA (transient ischemic attack) / History of stroke Index event in June 2023. She has had prior bilateral strokes TEE: Preserved EF, sonicated saline study and color Doppler positive for PFO. Zio patch: No documented A-fib during the monitoring period.  Predominantly sinus with rare PSVT. MPI: Low risk stress test. She  has been evaluated for sleep apnea and is awaiting a BiPAP Educate on importance of secondary prevention.  Status post PFO closure Status post 97m Amplatzer septal occluder. Dual antiplatelet therapy for 90 days and aspirin thereafter. Antibiotic prophylaxis for 6 months post procedure. Independently reviewed the echo from December 2023-no obvious flow per color Doppler (both myself and Dr. GEinar Gipreviewed).  Given her shortness of breath and ongoing pulmonary workup we will hold off on TEE at this time as it will not change clinical outcomes.  Mixed hyperlipidemia Currently on Lipitor.   She denies myalgia or other side effects. Most recent lipids dated June 2023, independently reviewed as noted above. Recommend a goal LDL <70 mg/dL. Currently managed by primary care provider.   FINAL MEDICATION LIST END OF ENCOUNTER: No orders of the defined types were placed in this encounter.   Medications Discontinued During This Encounter  Medication Reason   benzonatate (TESSALON) 100 MG capsule Completed Course   Dextromethorphan-guaiFENesin 10-100 MG/5ML liquid Patient Preference   loratadine (CLARITIN) 10 MG tablet Patient Preference   predniSONE (DELTASONE) 10 MG tablet Completed Course     Current Outpatient Medications:    albuterol (VENTOLIN HFA) 108 (90 Base) MCG/ACT inhaler, Inhale 1-2 puffs into the lungs every 6 (six) hours as needed for wheezing or shortness of breath., Disp: , Rfl:    aspirin EC 81 MG tablet, Take 1 tablet (81 mg total) by mouth daily. Swallow whole., Disp: 30 tablet, Rfl:  11   atorvastatin (LIPITOR) 40 MG tablet, Take 1 tablet (40 mg total) by mouth at bedtime., Disp: 30 tablet, Rfl: 11   Budeson-Glycopyrrol-Formoterol (BREZTRI AEROSPHERE) 160-9-4.8 MCG/ACT AERO, Inhale 2 puffs into the lungs in the morning and at bedtime., Disp: 5.9 g, Rfl: 0   clopidogrel (PLAVIX) 75 MG tablet, Take 1 tablet (75 mg total) by mouth daily., Disp: 90 tablet, Rfl: 0    ergocalciferol (VITAMIN D2) 1.25 MG (50000 UT) capsule, Take 50,000 Units by mouth once a week., Disp: , Rfl:    famotidine (PEPCID) 20 MG tablet, Take 1 tablet (20 mg total) by mouth 2 (two) times daily., Disp: 60 tablet, Rfl: 1   fluticasone (FLONASE) 50 MCG/ACT nasal spray, Place 1-2 sprays into both nostrils daily as needed (for seasonal allergies)., Disp: , Rfl:    hydrOXYzine (ATARAX) 25 MG tablet, Take 1 tablet (25 mg total) by mouth every 6 (six) hours as needed for anxiety., Disp: 12 tablet, Rfl: 0   OVER THE COUNTER MEDICATION, Take 1 capsule by mouth daily. [place] with Astaxanthin capsules- Take 1 capsule by mouth every morning, Disp: , Rfl:    OVER THE COUNTER MEDICATION, Take 1 capsule by mouth daily. Kelli Hope Essential-1 with Vitamin D3-2000 capsules- Take 1 capsule by mouth once a day after breakfast, Disp: , Rfl:    polyethylene glycol (MIRALAX / GLYCOLAX) 17 g packet, Take 17 g by mouth daily. (Patient taking differently: Take 17 g by mouth daily as needed for moderate constipation (with iron supplement).), Disp: 14 each, Rfl: 0   Vitamin D, Ergocalciferol, (DRISDOL) 1.25 MG (50000 UNIT) CAPS capsule, Take 50,000 Units by mouth 2 (two) times a week. Take 50,000 units by mouth every Wednesday and Saturday, Disp: , Rfl:    zolpidem (AMBIEN) 5 MG tablet, Take 1 tablet (5 mg total) by mouth at bedtime as needed for sleep., Disp: 30 tablet, Rfl: 0  Orders Placed This Encounter  Procedures   LONG TERM MONITOR (3-14 DAYS)   EKG 12-Lead    There are no Patient Instructions on file for this visit.   --Continue cardiac medications as reconciled in final medication list. --Return in about 8 weeks (around 07/18/2022) for Follow up, Dyspnea, Review test results. or sooner if needed. --Continue follow-up with your primary care physician regarding the management of your other chronic comorbid conditions.  Patient's questions and concerns were addressed to  her satisfaction. She voices understanding of the instructions provided during this encounter.   This note was created using a voice recognition software as a result there may be grammatical errors inadvertently enclosed that do not reflect the nature of this encounter. Every attempt is made to correct such errors.  Rex Kras, [place], [facility]  Pager: [phone] Office: [phone]

## 2022-05-25 LAB — IGE: IgE (Immunoglobulin E), Serum: 87 kU/L (ref <=114)

## 2022-05-25 LAB — ANCA SCREEN W REFLEX TITER: ANCA SCREEN: NEGATIVE

## 2022-05-30 ENCOUNTER — Emergency Department (HOSPITAL_COMMUNITY): Payer: Medicare PPO

## 2022-05-30 ENCOUNTER — Telehealth: Payer: Self-pay

## 2022-05-30 ENCOUNTER — Encounter (HOSPITAL_COMMUNITY): Payer: Self-pay | Admitting: *Deleted

## 2022-05-30 ENCOUNTER — Inpatient Hospital Stay (HOSPITAL_COMMUNITY)
Admission: EM | Admit: 2022-05-30 | Discharge: 2022-06-02 | DRG: 378 | Disposition: A | Discharge status: Home / Self Care | Payer: Medicare PPO | Attending: Internal Medicine | Admitting: Internal Medicine

## 2022-05-30 ENCOUNTER — Other Ambulatory Visit: Payer: Self-pay

## 2022-05-30 DIAGNOSIS — G4733 Obstructive sleep apnea (adult) (pediatric): Secondary | ICD-10-CM | POA: Diagnosis present

## 2022-05-30 DIAGNOSIS — E782 Mixed hyperlipidemia: Secondary | ICD-10-CM | POA: Diagnosis not present

## 2022-05-30 DIAGNOSIS — D62 Acute posthemorrhagic anemia: Secondary | ICD-10-CM | POA: Diagnosis present

## 2022-05-30 DIAGNOSIS — D509 Iron deficiency anemia, unspecified: Secondary | ICD-10-CM | POA: Diagnosis present

## 2022-05-30 DIAGNOSIS — K449 Diaphragmatic hernia without obstruction or gangrene: Secondary | ICD-10-CM | POA: Diagnosis not present

## 2022-05-30 DIAGNOSIS — E785 Hyperlipidemia, unspecified: Secondary | ICD-10-CM | POA: Diagnosis present

## 2022-05-30 DIAGNOSIS — E669 Obesity, unspecified: Secondary | ICD-10-CM | POA: Diagnosis present

## 2022-05-30 DIAGNOSIS — K219 Gastro-esophageal reflux disease without esophagitis: Secondary | ICD-10-CM | POA: Diagnosis present

## 2022-05-30 DIAGNOSIS — F419 Anxiety disorder, unspecified: Secondary | ICD-10-CM | POA: Diagnosis present

## 2022-05-30 DIAGNOSIS — Z79899 Other long term (current) drug therapy: Secondary | ICD-10-CM

## 2022-05-30 DIAGNOSIS — G4700 Insomnia, unspecified: Secondary | ICD-10-CM | POA: Diagnosis not present

## 2022-05-30 DIAGNOSIS — N3289 Other specified disorders of bladder: Secondary | ICD-10-CM | POA: Diagnosis not present

## 2022-05-30 DIAGNOSIS — R109 Unspecified abdominal pain: Secondary | ICD-10-CM | POA: Diagnosis not present

## 2022-05-30 DIAGNOSIS — Q2112 Patent foramen ovale: Secondary | ICD-10-CM

## 2022-05-30 DIAGNOSIS — E876 Hypokalemia: Secondary | ICD-10-CM | POA: Diagnosis not present

## 2022-05-30 DIAGNOSIS — Z8774 Personal history of (corrected) congenital malformations of heart and circulatory system: Secondary | ICD-10-CM | POA: Diagnosis not present

## 2022-05-30 DIAGNOSIS — K625 Hemorrhage of anus and rectum: Secondary | ICD-10-CM | POA: Diagnosis not present

## 2022-05-30 DIAGNOSIS — Z7951 Long term (current) use of inhaled steroids: Secondary | ICD-10-CM | POA: Diagnosis not present

## 2022-05-30 DIAGNOSIS — G459 Transient cerebral ischemic attack, unspecified: Secondary | ICD-10-CM | POA: Diagnosis not present

## 2022-05-30 DIAGNOSIS — Z7952 Long term (current) use of systemic steroids: Secondary | ICD-10-CM

## 2022-05-30 DIAGNOSIS — Z6831 Body mass index (BMI) 31.0-31.9, adult: Secondary | ICD-10-CM

## 2022-05-30 DIAGNOSIS — Z7902 Long term (current) use of antithrombotics/antiplatelets: Secondary | ICD-10-CM | POA: Diagnosis not present

## 2022-05-30 DIAGNOSIS — I10 Essential (primary) hypertension: Secondary | ICD-10-CM | POA: Diagnosis not present

## 2022-05-30 DIAGNOSIS — K5730 Diverticulosis of large intestine without perforation or abscess without bleeding: Secondary | ICD-10-CM | POA: Diagnosis not present

## 2022-05-30 DIAGNOSIS — I700 Atherosclerosis of aorta: Secondary | ICD-10-CM | POA: Diagnosis not present

## 2022-05-30 DIAGNOSIS — Z90710 Acquired absence of both cervix and uterus: Secondary | ICD-10-CM

## 2022-05-30 DIAGNOSIS — K922 Gastrointestinal hemorrhage, unspecified: Secondary | ICD-10-CM | POA: Diagnosis not present

## 2022-05-30 DIAGNOSIS — R079 Chest pain, unspecified: Secondary | ICD-10-CM | POA: Diagnosis not present

## 2022-05-30 DIAGNOSIS — H3530 Unspecified macular degeneration: Secondary | ICD-10-CM | POA: Diagnosis present

## 2022-05-30 DIAGNOSIS — R58 Hemorrhage, not elsewhere classified: Secondary | ICD-10-CM | POA: Diagnosis not present

## 2022-05-30 DIAGNOSIS — Z9012 Acquired absence of left breast and nipple: Secondary | ICD-10-CM | POA: Diagnosis not present

## 2022-05-30 DIAGNOSIS — D72829 Elevated white blood cell count, unspecified: Secondary | ICD-10-CM | POA: Insufficient documentation

## 2022-05-30 DIAGNOSIS — K921 Melena: Secondary | ICD-10-CM | POA: Diagnosis not present

## 2022-05-30 DIAGNOSIS — Z8673 Personal history of transient ischemic attack (TIA), and cerebral infarction without residual deficits: Secondary | ICD-10-CM | POA: Diagnosis not present

## 2022-05-30 LAB — CBC WITH DIFFERENTIAL/PLATELET
Abs Immature Granulocytes: 0.08 10*3/uL — ABNORMAL HIGH (ref 0.00–0.07)
Basophils Absolute: 0.1 10*3/uL (ref 0.0–0.1)
Basophils Relative: 1 %
Eosinophils Absolute: 0.5 10*3/uL (ref 0.0–0.5)
Eosinophils Relative: 3 %
HCT: 32.9 % — ABNORMAL LOW (ref 36.0–46.0)
Hemoglobin: 10.4 g/dL — ABNORMAL LOW (ref 12.0–15.0)
Immature Granulocytes: 1 %
Lymphocytes Relative: 18 %
Lymphs Abs: 3 10*3/uL (ref 0.7–4.0)
MCH: 28.6 pg (ref 26.0–34.0)
MCHC: 31.6 g/dL (ref 30.0–36.0)
MCV: 90.4 fL (ref 80.0–100.0)
Monocytes Absolute: 1 10*3/uL (ref 0.1–1.0)
Monocytes Relative: 6 %
Neutro Abs: 12.1 10*3/uL — ABNORMAL HIGH (ref 1.7–7.7)
Neutrophils Relative %: 71 %
Platelets: 400 10*3/uL (ref 150–400)
RBC: 3.64 MIL/uL — ABNORMAL LOW (ref 3.87–5.11)
RDW: 15 % (ref 11.5–15.5)
WBC: 16.8 10*3/uL — ABNORMAL HIGH (ref 4.0–10.5)
nRBC: 0 % (ref 0.0–0.2)

## 2022-05-30 LAB — COMPREHENSIVE METABOLIC PANEL
ALT: 31 U/L (ref 0–44)
AST: 24 U/L (ref 15–41)
Albumin: 4.1 g/dL (ref 3.5–5.0)
Alkaline Phosphatase: 60 U/L (ref 38–126)
Anion gap: 9 (ref 5–15)
BUN: 32 mg/dL — ABNORMAL HIGH (ref 8–23)
CO2: 21 mmol/L — ABNORMAL LOW (ref 22–32)
Calcium: 8.7 mg/dL — ABNORMAL LOW (ref 8.9–10.3)
Chloride: 107 mmol/L (ref 98–111)
Creatinine, Ser: 0.8 mg/dL (ref 0.44–1.00)
GFR, Estimated: >60 mL/min (ref >60)
Glucose, Bld: 121 mg/dL — ABNORMAL HIGH (ref 70–99)
Potassium: 3.3 mmol/L — ABNORMAL LOW (ref 3.5–5.1)
Sodium: 137 mmol/L (ref 135–145)
Total Bilirubin: 0.9 mg/dL (ref 0.3–1.2)
Total Protein: 6.7 g/dL (ref 6.5–8.1)

## 2022-05-30 LAB — HEMOGLOBIN AND HEMATOCRIT, BLOOD
HCT: 26.7 % — ABNORMAL LOW (ref 36.0–46.0)
Hemoglobin: 8.4 g/dL — ABNORMAL LOW (ref 12.0–15.0)

## 2022-05-30 LAB — PROTIME-INR
INR: 1.1 (ref 0.8–1.2)
Prothrombin Time: 13.7 seconds (ref 11.4–15.2)

## 2022-05-30 LAB — POC OCCULT BLOOD, ED: Fecal Occult Bld: POSITIVE — AB

## 2022-05-30 LAB — MAGNESIUM: Magnesium: 2.3 mg/dL (ref 1.7–2.4)

## 2022-05-30 MED ORDER — PANTOPRAZOLE SODIUM 40 MG IV SOLR
40.0000 mg | Freq: Two times a day (BID) | INTRAVENOUS | Status: DC
  Administered 2022-05-30 – 2022-06-01 (×4): 40 mg via INTRAVENOUS
  Filled 2022-05-30 (×4): qty 10

## 2022-05-30 MED ORDER — FLUTICASONE PROPIONATE 50 MCG/ACT NA SUSP: 1.0000 | Freq: Every day | NASAL | Status: DC | PRN

## 2022-05-30 MED ORDER — ZOLPIDEM TARTRATE 5 MG PO TABS
5.0000 mg | ORAL_TABLET | Freq: Every day | ORAL | Status: DC
  Administered 2022-05-30 – 2022-06-01 (×3): 5 mg via ORAL
  Filled 2022-05-30 (×3): qty 1

## 2022-05-30 MED ORDER — MOMETASONE FURO-FORMOTEROL FUM 100-5 MCG/ACT IN AERO
2.0000 | INHALATION_SPRAY | Freq: Two times a day (BID) | RESPIRATORY_TRACT | Status: DC
  Administered 2022-05-30 – 2022-06-02 (×6): 2 via RESPIRATORY_TRACT
  Filled 2022-05-30: qty 8.8

## 2022-05-30 MED ORDER — PANTOPRAZOLE SODIUM 40 MG IV SOLR
40.0000 mg | Freq: Once | INTRAVENOUS | Status: AC
  Administered 2022-05-30: 40 mg via INTRAVENOUS
  Filled 2022-05-30: qty 10

## 2022-05-30 MED ORDER — SODIUM CHLORIDE (PF) 0.9 % IJ SOLN
INTRAMUSCULAR | Status: AC
  Filled 2022-05-30: qty 50

## 2022-05-30 MED ORDER — UMECLIDINIUM BROMIDE 62.5 MCG/ACT IN AEPB
1.0000 | INHALATION_SPRAY | Freq: Every day | RESPIRATORY_TRACT | Status: DC
  Administered 2022-05-31 – 2022-06-02 (×3): 1 via RESPIRATORY_TRACT
  Filled 2022-05-30: qty 7

## 2022-05-30 MED ORDER — SODIUM CHLORIDE 0.9 % IV BOLUS
1000.0000 mL | Freq: Once | INTRAVENOUS | Status: AC
  Administered 2022-05-30: 1000 mL via INTRAVENOUS

## 2022-05-30 MED ORDER — POTASSIUM CHLORIDE 10 MEQ/100ML IV SOLN
10.0000 meq | INTRAVENOUS | Status: AC
  Administered 2022-05-30 (×4): 10 meq via INTRAVENOUS
  Filled 2022-05-30 (×2): qty 100

## 2022-05-30 MED ORDER — IOHEXOL 350 MG/ML SOLN
100.0000 mL | Freq: Once | INTRAVENOUS | Status: AC | PRN
  Administered 2022-05-30: 100 mL via INTRAVENOUS

## 2022-05-30 MED ORDER — DIPHENHYDRAMINE HCL 50 MG/ML IJ SOLN
6.2500 mg | Freq: Once | INTRAMUSCULAR | Status: AC
  Administered 2022-05-31: 6.5 mg via INTRAVENOUS
  Filled 2022-05-30: qty 1

## 2022-05-30 MED ORDER — BUDESON-GLYCOPYRROL-FORMOTEROL 160-9-4.8 MCG/ACT IN AERO: 2.0000 | INHALATION_SPRAY | Freq: Two times a day (BID) | RESPIRATORY_TRACT | Status: DC

## 2022-05-30 MED ORDER — SODIUM CHLORIDE 0.9 % IV SOLN: INTRAVENOUS | Status: DC

## 2022-05-30 MED ORDER — ATORVASTATIN CALCIUM 40 MG PO TABS
40.0000 mg | ORAL_TABLET | Freq: Every day | ORAL | Status: DC
  Administered 2022-05-30 – 2022-06-01 (×3): 40 mg via ORAL
  Filled 2022-05-30 (×3): qty 1

## 2022-05-30 NOTE — ED Triage Notes (Signed)
BIB EMS, developed rectal bleeding last night, 6 dark stools with clots. Pt is on Plavix. Similar event in June. 144/84-100-100% CBG 122

## 2022-05-30 NOTE — H&P (Signed)
History and Physical    Patient: [name] [name] GQQ:[id number] DOB: [birth year] DOA: 05/30/2022 DOS: the patient was seen and examined on 05/30/2022 PCP: [name], MD  Patient coming from: Home  Chief Complaint:  Chief Complaint  Patient presents with   Rectal Bleeding   HPI: [name] [name] is a 72 y.o. female with medical history significant of TIA, OSA, HLD, GERD. Presenting with rectal bleeding. She was in her normal state of health until last night around 9p. She reports that she had an episode of large bloody BM. She didn't have any chest pain, palpitations, dyspnea, lightheadedness or dizziness at the time. She was able to go to sleep, but woke up this morning with several more episodes of bloody BMs. She spoke with [facility] and they recommended that she come to the ED for evaluation. She denies any other aggravating or alleviating factors.    Review of Systems: As mentioned in the history of present illness. All other systems reviewed and are negative. Past Medical History:  Diagnosis Date   Acute lower GI bleeding 10/25/2021   Aortic atherosclerosis ([place]) 10/25/2021   Cancer ([place])    Diverticulosis of intestine with bleeding 10/25/2021   GERD (gastroesophageal reflux disease) 10/25/2021   Hyperlipidemia 10/25/2021   Macular degeneration    PONV (postoperative nausea and vomiting)    Past Surgical History:  Procedure Laterality Date   ABDOMINAL HYSTERECTOMY     BREAST EXCISIONAL BIOPSY Right    BREAST LUMPECTOMY WITH RADIOACTIVE SEED LOCALIZATION Right 01/18/2016   Procedure: RIGHT BREAST LUMPECTOMY WITH RADIOACTIVE SEED LOCALIZATION;  Surgeon: Erroll Luna, MD;  Location: [facility];  Service: General;  Laterality: Right;   BREAST SURGERY     BUBBLE STUDY  03/01/2022   Procedure: BUBBLE STUDY;  Surgeon: Rex Kras, DO;  Location: [facility];  Service: Cardiovascular;;   MASTECTOMY Left    PATENT FORAMEN OVALE(PFO) CLOSURE N/A 04/07/2022   Procedure: PATENT  FORAMEN OVALE(PFO) CLOSURE;  Surgeon: Adrian Prows, MD;  Location: [facility];  Service: Cardiovascular;  Laterality: N/A;   TEE WITHOUT CARDIOVERSION N/A 03/01/2022   Procedure: TRANSESOPHAGEAL ECHOCARDIOGRAM (TEE);  Surgeon: Rex Kras, DO;  Location: [facility];  Service: Cardiovascular;  Laterality: N/A;   TURBINATE REDUCTION     Social History:  reports that she has never smoked. She has never been exposed to tobacco smoke. She has never used smokeless tobacco. She reports that she does not drink alcohol and does not use drugs.  No Known Allergies  Family History  Problem Relation Age of Onset   Stomach cancer Father    Dementia Paternal Grandmother    Stroke Paternal Grandfather    BRCA 1/2 Neg Hx    Breast cancer Neg Hx    Sleep apnea Neg Hx     Prior to Admission medications   Medication Sig Start Date End Date Taking? Authorizing Provider  aspirin EC 81 MG tablet Take 1 tablet (81 mg total) by mouth daily. Swallow whole. 11/17/21 11/12/22 Yes Elodia Florence., MD  atorvastatin (LIPITOR) 40 MG tablet Take 1 tablet (40 mg total) by mouth at bedtime. 11/16/21 11/11/22 Yes Elodia Florence., MD  Budeson-Glycopyrrol-Formoterol (BREZTRI AEROSPHERE) 160-9-4.8 MCG/ACT AERO Inhale 2 puffs into the lungs in the morning and at bedtime. Patient taking differently: Inhale 1-2 puffs into the lungs in the morning and at bedtime. 05/23/22  Yes Maryjane Hurter, MD  clopidogrel (PLAVIX) 75 MG tablet Take 1 tablet (75 mg total) by  mouth daily. 04/25/22 07/24/22 Yes Tolia, Sunit, DO  ergocalciferol (VITAMIN D2) 1.25 MG (50000 UT) capsule Take 50,000 Units by mouth 2 (two) times a week.   Yes [provider]  famotidine (PEPCID) 20 MG tablet Take 1 tablet (20 mg total) by mouth 2 (two) times daily. 04/07/22   Adrian Prows, MD  fluticasone (FLONASE) 50 MCG/ACT nasal spray Place 1-2 sprays into both nostrils daily as needed (for seasonal allergies).    [provider]   hydrOXYzine (ATARAX) 25 MG tablet Take 1 tablet (25 mg total) by mouth every 6 (six) hours as needed for anxiety. 05/18/22   Cristie Hem, MD  OVER THE COUNTER MEDICATION Take 1 capsule by mouth daily. [place] with Astaxanthin capsules- Take 1 capsule by mouth every morning    [provider]  OVER THE COUNTER MEDICATION Take 1 capsule by mouth daily. Kelli Hope Essential-1 with Vitamin D3-2000 capsules- Take 1 capsule by mouth once a day after breakfast    [provider]  polyethylene glycol (MIRALAX / GLYCOLAX) 17 g packet Take 17 g by mouth daily. Patient taking differently: Take 17 g by mouth daily as needed for moderate constipation (with iron supplement). 10/30/21   Florencia Reasons, MD  Vitamin D, Ergocalciferol, (DRISDOL) 1.25 MG (50000 UNIT) CAPS capsule Take 50,000 Units by mouth 2 (two) times a week. Take 50,000 units by mouth every Wednesday and Saturday 11/07/21   [provider]  zolpidem (AMBIEN) 5 MG tablet Take 1 tablet (5 mg total) by mouth at bedtime as needed for sleep. 05/23/22   Maryjane Hurter, MD    Physical Exam: Vitals:   05/30/22 1111 05/30/22 1200 05/30/22 1215 05/30/22 1327  BP: (!) 148/93 134/74 131/71 110/63  Pulse: (!) 102 76 75 78  Resp: '16 15 15 14  '$ Temp: 97.7 F (36.5 C)     TempSrc: Oral     SpO2: 100% 98% 97% 98%  Height:       General: 72 y.o. female resting in bed in NAD Eyes: PERRL, normal sclera ENMT: Nares patent w/o discharge, orophaynx clear, dentition normal, ears w/o discharge/lesions/ulcers Neck: Supple, trachea midline Cardiovascular: RRR, +S1, S2, no m/g/r, equal pulses throughout Respiratory: CTABL, no w/r/r, normal WOB GI: BS+, ND, mild epigastric TTP, no masses noted, no organomegaly noted MSK: No e/c/c Neuro: A&O x 3, no focal deficits Psyc: Appropriate interaction and affect, calm/cooperative  Data Reviewed:  Results for orders placed or performed during the hospital  encounter of 05/30/22 (from the past 24 hour(s))  Comprehensive metabolic panel     Status: Abnormal   Collection Time: 05/30/22 11:18 AM  Result Value Ref Range   Sodium 137 135 - 145 mmol/L   Potassium 3.3 (L) 3.5 - 5.1 mmol/L   Chloride 107 98 - 111 mmol/L   CO2 21 (L) 22 - 32 mmol/L   Glucose, Bld 121 (H) 70 - 99 mg/dL   BUN 32 (H) 8 - 23 mg/dL   Creatinine, Ser 0.80 0.44 - 1.00 mg/dL   Calcium 8.7 (L) 8.9 - 10.3 mg/dL   Total Protein 6.7 6.5 - 8.1 g/dL   Albumin 4.1 3.5 - 5.0 g/dL   AST 24 15 - 41 U/L   ALT 31 0 - 44 U/L   Alkaline Phosphatase 60 38 - 126 U/L   Total Bilirubin 0.9 0.3 - 1.2 mg/dL   GFR, Estimated >60 >60 mL/min   Anion gap 9 5 - 15  CBC with  Differential     Status: Abnormal   Collection Time: 05/30/22 11:18 AM  Result Value Ref Range   WBC 16.8 (H) 4.0 - 10.5 K/uL   RBC 3.64 (L) 3.87 - 5.11 MIL/uL   Hemoglobin 10.4 (L) 12.0 - 15.0 g/dL   HCT 32.9 (L) 36.0 - 46.0 %   MCV 90.4 80.0 - 100.0 fL   MCH 28.6 26.0 - 34.0 pg   MCHC 31.6 30.0 - 36.0 g/dL   RDW 15.0 11.5 - 15.5 %   Platelets 400 150 - 400 K/uL   nRBC 0.0 0.0 - 0.2 %   Neutrophils Relative % 71 %   Neutro Abs 12.1 (H) 1.7 - 7.7 K/uL   Lymphocytes Relative 18 %   Lymphs Abs 3.0 0.7 - 4.0 K/uL   Monocytes Relative 6 %   Monocytes Absolute 1.0 0.1 - 1.0 K/uL   Eosinophils Relative 3 %   Eosinophils Absolute 0.5 0.0 - 0.5 K/uL   Basophils Relative 1 %   Basophils Absolute 0.1 0.0 - 0.1 K/uL   Immature Granulocytes 1 %   Abs Immature Granulocytes 0.08 (H) 0.00 - 0.07 K/uL  Protime-INR     Status: None   Collection Time: 05/30/22 11:18 AM  Result Value Ref Range   Prothrombin Time 13.7 11.4 - 15.2 seconds   INR 1.1 0.8 - 1.2  Type and screen [place]     Status: None   Collection Time: 05/30/22 11:18 AM  Result Value Ref Range   ABO/RH(D) AB POS    Antibody Screen NEG    Sample Expiration      06/02/2022,2359 Performed at [facility], [place]  [address], [place], [place] [address]   POC occult blood, ED     Status: Abnormal   Collection Time: 05/30/22 11:57 AM  Result Value Ref Range   Fecal Occult Bld POSITIVE (A) NEGATIVE   CXR: 1. Small to moderate-sized hiatal hernia. 2. Subsegmental atelectasis or scarring peripherally at the left lung base.  Assessment and Plan: GIB     - admit to inpt, tele     - trend q6h H&H; transfuse for Hgb < 7     - continue protonix     - NPO until eval'd by GI; [facility] onboard.   TIA HLD     - continue home regimen except for anti-plts for now when confirmed  GERD     - PPI  OSA     - Bipap qHS  Hypokalemia     - replace K+; check Mg2+  Leukocytosis     - check UA     - CXR negative     - no fevers     - reactive? trend  Advance Care Planning:   Code Status: FULL  Consults: [facility]  Family Communication: None at bedside  Severity of Illness: The appropriate patient status for this patient is OBSERVATION. Observation status is judged to be reasonable and necessary in order to provide the required intensity of service to ensure the patient's safety. The patient's presenting symptoms, physical exam findings, and initial radiographic and laboratory data in the context of their medical condition is felt to place them at decreased risk for further clinical deterioration. Furthermore, it is anticipated that the patient will be medically stable for discharge from the hospital within 2 midnights of admission.   Author: Jonnie Finner, DO 05/30/2022 2:28 PM  For on call review [email or web address].

## 2022-05-30 NOTE — Telephone Encounter (Signed)
Patient calling because she thought she had to have a bowel movement at 9:00 PM lastnight, and when she went to the restroom she noticed that she did not have to have a bowel movement, she had a heavy GI Bleed. She just wanted to let us know she is not going  to take her blood thinner until after she is seem by her doctor. She is going to call 911 because she is weak and unable to take herself to the doctor.

## 2022-05-30 NOTE — Progress Notes (Signed)
PT could not tolerate CPAP. Became very panicky and refused.

## 2022-05-30 NOTE — ED Provider Notes (Signed)
[name] [name] DEPT Provider Note   CSN: [id number] Arrival date & time: 05/30/22  1102     History  Chief Complaint  Patient presents with   Rectal Bleeding    [name] [name] is a 72 y.o. female.  HPI   72 year old female with medical history significant for acute lower diverticular GI bleeding, HLD, prior stroke on Plavix who presents to the emergency department with passage of large clots and bright red blood per rectum this morning.  The patient feels fatigued.  She has had an acute onset of large volume lower GI bleeding that started this morning.  Endorses mild 3 out of 10 abdominal cramping located diffusely throughout her abdomen.  The patient states that this feels similar to the last time she was admitted for lower GI bleeding in the past June.  She called her gastroenterologist office, Dr. Perley Jain office and was advised to present to the emergency department for further evaluation.    Home Medications Prior to Admission medications   Medication Sig Start Date End Date Taking? Authorizing Provider  albuterol (VENTOLIN HFA) 108 (90 Base) MCG/ACT inhaler Inhale 1-2 puffs into the lungs every 6 (six) hours as needed for wheezing or shortness of breath. 12/27/21   [provider]  aspirin EC 81 MG tablet Take 1 tablet (81 mg total) by mouth daily. Swallow whole. 11/17/21 11/12/22  Elodia Florence., MD  atorvastatin (LIPITOR) 40 MG tablet Take 1 tablet (40 mg total) by mouth at bedtime. 11/16/21 11/11/22  Elodia Florence., MD  Budeson-Glycopyrrol-Formoterol (BREZTRI AEROSPHERE) 160-9-4.8 MCG/ACT AERO Inhale 2 puffs into the lungs in the morning and at bedtime. 05/23/22   Maryjane Hurter, MD  clopidogrel (PLAVIX) 75 MG tablet Take 1 tablet (75 mg total) by mouth daily. 04/25/22 07/24/22  Tolia, Sunit, DO  ergocalciferol (VITAMIN D2) 1.25 MG (50000 UT) capsule Take 50,000 Units by mouth once a week.    [provider]  famotidine  (PEPCID) 20 MG tablet Take 1 tablet (20 mg total) by mouth 2 (two) times daily. 04/07/22   Adrian Prows, MD  fluticasone (FLONASE) 50 MCG/ACT nasal spray Place 1-2 sprays into both nostrils daily as needed (for seasonal allergies).    [provider]  hydrOXYzine (ATARAX) 25 MG tablet Take 1 tablet (25 mg total) by mouth every 6 (six) hours as needed for anxiety. 05/18/22   Cristie Hem, MD  OVER THE COUNTER MEDICATION Take 1 capsule by mouth daily. [place] with Astaxanthin capsules- Take 1 capsule by mouth every morning    [provider]  OVER THE COUNTER MEDICATION Take 1 capsule by mouth daily. Kelli Hope Essential-1 with Vitamin D3-2000 capsules- Take 1 capsule by mouth once a day after breakfast    [provider]  polyethylene glycol (MIRALAX / GLYCOLAX) 17 g packet Take 17 g by mouth daily. Patient taking differently: Take 17 g by mouth daily as needed for moderate constipation (with iron supplement). 10/30/21   Florencia Reasons, MD  Vitamin D, Ergocalciferol, (DRISDOL) 1.25 MG (50000 UNIT) CAPS capsule Take 50,000 Units by mouth 2 (two) times a week. Take 50,000 units by mouth every Wednesday and Saturday 11/07/21   [provider]  zolpidem (AMBIEN) 5 MG tablet Take 1 tablet (5 mg total) by mouth at bedtime as needed for sleep. 05/23/22   Maryjane Hurter, MD      Allergies    Patient has no known allergies.  Review of Systems   Review of Systems  All other systems reviewed and are negative.   Physical Exam Updated Vital Signs BP 110/63 (BP Location: Right Arm)   Pulse 78   Temp 97.7 F (36.5 C) (Oral)   Resp 14   Ht 5' 4.5" (1.638 m)   SpO2 98%   BMI 30.66 kg/m  Physical Exam Vitals and nursing note reviewed. Exam conducted with a chaperone present.  Constitutional:      General: She is not in acute distress.    Appearance: She is well-developed.  HENT:     Head: Normocephalic and atraumatic.  Eyes:      Conjunctiva/sclera: Conjunctivae normal.  Cardiovascular:     Rate and Rhythm: Regular rhythm. Tachycardia present.  Pulmonary:     Effort: Pulmonary effort is normal. No respiratory distress.     Breath sounds: Normal breath sounds.  Abdominal:     Palpations: Abdomen is soft.     Tenderness: There is no abdominal tenderness.  Genitourinary:    Comments: Bright red blood per rectum present Musculoskeletal:        General: No swelling.     Cervical back: Neck supple.  Skin:    General: Skin is warm and dry.     Capillary Refill: Capillary refill takes less than 2 seconds.  Neurological:     Mental Status: She is alert.  Psychiatric:        Mood and Affect: Mood normal.     ED Results / Procedures / Treatments   Labs (all labs ordered are listed, but only abnormal results are displayed) Labs Reviewed  COMPREHENSIVE METABOLIC PANEL - Abnormal; Notable for the following components:      Result Value   Potassium 3.3 (*)    CO2 21 (*)    Glucose, Bld 121 (*)    BUN 32 (*)    Calcium 8.7 (*)    All other components within normal limits  CBC WITH DIFFERENTIAL/PLATELET - Abnormal; Notable for the following components:   WBC 16.8 (*)    RBC 3.64 (*)    Hemoglobin 10.4 (*)    HCT 32.9 (*)    Neutro Abs 12.1 (*)    Abs Immature Granulocytes 0.08 (*)    All other components within normal limits  POC OCCULT BLOOD, ED - Abnormal; Notable for the following components:   Fecal Occult Bld POSITIVE (*)    All other components within normal limits  PROTIME-INR  TYPE AND SCREEN    EKG EKG Interpretation  Date/Time:  Tuesday May 30 2022 11:34:41 EST Ventricular Rate:  93 PR Interval:  152 QRS Duration: 78 QT Interval:  336 QTC Calculation: 418 R Axis:   -19 Text Interpretation: Sinus rhythm Borderline left axis deviation Low voltage, precordial leads Confirmed by Regan Lemming ([id number]) on 05/30/2022 12:51:48 PM  Radiology CT ANGIO GI BLEED  Result Date:  05/30/2022 CLINICAL DATA:  Mesenteric ischemia, acute rectal bleeding EXAM: CTA ABDOMEN AND PELVIS WITHOUT AND WITH CONTRAST TECHNIQUE: Multidetector CT imaging of the abdomen and pelvis was performed using the standard protocol during bolus administration of intravenous contrast. Multiplanar reconstructed images and MIPs were obtained and reviewed to evaluate the vascular anatomy. RADIATION DOSE REDUCTION: This exam was performed according to the departmental dose-optimization program which includes automated exposure control, adjustment of the mA and/or kV according to patient size and/or use of iterative reconstruction technique. CONTRAST:  136m OMNIPAQUE IOHEXOL 350 MG/ML SOLN COMPARISON:  CT 10/25/2021. FINDINGS: VASCULAR Aorta: Skin scattered  atherosclerosis. No significant stenosis. No aneurysm or dissection. Celiac: Patent without significant stenosis. SMA: Patent without significant stenosis. Renals: Patent without significant stenosis. IMA: Patent without significant stenosis. Inflow: Scattered atherosclerosis. No significant stenosis. No aneurysm or dissection. Proximal Outflow: Patent without significant stenosis. Veins: No obvious venous abnormality. Review of the MIP images confirms the above findings. NON-VASCULAR Lower chest: Left basilar scarring/linear atelectasis. No acute findings. Partially visualized left breast implant. Hepatobiliary: No focal liver abnormality is seen. The gallbladder is unremarkable. Pancreas: Unremarkable. No pancreatic ductal dilatation or surrounding inflammatory changes. Spleen: Normal in size without focal abnormality. Adrenals/Urinary Tract: Adrenal glands are unremarkable. No hydronephrosis or nephroureterolithiasis. Small exophytic left renal cyst which requires no follow-up imaging. Mildly distended urinary bladder. Stomach/Bowel: Moderate size hiatal hernia. There is no evidence of bowel obstruction.The appendix is normal. There is no evidence of active  gastrointestinal bleed. Sigmoid diverticulosis. Lymphatic: No lymphadenopathy. Reproductive: Prior hysterectomy. Other: No abdominal wall hernia or abnormality. No abdominopelvic ascites. Musculoskeletal: No acute osseous abnormality. Levoconvex lumbar curvature. Multilevel degenerative changes of the spine. No suspicious osseous lesion. IMPRESSION: No CT evidence of active gastrointestinal bleed No other acute findings in the abdomen or pelvis. Sigmoid diverticulosis without evidence of acute diverticulitis. Moderate-sized hiatal hernia. Electronically Signed   By: Maurine Simmering M.D.   On: 05/30/2022 13:04    Procedures .Critical Care  Performed by: Regan Lemming, MD Authorized by: Regan Lemming, MD   Critical care provider statement:    Critical care time (minutes):  30   Critical care was time spent personally by me on the following activities:  Development of treatment plan with patient or surrogate, discussions with consultants, evaluation of patient's response to treatment, examination of patient, ordering and review of laboratory studies, ordering and review of radiographic studies, ordering and performing treatments and interventions, pulse oximetry, re-evaluation of patient's condition and review of old charts   Care discussed with: admitting provider       Medications Ordered in ED Medications  sodium chloride 0.9 % bolus 1,000 mL (0 mLs Intravenous Stopped 05/30/22 1330)    And  0.9 %  sodium chloride infusion ( Intravenous New Bag/Given 05/30/22 1151)  sodium chloride (PF) 0.9 % injection (  Not Given 05/30/22 1306)  pantoprazole (PROTONIX) injection 40 mg (40 mg Intravenous Given 05/30/22 1138)  iohexol (OMNIPAQUE) 350 MG/ML injection 100 mL (100 mLs Intravenous Contrast Given 05/30/22 1235)    ED Course/ Medical Decision Making/ A&P                           Medical Decision Making Amount and/or Complexity of Data Reviewed Labs: ordered. Radiology: ordered.  Risk Prescription  drug management. Decision regarding hospitalization.    72 year old female with medical history significant for acute lower diverticular GI bleeding, HLD, prior stroke on Plavix who presents to the emergency department with passage of large clots and bright red blood per rectum this morning.  The patient feels fatigued.  She has had an acute onset of large volume lower GI bleeding that started this morning.  Endorses mild 3 out of 10 abdominal cramping located diffusely throughout her abdomen.  The patient states that this feels similar to the last time she was admitted for lower GI bleeding in the past June.  She called her gastroenterologist office, Dr. Perley Jain office and was advised to present to the emergency department for further evaluation.    On arrival, she was afebrile, tachycardic P102, normotensive to hypertensive,  saturating well on room air. Physical exam significant for bright blood per rectum, occult positive, mild generalized abdominal tenderness to palpation.  Large-bore IV access was obtained and the patient was administered an IV fluid bolus.  CBC was significant for a leukocytosis to 16.8, hemoglobin dropped to 10.4 from 13.  An IV Protonix bolus was ordered.  CT angio GI bleed protocol was ordered.  GI was consulted for further recommendations and management.  My primary suspicion is for recurrent lower GI bleed.  Lower suspicion for brisk upper GI bleed.  I spoke with Dr. Deno Etienne of on-call gastroenterology who recommended admission for observation, continuing trending of the patient's hemoglobin, follow-up on CT angiogram if active extra have, consult IR.  CT angiogram was performed and revealed the following: IMPRESSION:  No CT evidence of active gastrointestinal bleed    No other acute findings in the abdomen or pelvis.    Sigmoid diverticulosis without evidence of acute diverticulitis.    Moderate-sized hiatal hernia.    Hospitalist medicine consulted for  admission.   Final Clinical Impression(s) / ED Diagnoses Final diagnoses:  Acute GI bleeding  Lower GI bleed    Rx / DC Orders ED Discharge Orders     None         Regan Lemming, MD 05/30/22 1337

## 2022-05-31 DIAGNOSIS — G459 Transient cerebral ischemic attack, unspecified: Secondary | ICD-10-CM

## 2022-05-31 DIAGNOSIS — K922 Gastrointestinal hemorrhage, unspecified: Secondary | ICD-10-CM | POA: Diagnosis not present

## 2022-05-31 DIAGNOSIS — E782 Mixed hyperlipidemia: Secondary | ICD-10-CM | POA: Diagnosis not present

## 2022-05-31 DIAGNOSIS — E876 Hypokalemia: Secondary | ICD-10-CM

## 2022-05-31 LAB — CBC
HCT: 27.3 % — ABNORMAL LOW (ref 36.0–46.0)
Hemoglobin: 8.4 g/dL — ABNORMAL LOW (ref 12.0–15.0)
MCH: 28.4 pg (ref 26.0–34.0)
MCHC: 30.8 g/dL (ref 30.0–36.0)
MCV: 92.2 fL (ref 80.0–100.0)
Platelets: 345 10*3/uL (ref 150–400)
RBC: 2.96 MIL/uL — ABNORMAL LOW (ref 3.87–5.11)
RDW: 15.3 % (ref 11.5–15.5)
WBC: 9.7 10*3/uL (ref 4.0–10.5)
nRBC: 0 % (ref 0.0–0.2)

## 2022-05-31 LAB — COMPREHENSIVE METABOLIC PANEL
ALT: 27 U/L (ref 0–44)
AST: 19 U/L (ref 15–41)
Albumin: 3.2 g/dL — ABNORMAL LOW (ref 3.5–5.0)
Alkaline Phosphatase: 49 U/L (ref 38–126)
Anion gap: 6 (ref 5–15)
BUN: 16 mg/dL (ref 8–23)
CO2: 21 mmol/L — ABNORMAL LOW (ref 22–32)
Calcium: 7.9 mg/dL — ABNORMAL LOW (ref 8.9–10.3)
Chloride: 111 mmol/L (ref 98–111)
Creatinine, Ser: 0.65 mg/dL (ref 0.44–1.00)
GFR, Estimated: >60 mL/min (ref >60)
Glucose, Bld: 93 mg/dL (ref 70–99)
Potassium: 3.8 mmol/L (ref 3.5–5.1)
Sodium: 138 mmol/L (ref 135–145)
Total Bilirubin: 0.8 mg/dL (ref 0.3–1.2)
Total Protein: 5.6 g/dL — ABNORMAL LOW (ref 6.5–8.1)

## 2022-05-31 LAB — IRON AND TIBC
Iron: 36 ug/dL (ref 28–170)
Saturation Ratios: 9 % — ABNORMAL LOW (ref 10.4–31.8)
TIBC: 399 ug/dL (ref 250–450)
UIBC: 363 ug/dL

## 2022-05-31 LAB — HEMOGLOBIN AND HEMATOCRIT, BLOOD
HCT: 24.1 % — ABNORMAL LOW (ref 36.0–46.0)
Hemoglobin: 7.6 g/dL — ABNORMAL LOW (ref 12.0–15.0)

## 2022-05-31 LAB — VITAMIN B12: Vitamin B-12: 630 pg/mL (ref 180–914)

## 2022-05-31 LAB — RETICULOCYTES
Immature Retic Fract: 35.7 % — ABNORMAL HIGH (ref 2.3–15.9)
RBC.: 2.9 MIL/uL — ABNORMAL LOW (ref 3.87–5.11)
Retic Count, Absolute: 93.4 10*3/uL (ref 19.0–186.0)
Retic Ct Pct: 3.2 % — ABNORMAL HIGH (ref 0.4–3.1)

## 2022-05-31 LAB — FOLATE: Folate: 26 ng/mL (ref >5.9)

## 2022-05-31 LAB — FERRITIN: Ferritin: 10 ng/mL — ABNORMAL LOW (ref 11–307)

## 2022-05-31 MED ORDER — PSYLLIUM 95 % PO PACK
1.0000 | PACK | Freq: Three times a day (TID) | ORAL | Status: DC
  Administered 2022-05-31 – 2022-06-02 (×7): 1 via ORAL
  Filled 2022-05-31 (×7): qty 1

## 2022-05-31 MED ORDER — MIRTAZAPINE 15 MG PO TABS: 7.5000 mg | ORAL_TABLET | Freq: Every day | ORAL | Status: DC

## 2022-05-31 MED ORDER — SODIUM CHLORIDE 0.9 % IV SOLN
510.0000 mg | Freq: Once | INTRAVENOUS | Status: AC
  Administered 2022-05-31: 510 mg via INTRAVENOUS
  Filled 2022-05-31: qty 17

## 2022-05-31 MED ORDER — MIRTAZAPINE 15 MG PO TABS
7.5000 mg | ORAL_TABLET | Freq: Every day | ORAL | Status: DC
  Administered 2022-05-31 – 2022-06-01 (×2): 7.5 mg via ORAL
  Filled 2022-05-31 (×2): qty 1

## 2022-05-31 NOTE — Plan of Care (Signed)
Patient AOX3, disoriented to time, VSS throughout shift.  Denied pain and SOB.  All meds given on time as ordered.  Pt voided in bathroom, some blood clots seen from wiping afterwards.  No Bms this shift.  Diminished lungs, IS taught and encouraged.  Pt had some anxiety and restlessness, provider notified and benadryl ordered and given for relief.  POC maintained, will continue to monitor.  Problem: Education: Goal: Knowledge of General Education information will improve Description: Including pain rating scale, medication(s)/side effects and non-pharmacologic comfort measures Outcome: Progressing   Problem: Health Behavior/Discharge Planning: Goal: Ability to manage health-related needs will improve Outcome: Progressing   Problem: Clinical Measurements: Goal: Ability to maintain clinical measurements within normal limits will improve Outcome: Progressing Goal: Will remain free from infection Outcome: Progressing Goal: Diagnostic test results will improve Outcome: Progressing Goal: Respiratory complications will improve Outcome: Progressing Goal: Cardiovascular complication will be avoided Outcome: Progressing   Problem: Activity: Goal: Risk for activity intolerance will decrease Outcome: Progressing   Problem: Nutrition: Goal: Adequate nutrition will be maintained Outcome: Progressing   Problem: Coping: Goal: Level of anxiety will decrease Outcome: Progressing   Problem: Elimination: Goal: Will not experience complications related to bowel motility Outcome: Progressing Goal: Will not experience complications related to urinary retention Outcome: Progressing   Problem: Pain Managment: Goal: General experience of comfort will improve Outcome: Progressing   Problem: Safety: Goal: Ability to remain free from injury will improve Outcome: Progressing   Problem: Skin Integrity: Goal: Risk for impaired skin integrity will decrease Outcome: Progressing

## 2022-05-31 NOTE — Consult Note (Signed)
[facility] Consult  Referring Provider: ER Primary Care Physician:  [name], MD Primary Gastroenterologist: Dr. [facility]  Reason for Consultation: Hematochezia  HPI: [name] [name] is a 72 y.o. female was in her usual state of health until Monday evening, 2 days ago when she noticed passing large amount of bright red blood and maroon clots at around 4 PM. The next day between 7-8 am, she had 3 additional episodes of rectal bleeding and then within 8 to 9 AM had 3 more episodes of rectal bleeding which prompted her to call the office where she was instructed to go to the ER. Since admission she has noticed decreased bowel movements and noticing blood mostly on wiping. Patient has not had shortness of breath, chest pain or dizziness. She has not had any abdominal or rectal pain. 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 She is also on aspirin 81 mg a day. Last colonoscopy was 02/2018, Dr. Penelope Coop: Noted to have sigmoid diverticulosis. Patient reports having similar episode of rectal bleeding presumed to be related to diverticulosis in June 2023 which was managed conservatively and she required 2 unit PRBC transfusion. Patient has history of acid reflux for which she used to take Nexium and more recently has been on famotidine.  She denies difficulty swallowing or pain on swallowing. She denies change in appetite or weight loss. She takes Metamucil every other day and has a high-fiber diet.   Past Medical History:  Diagnosis Date   Acute lower GI bleeding 10/25/2021   Aortic atherosclerosis ([place]) 10/25/2021   Cancer ([place])    Diverticulosis of intestine with bleeding 10/25/2021   GERD (gastroesophageal reflux disease) 10/25/2021   Hyperlipidemia 10/25/2021   Macular degeneration    PONV (postoperative nausea and vomiting)     Past Surgical History:   Procedure Laterality Date   ABDOMINAL HYSTERECTOMY     BREAST EXCISIONAL BIOPSY Right    BREAST LUMPECTOMY WITH RADIOACTIVE SEED LOCALIZATION Right 01/18/2016   Procedure: RIGHT BREAST LUMPECTOMY WITH RADIOACTIVE SEED LOCALIZATION;  Surgeon: Erroll Luna, MD;  Location: [facility];  Service: General;  Laterality: Right;   BREAST SURGERY     BUBBLE STUDY  03/01/2022   Procedure: BUBBLE STUDY;  Surgeon: Rex Kras, DO;  Location: [facility];  Service: Cardiovascular;;   MASTECTOMY Left    PATENT FORAMEN OVALE(PFO) CLOSURE N/A 04/07/2022   Procedure: PATENT FORAMEN OVALE(PFO) CLOSURE;  Surgeon: Adrian Prows, MD;  Location: [facility];  Service: Cardiovascular;  Laterality: N/A;   TEE WITHOUT CARDIOVERSION N/A 03/01/2022   Procedure: TRANSESOPHAGEAL ECHOCARDIOGRAM (TEE);  Surgeon: Rex Kras, DO;  Location: [facility];  Service: Cardiovascular;  Laterality: N/A;   TURBINATE REDUCTION      Prior to Admission medications   Medication Sig Start Date End Date Taking? Authorizing Provider  aspirin EC 81 MG tablet Take 1 tablet (81 mg total) by mouth daily. Swallow whole. 11/17/21 11/12/22 Yes Elodia Florence., MD  atorvastatin (LIPITOR) 40 MG tablet Take 1 tablet (40 mg total) by mouth at bedtime. 11/16/21 11/11/22 Yes Elodia Florence., MD  Budeson-Glycopyrrol-Formoterol (BREZTRI AEROSPHERE) 160-9-4.8 MCG/ACT AERO Inhale 2 puffs into the lungs in the morning and at bedtime. Patient taking differently: Inhale 1-2 puffs into the lungs in the morning and at bedtime. 05/23/22  Yes Maryjane Hurter, MD  clopidogrel (PLAVIX) 75  MG tablet Take 1 tablet (75 mg total) by mouth daily. 04/25/22 07/24/22 Yes Tolia, Sunit, DO  famotidine (PEPCID) 20 MG tablet Take 1 tablet (20 mg total) by mouth 2 (two) times daily. 04/07/22  Yes Adrian Prows, MD  ferrous sulfate 325 (65 FE) MG tablet Take 325 mg by mouth every Monday, Wednesday, and Friday.   Yes [provider]   fluticasone (FLONASE) 50 MCG/ACT nasal spray Place 1 spray into both nostrils daily as needed for allergies or rhinitis.   Yes [provider]  folic acid (FOLVITE) 1 MG tablet Take 1 mg by mouth daily.   Yes [provider]  OVER THE COUNTER MEDICATION Take 1 capsule by mouth daily. [place] with Astaxanthin capsules- Take 1 capsule by mouth every morning   Yes [provider]  OVER THE COUNTER MEDICATION Take 1 capsule by mouth daily. Kelli Hope Essential-1 with Vitamin D3-2000 capsules- Take 1 capsule by mouth once a day after breakfast   Yes [provider]  polyethylene glycol (MIRALAX / GLYCOLAX) 17 g packet Take 17 g by mouth daily. Patient taking differently: Take 17 g by mouth daily as needed for moderate constipation (with iron supplement). 10/30/21  Yes Florencia Reasons, MD  predniSONE (DELTASONE) 10 MG tablet Take 10-40 mg by mouth See admin instructions. Take 40 mg by mouth once a day with food for 3 days, 30 mg once a day for 3 days, 20 mg once a day for 3 days, 10 mg once a day for 3 days, then d/c 05/23/22 06/04/22 Yes [provider]  Vitamin D, Ergocalciferol, (DRISDOL) 1.25 MG (50000 UNIT) CAPS capsule Take 50,000 Units by mouth See admin instructions. Take 50,000 units by mouth every Wednesday and Saturday 11/07/21  Yes [provider]  zolpidem (AMBIEN) 5 MG tablet Take 1 tablet (5 mg total) by mouth at bedtime as needed for sleep. Patient taking differently: Take 5 mg by mouth at bedtime. 05/23/22  Yes Maryjane Hurter, MD  hydrOXYzine (ATARAX) 25 MG tablet Take 1 tablet (25 mg total) by mouth every 6 (six) hours as needed for anxiety. Patient not taking: Reported on 05/30/2022 05/18/22   Cristie Hem, MD    Current Facility-Administered Medications  Medication Dose Route Frequency Provider Last Rate Last Admin   0.9 %  sodium chloride infusion   Intravenous Continuous Rai, Ripudeep K, MD 125  mL/hr at 05/31/22 0834 Infusion Verify at 05/31/22 0834   atorvastatin (LIPITOR) tablet 40 mg  40 mg Oral QHS Kyle, Tyrone A, DO   40 mg at 05/30/22 2219   fluticasone (FLONASE) 50 MCG/ACT nasal spray 1 spray  1 spray Each Nare Daily PRN Marylyn Ishihara, Tyrone A, DO       mirtazapine (REMERON) tablet 7.5 mg  7.5 mg Oral QHS Rai, Ripudeep K, MD       mometasone-formoterol (DULERA) 100-5 MCG/ACT inhaler 2 puff  2 puff Inhalation BID Marylyn Ishihara, Tyrone A, DO   2 puff at 05/31/22 1610   And   umeclidinium bromide (INCRUSE ELLIPTA) 62.5 MCG/ACT 1 puff  1 puff Inhalation Daily Kyle, Tyrone A, DO   1 puff at 05/31/22 0854   pantoprazole (PROTONIX) injection 40 mg  40 mg Intravenous Q12H Kyle, Tyrone A, DO   40 mg at 05/30/22 2219   psyllium (HYDROCIL/METAMUCIL) 1 packet  1 packet Oral TID Ronnette Juniper, MD       zolpidem Lorrin Mais) tablet 5 mg  5 mg Oral QHS Cherylann Ratel  A, DO   5 mg at 05/30/22 2219    Allergies as of 05/30/2022 - Review Complete 05/30/2022  Allergen Reaction Noted   Hydroxyzine Anxiety 05/30/2022    Family History  Problem Relation Age of Onset   Stomach cancer Father    Dementia Paternal Grandmother    Stroke Paternal Grandfather    BRCA 1/2 Neg Hx    Breast cancer Neg Hx    Sleep apnea Neg Hx     Social History   Socioeconomic History   Marital status: Widowed    Spouse name: Not on file   Number of children: 1   Years of education: Not on file   Highest education level: Not on file  Occupational History   Not on file  Tobacco Use   Smoking status: Never    Passive exposure: Never   Smokeless tobacco: Never  Vaping Use   Vaping Use: Never used  Substance and Sexual Activity   Alcohol use: No   Drug use: No   Sexual activity: Not on file  Other Topics Concern   Not on file  Social History Narrative   Not on file   Social Determinants of Health   Financial Resource Strain: Not on file  Food Insecurity: No Food Insecurity (05/30/2022)   Hunger Vital Sign    Worried About  Running Out of Food in the Last Year: Never true    Ran Out of Food in the Last Year: Never true  Transportation Needs: No Transportation Needs (05/30/2022)   PRAPARE - Hydrologist (Medical): No    Lack of Transportation (Non-Medical): No  Physical Activity: Not on file  Stress: Not on file  Social Connections: Not on file  Intimate Partner Violence: Not At Risk (05/30/2022)   Humiliation, Afraid, Rape, and Kick questionnaire    Fear of Current or Ex-Partner: No    Emotionally Abused: No    Physically Abused: No    Sexually Abused: No    Review of Systems:  GI: Described in detail in HPI.    Gen: Denies any fever, chills, rigors, night sweats, anorexia, fatigue, weakness, malaise, involuntary weight loss, and sleep disorder CV: Denies chest pain, angina, palpitations, syncope, orthopnea, PND, peripheral edema, and claudication. Resp: Denies dyspnea, cough, sputum, wheezing, coughing up blood. GU : Denies urinary burning, blood in urine, urinary frequency, urinary hesitancy, nocturnal urination, and urinary incontinence. MS: Denies joint pain or swelling.  Denies muscle weakness, cramps, atrophy.  Derm: Denies rash, itching, oral ulcerations, hives, unhealing ulcers.  Psych: Denies depression, anxiety, memory loss, suicidal ideation, hallucinations,  and confusion. Heme: Denies bruising and enlarged lymph nodes. Neuro:  Denies any headaches, dizziness, paresthesias. Endo:  Denies any problems with DM, thyroid, adrenal function.  Physical Exam: Vital signs in last 24 hours: Temp:  [97.7 F (36.5 C)-97.9 F (36.6 C)] 97.8 F (36.6 C) (01/10 0558) Pulse Rate:  [75-102] 92 (01/10 0558) Resp:  [14-18] 18 (01/10 0558) BP: (106-148)/(63-93) 110/64 (01/10 0558) SpO2:  [96 %-100 %] 97 % (01/10 0853) Weight:  [83.2 kg] 83.2 kg (01/09 1605) Last BM Date : 05/30/22  General:   Alert,  Well-developed, well-nourished, pleasant and cooperative in NAD Head:   Normocephalic and atraumatic. Eyes:  Sclera clear, no icterus.   Prominent pallor  Ears:  Normal auditory acuity. Nose:  No deformity, discharge,  or lesions. Mouth:  No deformity or lesions.  Oropharynx pink & moist. Neck:  Supple; no masses or thyromegaly. Lungs:  Clear throughout to auscultation.   No wheezes, crackles, or rhonchi. No acute distress. Heart:  Regular rate and rhythm; no murmurs, clicks, rubs,  or gallops. Extremities:  Without clubbing or edema. Neurologic:  Alert and  oriented x4;  grossly normal neurologically. Skin:  Intact without significant lesions or rashes. Psych:  Alert and cooperative. Normal mood and affect. Abdomen:  Soft, nontender and nondistended. No masses, hepatosplenomegaly or hernias noted. Normal bowel sounds, without guarding, and without rebound.         Lab Results: Recent Labs    05/30/22 1118 05/30/22 1754 05/30/22 2358 05/31/22 0617  WBC 16.8*  --   --  9.7  HGB 10.4* 8.4* 7.6* 8.4*  HCT 32.9* 26.7* 24.1* 27.3*  PLT 400  --   --  345   BMET Recent Labs    05/30/22 1118 05/31/22 0617  NA 137 138  K 3.3* 3.8  CL 107 111  CO2 21* 21*  GLUCOSE 121* 93  BUN 32* 16  CREATININE 0.80 0.65  CALCIUM 8.7* 7.9*   LFT Recent Labs    05/31/22 0617  PROT 5.6*  ALBUMIN 3.2*  AST 19  ALT 27  ALKPHOS 49  BILITOT 0.8   PT/INR Recent Labs    05/30/22 1118  LABPROT 13.7  INR 1.1    Studies/Results: DG Chest 1 View  Result Date: 05/30/2022 CLINICAL DATA:  Leukocytosis, GI bleeding, abdominal pain. EXAM: CHEST  1 VIEW COMPARISON:  05/17/2022 FINDINGS: Atherosclerotic calcification of the aortic arch. Small to moderate-sized hiatal hernia. Upper normal heart size. Subsegmental atelectasis or scarring peripherally at the left lung base. The lungs appear otherwise clear. IMPRESSION: 1. Small to moderate-sized hiatal hernia. 2. Subsegmental atelectasis or scarring peripherally at the left lung base. Electronically Signed   By: Van Clines M.D.   On: 05/30/2022 14:25   CT ANGIO GI BLEED  Result Date: 05/30/2022 CLINICAL DATA:  Mesenteric ischemia, acute rectal bleeding EXAM: CTA ABDOMEN AND PELVIS WITHOUT AND WITH CONTRAST TECHNIQUE: Multidetector CT imaging of the abdomen and pelvis was performed using the standard protocol during bolus administration of intravenous contrast. Multiplanar reconstructed images and MIPs were obtained and reviewed to evaluate the vascular anatomy. RADIATION DOSE REDUCTION: This exam was performed according to the departmental dose-optimization program which includes automated exposure control, adjustment of the mA and/or kV according to patient size and/or use of iterative reconstruction technique. CONTRAST:  157m OMNIPAQUE IOHEXOL 350 MG/ML SOLN COMPARISON:  CT 10/25/2021. FINDINGS: VASCULAR Aorta: Skin scattered atherosclerosis. No significant stenosis. No aneurysm or dissection. Celiac: Patent without significant stenosis. SMA: Patent without significant stenosis. Renals: Patent without significant stenosis. IMA: Patent without significant stenosis. Inflow: Scattered atherosclerosis. No significant stenosis. No aneurysm or dissection. Proximal Outflow: Patent without significant stenosis. Veins: No obvious venous abnormality. Review of the MIP images confirms the above findings. NON-VASCULAR Lower chest: Left basilar scarring/linear atelectasis. No acute findings. Partially visualized left breast implant. Hepatobiliary: No focal liver abnormality is seen. The gallbladder is unremarkable. Pancreas: Unremarkable. No pancreatic ductal dilatation or surrounding inflammatory changes. Spleen: Normal in size without focal abnormality. Adrenals/Urinary Tract: Adrenal glands are unremarkable. No hydronephrosis or nephroureterolithiasis. Small exophytic left renal cyst which requires no follow-up imaging. Mildly distended urinary bladder. Stomach/Bowel: Moderate size hiatal hernia. There is no evidence of bowel  obstruction.The appendix is normal. There is no evidence of active gastrointestinal bleed. Sigmoid diverticulosis. Lymphatic: No lymphadenopathy. Reproductive: Prior hysterectomy. Other: No abdominal wall hernia or abnormality. No abdominopelvic ascites. Musculoskeletal: No acute osseous  abnormality. Levoconvex lumbar curvature. Multilevel degenerative changes of the spine. No suspicious osseous lesion. IMPRESSION: No CT evidence of active gastrointestinal bleed No other acute findings in the abdomen or pelvis. Sigmoid diverticulosis without evidence of acute diverticulitis. Moderate-sized hiatal hernia. Electronically Signed   By: Maurine Simmering M.D.   On: 05/30/2022 13:04    Impression: Painless hematochezia with sigmoid diverticulosis CT angio on admission, 05/30/2022 did not show active gastrointestinal bleeding Hemoglobin 10.4 on admission which dropped down to 8.4 and then to 7.6 and is now 8.4 without blood transfusion  Plan: Presumed diverticular bleed which appears to have slowed down and possibly resolved.  Last dose of Plavix was on Monday, 2 days ago.  Discussed about expectant management, trending H&H, clinically monitoring for recurrent hematochezia, holding antiplatelet for at least 5 days, versus colonoscopy with the patient.  Since she has not required blood transfusions, remains hemodynamically stable and appears to have resolving or improved hematochezia, Plavix on hold for only 2 days, recommend expectant management.  If clinical improvement noted, plan colonoscopy towards end of February 2024 as an outpatient when she can be completely taken off Plavix.  Start full liquid diet.  I have ordered Metamucil 3 times a day for now.  Will follow clinical course and H&H in a.m..   LOS: 1 day   Ronnette Juniper, MD  05/31/2022, 10:46 AM

## 2022-05-31 NOTE — Progress Notes (Signed)
[facility]                                                                              [name] [name], is a 72 y.o. female, DOB - [birth year], YQM:[id number] Admit date - 05/30/2022    Outpatient Primary MD for the patient is [name], MD  LOS - 1  days  Chief Complaint  Patient presents with   Rectal Bleeding       Brief summary   Patient is a 72 year old female with history of TIA, OSA, HLD, GERD presented with rectal bleeding.  Patient reported that she was in her normal state of health until the night before the admission on 1/9.  She had an episode of bloody bowel movement no chest pain, palpitations, dyspnea lightheadedness or dizziness at the time.  She was able to go to sleep but woke up on the morning of admission several more episodes of bloody BMs.  She spoke with [facility] and both recommended to come to the ED for evaluation.  No abdominal pain. In ED, initial hemoglobin 10.4 trended down to 7.6.  Previous hemoglobin on 05/17/2022 was 13.1. BMET remarkable.   Assessment & Plan    Principal Problem: GIB (gastrointestinal bleeding), acute blood loss anemia Iron deficiency anemia -Hemoglobin 10.4 on admission, trended down to 7.6 -This a.m. hemoglobin 8.4, continue to monitor closely, transfuse for hemoglobin <7.5 -Anemia panel showed ferritin 10, percent saturation ratio 9, will place on IV iron replacement x 1 -GI consulted, Plavix currently held, last dose on Monday, 1/8 -Recommended to continue holding Plavix, serial H&H, if clinical improvement, then plan colonoscopy outpatient. -Started on full liquid diet  Active Problems:   TIA (transient ischemic attack) -Plavix currently on hold due to #1    GERD (gastroesophageal reflux disease) -Continue PPI    Hyperlipidemia -Continue statin    OSA (obstructive sleep apnea) -CPAP nightly.  Patient reports that she was not able to tolerate CPAP last night due to anxiety     Hypokalemia -Resolved    Leukocytosis -Likely reactive, WBCs improved today  Anxiety, insomnia -Per patient, mostly at night, anxiety inr last few months - started on Remeron 7.5 mg daily   Obesity Estimated body mass index is 31 kg/m as calculated from the following:   Height as of this encounter: 5' 4.5" (1.638 m).   Weight as of this encounter: 83.2 kg.  Code Status: Full code DVT Prophylaxis:  SCDs Start: 05/30/22 1548   Level of Care: Level of care: Telemetry Family Communication: Updated patient Disposition Plan:      Remains inpatient appropriate: Workup in progress   Procedures:  None   Consultants:   GI  Antimicrobials: None  Medications  atorvastatin  40 mg Oral QHS   mirtazapine  7.5 mg Oral QHS   mometasone-formoterol  2 puff Inhalation BID   And   umeclidinium bromide  1 puff Inhalation Daily   pantoprazole (PROTONIX) IV  40 mg Intravenous Q12H   psyllium  1 packet Oral TID   zolpidem  5 mg Oral QHS      Subjective:   [name] was seen and examined  today.  Per patient, no acute nausea or vomiting.  She had some blood clots after wiping last night.  No BM this morning.  Not able to tolerate CPAP last night due to anxiety.  Objective:   Vitals:   05/30/22 2047 05/31/22 0032 05/31/22 0558 05/31/22 0853  BP: 106/64 117/72 110/64   Pulse: 82 81 92   Resp: '17 18 18   '$ Temp: 97.9 F (36.6 C) 97.9 F (36.6 C) 97.8 F (36.6 C)   TempSrc: Oral Oral Oral   SpO2: 96% 96% 98% 97%  Weight:      Height:        Intake/Output Summary (Last 24 hours) at 05/31/2022 1455 Last data filed at 05/31/2022 0834 Gross per 24 hour  Intake 2400 ml  Output 1000 ml  Net 1400 ml     Wt Readings from Last 3 Encounters:  05/30/22 83.2 kg  05/23/22 82.3 kg  05/23/22 82.1 kg     Exam General: Alert and oriented x 3, NAD Cardiovascular: S1 S2 auscultated,  RRR Respiratory: Clear to auscultation bilaterally, no wheezing Gastrointestinal: Soft, nontender,  nondistended, + bowel sounds Ext: no pedal edema bilaterally Neuro: no new deficits Psych: Normal affect and demeanor, alert and oriented x3     Data Reviewed:  I have personally reviewed following labs    CBC Lab Results  Component Value Date   WBC 9.7 05/31/2022   RBC 2.90 (L) 05/31/2022   HGB 8.4 (L) 05/31/2022   HCT 27.3 (L) 05/31/2022   MCV 92.2 05/31/2022   MCH 28.4 05/31/2022   PLT 345 05/31/2022   MCHC 30.8 05/31/2022   RDW 15.3 05/31/2022   LYMPHSABS 3.0 05/30/2022   MONOABS 1.0 05/30/2022   EOSABS 0.5 05/30/2022   BASOSABS 0.1 81/44/8185     Last metabolic panel Lab Results  Component Value Date   NA 138 05/31/2022   K 3.8 05/31/2022   CL 111 05/31/2022   CO2 21 (L) 05/31/2022   BUN 16 05/31/2022   CREATININE 0.65 05/31/2022   GLUCOSE 93 05/31/2022   GFRNONAA >60 05/31/2022   CALCIUM 7.9 (L) 05/31/2022   PHOS 4.0 11/15/2021   PROT 5.6 (L) 05/31/2022   ALBUMIN 3.2 (L) 05/31/2022   LABGLOB 2.3 04/25/2022   AGRATIO 2.0 04/25/2022   BILITOT 0.8 05/31/2022   ALKPHOS 49 05/31/2022   AST 19 05/31/2022   ALT 27 05/31/2022   ANIONGAP 6 05/31/2022    CBG (last 3)  No results for input(s): "GLUCAP" in the last 72 hours.    Coagulation Profile: Recent Labs  Lab 05/30/22 1118  INR 1.1     Radiology Studies: I have personally reviewed the imaging studies  DG Chest 1 View  Result Date: 05/30/2022 CLINICAL DATA:  Leukocytosis, GI bleeding, abdominal pain. EXAM: CHEST  1 VIEW COMPARISON:  05/17/2022 FINDINGS: Atherosclerotic calcification of the aortic arch. Small to moderate-sized hiatal hernia. Upper normal heart size. Subsegmental atelectasis or scarring peripherally at the left lung base. The lungs appear otherwise clear. IMPRESSION: 1. Small to moderate-sized hiatal hernia. 2. Subsegmental atelectasis or scarring peripherally at the left lung base. Electronically Signed   By: Van Clines M.D.   On: 05/30/2022 14:25   CT ANGIO GI  BLEED  Result Date: 05/30/2022 CLINICAL DATA:  Mesenteric ischemia, acute rectal bleeding EXAM: CTA ABDOMEN AND PELVIS WITHOUT AND WITH CONTRAST TECHNIQUE: Multidetector CT imaging of the abdomen and pelvis was performed using the standard protocol during bolus administration of intravenous contrast. Multiplanar reconstructed images  and MIPs were obtained and reviewed to evaluate the vascular anatomy. RADIATION DOSE REDUCTION: This exam was performed according to the departmental dose-optimization program which includes automated exposure control, adjustment of the mA and/or kV according to patient size and/or use of iterative reconstruction technique. CONTRAST:  167m OMNIPAQUE IOHEXOL 350 MG/ML SOLN COMPARISON:  CT 10/25/2021. FINDINGS: VASCULAR Aorta: Skin scattered atherosclerosis. No significant stenosis. No aneurysm or dissection. Celiac: Patent without significant stenosis. SMA: Patent without significant stenosis. Renals: Patent without significant stenosis. IMA: Patent without significant stenosis. Inflow: Scattered atherosclerosis. No significant stenosis. No aneurysm or dissection. Proximal Outflow: Patent without significant stenosis. Veins: No obvious venous abnormality. Review of the MIP images confirms the above findings. NON-VASCULAR Lower chest: Left basilar scarring/linear atelectasis. No acute findings. Partially visualized left breast implant. Hepatobiliary: No focal liver abnormality is seen. The gallbladder is unremarkable. Pancreas: Unremarkable. No pancreatic ductal dilatation or surrounding inflammatory changes. Spleen: Normal in size without focal abnormality. Adrenals/Urinary Tract: Adrenal glands are unremarkable. No hydronephrosis or nephroureterolithiasis. Small exophytic left renal cyst which requires no follow-up imaging. Mildly distended urinary bladder. Stomach/Bowel: Moderate size hiatal hernia. There is no evidence of bowel obstruction.The appendix is normal. There is no evidence  of active gastrointestinal bleed. Sigmoid diverticulosis. Lymphatic: No lymphadenopathy. Reproductive: Prior hysterectomy. Other: No abdominal wall hernia or abnormality. No abdominopelvic ascites. Musculoskeletal: No acute osseous abnormality. Levoconvex lumbar curvature. Multilevel degenerative changes of the spine. No suspicious osseous lesion. IMPRESSION: No CT evidence of active gastrointestinal bleed No other acute findings in the abdomen or pelvis. Sigmoid diverticulosis without evidence of acute diverticulitis. Moderate-sized hiatal hernia. Electronically Signed   By: JMaurine SimmeringM.D.   On: 05/30/2022 13:04       Keora Eccleston M.D. [facility] 05/31/2022, 2:55 PM  Available via Epic secure chat 7am-7pm After 7 pm, please refer to night coverage provider listed on amion.

## 2022-06-01 DIAGNOSIS — Q2112 Patent foramen ovale: Secondary | ICD-10-CM | POA: Diagnosis not present

## 2022-06-01 DIAGNOSIS — G459 Transient cerebral ischemic attack, unspecified: Secondary | ICD-10-CM | POA: Diagnosis not present

## 2022-06-01 DIAGNOSIS — K922 Gastrointestinal hemorrhage, unspecified: Secondary | ICD-10-CM | POA: Diagnosis not present

## 2022-06-01 DIAGNOSIS — Z8774 Personal history of (corrected) congenital malformations of heart and circulatory system: Secondary | ICD-10-CM

## 2022-06-01 LAB — CBC
HCT: 25.1 % — ABNORMAL LOW (ref 36.0–46.0)
Hemoglobin: 7.7 g/dL — ABNORMAL LOW (ref 12.0–15.0)
MCH: 28.4 pg (ref 26.0–34.0)
MCHC: 30.7 g/dL (ref 30.0–36.0)
MCV: 92.6 fL (ref 80.0–100.0)
Platelets: 294 10*3/uL (ref 150–400)
RBC: 2.71 MIL/uL — ABNORMAL LOW (ref 3.87–5.11)
RDW: 15.4 % (ref 11.5–15.5)
WBC: 6.4 10*3/uL (ref 4.0–10.5)
nRBC: 0 % (ref 0.0–0.2)

## 2022-06-01 LAB — HEMOGLOBIN AND HEMATOCRIT, BLOOD
HCT: 25.1 % — ABNORMAL LOW (ref 36.0–46.0)
Hemoglobin: 7.7 g/dL — ABNORMAL LOW (ref 12.0–15.0)

## 2022-06-01 LAB — BASIC METABOLIC PANEL
Anion gap: 6 (ref 5–15)
BUN: 6 mg/dL — ABNORMAL LOW (ref 8–23)
CO2: 21 mmol/L — ABNORMAL LOW (ref 22–32)
Calcium: 7.9 mg/dL — ABNORMAL LOW (ref 8.9–10.3)
Chloride: 113 mmol/L — ABNORMAL HIGH (ref 98–111)
Creatinine, Ser: 0.68 mg/dL (ref 0.44–1.00)
GFR, Estimated: >60 mL/min (ref >60)
Glucose, Bld: 114 mg/dL — ABNORMAL HIGH (ref 70–99)
Potassium: 3.2 mmol/L — ABNORMAL LOW (ref 3.5–5.1)
Sodium: 140 mmol/L (ref 135–145)

## 2022-06-01 LAB — PREPARE RBC (CROSSMATCH)

## 2022-06-01 MED ORDER — PSYLLIUM 95 % PO PACK: 1.0000 | PACK | Freq: Three times a day (TID) | ORAL | 2 refills | Status: DC

## 2022-06-01 MED ORDER — PANTOPRAZOLE SODIUM 40 MG PO TBEC
40.0000 mg | DELAYED_RELEASE_TABLET | Freq: Every day | ORAL | Status: DC
  Administered 2022-06-02: 40 mg via ORAL
  Filled 2022-06-01: qty 1

## 2022-06-01 MED ORDER — POTASSIUM CHLORIDE CRYS ER 20 MEQ PO TBCR
40.0000 meq | EXTENDED_RELEASE_TABLET | Freq: Once | ORAL | Status: AC
  Administered 2022-06-01: 40 meq via ORAL
  Filled 2022-06-01: qty 2

## 2022-06-01 MED ORDER — PANTOPRAZOLE SODIUM 40 MG PO TBEC: 40.0000 mg | DELAYED_RELEASE_TABLET | Freq: Every day | ORAL | 3 refills | Status: DC

## 2022-06-01 MED ORDER — MIRTAZAPINE 7.5 MG PO TABS: 7.5000 mg | ORAL_TABLET | Freq: Every day | ORAL | 1 refills | Status: DC

## 2022-06-01 MED ORDER — CLOPIDOGREL BISULFATE 75 MG PO TABS: 75.0000 mg | ORAL_TABLET | Freq: Every day | ORAL | 0 refills | Status: DC

## 2022-06-01 MED ORDER — SODIUM CHLORIDE 0.9% IV SOLUTION: Freq: Once | INTRAVENOUS | Status: DC

## 2022-06-01 NOTE — Progress Notes (Signed)
Mobility Specialist - Progress Note   06/01/22 1250  Mobility  Activity Ambulated with assistance in hallway  Level of Assistance Modified independent, requires aide device or extra time  Assistive Device Other (Comment) (iv pole)  Distance Ambulated (ft) 75 ft  Activity Response Tolerated well  Mobility Referral Yes  $Mobility charge 1 Mobility   Pt received in bed and agreed to mobility, session cut short because pt was eager to eat. Pt returned to chair with all needs met.   Roderick Pee Mobility Specialist

## 2022-06-01 NOTE — Progress Notes (Signed)
Subjective: Patient has noted some dried blood on wiping but has not had a bowel movement since admission. She states she slept the best last night after receiving an Ambien and feels a little drowsy today but denies shortness of breath, chest pain or dizziness. She denies abdominal pain.  Objective: Vital signs in last 24 hours: Temp:  [97.7 F (36.5 C)-98.3 F (36.8 C)] 97.7 F (36.5 C) (01/11 0432) Pulse Rate:  [84-88] 87 (01/11 0432) Resp:  [18-20] 18 (01/11 0432) BP: (117-122)/(62-66) 117/66 (01/11 0432) SpO2:  [95 %-98 %] 95 % (01/11 1005) Weight change:  Last BM Date : 05/30/22  PE: Sitting comfortably on bed, mild pallor GENERAL: Not not in distress  ABDOMEN: Soft, nondistended, nontender, normoactive bowel sounds EXTREMITIES: No deformity  Lab Results: Results for orders placed or performed during the hospital encounter of 05/30/22 (from the past 48 hour(s))  Magnesium     Status: None   Collection Time: 05/30/22  2:49 PM  Result Value Ref Range   Magnesium 2.3 1.7 - 2.4 mg/dL    Comment: Performed at [facility], [place] [address], [place], [place] [address]  Hemoglobin and hematocrit, blood     Status: Abnormal   Collection Time: 05/30/22  5:54 PM  Result Value Ref Range   Hemoglobin 8.4 (L) 12.0 - 15.0 g/dL   HCT 26.7 (L) 36.0 - 46.0 %    Comment: Performed at [facility], [place] [address] [place], [place] [address]  Hemoglobin and hematocrit, blood     Status: Abnormal   Collection Time: 05/30/22 11:58 PM  Result Value Ref Range   Hemoglobin 7.6 (L) 12.0 - 15.0 g/dL   HCT 24.1 (L) 36.0 - 46.0 %    Comment: Performed at [facility], [place] [address] [place], [place] [address]  Comprehensive metabolic panel     Status: Abnormal   Collection Time: 05/31/22  6:17 AM  Result Value Ref Range   Sodium 138 135 - 145 mmol/L   Potassium 3.8 3.5 - 5.1 mmol/L   Chloride 111 98 - 111 mmol/L   CO2 21 (L) 22 - 32  mmol/L   Glucose, Bld 93 70 - 99 mg/dL    Comment: Glucose reference range applies only to samples taken after fasting for at least 8 hours.   BUN 16 8 - 23 mg/dL   Creatinine, Ser 0.65 0.44 - 1.00 mg/dL   Calcium 7.9 (L) 8.9 - 10.3 mg/dL   Total Protein 5.6 (L) 6.5 - 8.1 g/dL   Albumin 3.2 (L) 3.5 - 5.0 g/dL   AST 19 15 - 41 U/L   ALT 27 0 - 44 U/L   Alkaline Phosphatase 49 38 - 126 U/L   Total Bilirubin 0.8 0.3 - 1.2 mg/dL   GFR, Estimated >60 >60 mL/min    Comment: (NOTE) Calculated using the CKD-EPI Creatinine Equation (2021)    Anion gap 6 5 - 15    Comment: Performed at [facility], [place] [address] [place], [place] [address]  CBC     Status: Abnormal   Collection Time: 05/31/22  6:17 AM  Result Value Ref Range   WBC 9.7 4.0 - 10.5 K/uL   RBC 2.96 (L) 3.87 - 5.11 MIL/uL   Hemoglobin 8.4 (L) 12.0 - 15.0 g/dL   HCT 27.3 (L) 36.0 - 46.0 %   MCV 92.2 80.0 - 100.0 fL   MCH 28.4 26.0 - 34.0 pg   MCHC 30.8 30.0 -  36.0 g/dL   RDW 15.3 11.5 - 15.5 %   Platelets 345 150 - 400 K/uL   nRBC 0.0 0.0 - 0.2 %    Comment: Performed at [facility], [place] [address], [place], [place] [address]  Vitamin B12     Status: None   Collection Time: 05/31/22  9:53 AM  Result Value Ref Range   Vitamin B-12 630 180 - 914 pg/mL    Comment: (NOTE) This assay is not validated for testing neonatal or myeloproliferative syndrome specimens for Vitamin B12 levels. Performed at [facility], [place] [address], [place], [place] [address]   Folate     Status: None   Collection Time: 05/31/22  9:53 AM  Result Value Ref Range   Folate 26.0 >5.9 ng/mL    Comment: RESULT CONFIRMED BY MANUAL DILUTION Performed at [place] [address] [place], [place] [address]   Iron and TIBC     Status: Abnormal   Collection Time: 05/31/22  9:53 AM  Result Value Ref Range   Iron 36 28 - 170 ug/dL   TIBC 399 250 - 450 ug/dL   Saturation  Ratios 9 (L) 10.4 - 31.8 %   UIBC 363 ug/dL    Comment: Performed at [facility], [place] [address], [place], [place] [address]  Ferritin     Status: Abnormal   Collection Time: 05/31/22  9:53 AM  Result Value Ref Range   Ferritin 10 (L) 11 - 307 ng/mL    Comment: Performed at [facility], [place] [address] [place], [place] [address]  Reticulocytes     Status: Abnormal   Collection Time: 05/31/22  9:53 AM  Result Value Ref Range   Retic Ct Pct 3.2 (H) 0.4 - 3.1 %   RBC. 2.90 (L) 3.87 - 5.11 MIL/uL   Retic Count, Absolute 93.4 19.0 - 186.0 K/uL   Immature Retic Fract 35.7 (H) 2.3 - 15.9 %    Comment: Performed at [facility], [place] [address], [place], [place] [address]  CBC     Status: Abnormal   Collection Time: 06/01/22  7:12 AM  Result Value Ref Range   WBC 6.4 4.0 - 10.5 K/uL   RBC 2.71 (L) 3.87 - 5.11 MIL/uL   Hemoglobin 7.7 (L) 12.0 - 15.0 g/dL   HCT 25.1 (L) 36.0 - 46.0 %   MCV 92.6 80.0 - 100.0 fL   MCH 28.4 26.0 - 34.0 pg   MCHC 30.7 30.0 - 36.0 g/dL   RDW 15.4 11.5 - 15.5 %   Platelets 294 150 - 400 K/uL   nRBC 0.0 0.0 - 0.2 %    Comment: Performed at [facility], [place] [address] [place], [place] [address]  Basic metabolic panel     Status: Abnormal   Collection Time: 06/01/22  7:12 AM  Result Value Ref Range   Sodium 140 135 - 145 mmol/L   Potassium 3.2 (L) 3.5 - 5.1 mmol/L   Chloride 113 (H) 98 - 111 mmol/L   CO2 21 (L) 22 - 32 mmol/L   Glucose, Bld 114 (H) 70 - 99 mg/dL    Comment: Glucose reference range applies only to samples taken after fasting for at least 8 hours.   BUN 6 (L) 8 - 23 mg/dL   Creatinine, Ser 0.68 0.44 - 1.00 mg/dL   Calcium 7.9 (L) 8.9 - 10.3 mg/dL   GFR, Estimated >60 >60 mL/min  Comment: (NOTE) Calculated using the CKD-EPI Creatinine Equation (2021)    Anion gap 6 5 - 15    Comment: Performed at [facility], [place] [address] [place],  [place] [address]  Prepare RBC (crossmatch)     Status: None   Collection Time: 06/01/22 11:02 AM  Result Value Ref Range   Order Confirmation      ORDER PROCESSED BY BLOOD BANK Performed at [facility], [place] [address] [place], [place] [address]     Studies/Results: DG Chest 1 View  Result Date: 05/30/2022 CLINICAL DATA:  Leukocytosis, GI bleeding, abdominal pain. EXAM: CHEST  1 VIEW COMPARISON:  05/17/2022 FINDINGS: Atherosclerotic calcification of the aortic arch. Small to moderate-sized hiatal hernia. Upper normal heart size. Subsegmental atelectasis or scarring peripherally at the left lung base. The lungs appear otherwise clear. IMPRESSION: 1. Small to moderate-sized hiatal hernia. 2. Subsegmental atelectasis or scarring peripherally at the left lung base. Electronically Signed   By: Van Clines M.D.   On: 05/30/2022 14:25   CT ANGIO GI BLEED  Result Date: 05/30/2022 CLINICAL DATA:  Mesenteric ischemia, acute rectal bleeding EXAM: CTA ABDOMEN AND PELVIS WITHOUT AND WITH CONTRAST TECHNIQUE: Multidetector CT imaging of the abdomen and pelvis was performed using the standard protocol during bolus administration of intravenous contrast. Multiplanar reconstructed images and MIPs were obtained and reviewed to evaluate the vascular anatomy. RADIATION DOSE REDUCTION: This exam was performed according to the departmental dose-optimization program which includes automated exposure control, adjustment of the mA and/or kV according to patient size and/or use of iterative reconstruction technique. CONTRAST:  149m OMNIPAQUE IOHEXOL 350 MG/ML SOLN COMPARISON:  CT 10/25/2021. FINDINGS: VASCULAR Aorta: Skin scattered atherosclerosis. No significant stenosis. No aneurysm or dissection. Celiac: Patent without significant stenosis. SMA: Patent without significant stenosis. Renals: Patent without significant stenosis. IMA: Patent without significant stenosis. Inflow: Scattered atherosclerosis. No  significant stenosis. No aneurysm or dissection. Proximal Outflow: Patent without significant stenosis. Veins: No obvious venous abnormality. Review of the MIP images confirms the above findings. NON-VASCULAR Lower chest: Left basilar scarring/linear atelectasis. No acute findings. Partially visualized left breast implant. Hepatobiliary: No focal liver abnormality is seen. The gallbladder is unremarkable. Pancreas: Unremarkable. No pancreatic ductal dilatation or surrounding inflammatory changes. Spleen: Normal in size without focal abnormality. Adrenals/Urinary Tract: Adrenal glands are unremarkable. No hydronephrosis or nephroureterolithiasis. Small exophytic left renal cyst which requires no follow-up imaging. Mildly distended urinary bladder. Stomach/Bowel: Moderate size hiatal hernia. There is no evidence of bowel obstruction.The appendix is normal. There is no evidence of active gastrointestinal bleed. Sigmoid diverticulosis. Lymphatic: No lymphadenopathy. Reproductive: Prior hysterectomy. Other: No abdominal wall hernia or abnormality. No abdominopelvic ascites. Musculoskeletal: No acute osseous abnormality. Levoconvex lumbar curvature. Multilevel degenerative changes of the spine. No suspicious osseous lesion. IMPRESSION: No CT evidence of active gastrointestinal bleed No other acute findings in the abdomen or pelvis. Sigmoid diverticulosis without evidence of acute diverticulitis. Moderate-sized hiatal hernia. Electronically Signed   By: JMaurine SimmeringM.D.   On: 05/30/2022 13:04    Medications: I have reviewed the patient's current medications.  Assessment: Painless hematochezia, suspected to be related to sigmoid diverticulosis Hemoglobin was 8.4 yesterday, decreased to 7.6, then increased to 8.4 and is now 7.7 today without transfusion. She has remained stable hemodynamically and has not had any further hematochezia which likely reflects resolved bleeding.  Plan: Will start regular diet and  continue Metamucil 3 times a day. If patient has no further bleeding, possibly discharge home today and evening. May  resume Plavix in a.m. tomorrow. Patient will be arranged for outpatient colonoscopy with me, likely at the end of February when she can be completely taken off Plavix. Discussed the same with the patient, her nurse at bedside and patient's hospitalist Dr. Tana Coast.  Ronnette Juniper, MD 06/01/2022, 12:42 PM

## 2022-06-01 NOTE — Progress Notes (Signed)
Pt refused cpap

## 2022-06-01 NOTE — Progress Notes (Addendum)
[facility]                                                                              [name] [name], is a 72 y.o. female, DOB - 04/12/1951, JXB:[id number] Admit date - 05/30/2022    Outpatient Primary MD for the patient is [name], MD  LOS - 1  days  Chief Complaint  Patient presents with   Rectal Bleeding       Brief summary   Patient is a 72 year old female with history of TIA, OSA, HLD, GERD presented with rectal bleeding.  Patient reported that she was in her normal state of health until the night before the admission on 1/9.  She had an episode of bloody bowel movement no chest pain, palpitations, dyspnea lightheadedness or dizziness at the time.  She was able to go to sleep but woke up on the morning of admission several more episodes of bloody BMs.  She spoke with [facility] and both recommended to come to the ED for evaluation.  No abdominal pain. In ED, initial hemoglobin 10.4 trended down to 7.6.  Previous hemoglobin on 05/17/2022 was 13.1. BMET remarkable.   Assessment & Plan    Principal Problem: GIB (gastrointestinal bleeding), acute blood loss anemia Iron deficiency anemia -Hemoglobin 10.4 on admission, trended down to 7.6 -This a.m. hemoglobin 8.4, continue to monitor closely, transfuse for hemoglobin <7.5 -Anemia panel showed ferritin 10, percent saturation ratio 9, received IV iron on 1/10 -GI consulted, Plavix currently held, last dose on Monday, 1/8 -Hemoglobin 7.7 today, no active bleeding -Seen by GI today, diet advanced to regular, started on Metamucil -If no further bleeding, can DC home today and resume Plavix tomorrow.  Will plan outpatient colonoscopy, likely at the end of Feb when she can be completely off Plavix. -Canceled packed RBC transfusion however per request of the patient, will repeat H&H at 4 PM.  If stable, can DC home today.   Active Problems:   TIA (transient ischemic attack) -Plavix currently on hold due to #1, per  GI okay to resume on 1/12    GERD (gastroesophageal reflux disease) -Continue PPI    Hyperlipidemia -Continue statin    OSA (obstructive sleep apnea) -CPAP nightly.      Hypokalemia -Potassium 3.2, replaced    Leukocytosis -Likely reactive, WBCs improved today  Anxiety, insomnia -Per patient, mostly at night, anxiety inr last few months - started on Remeron 7.5 mg daily and patient reports considerable improvement in her symptoms  Obesity Estimated body mass index is 31 kg/m as calculated from the following:   Height as of this encounter: 5' 4.5" (1.638 m).   Weight as of this encounter: 83.2 kg.  Code Status: Full code DVT Prophylaxis:  SCDs Start: 05/30/22 1548   Level of Care: Level of care: Telemetry Family Communication: Updated patient Disposition Plan:      Remains inpatient appropriate: Plan for possible DC home tonight if H&H remained stable Addendum: H/H stable 7.7, pt wants to stay overnight, repeat CBC in am. If stable, then go home tomorrow   Procedures:  None   Consultants:   GI  Antimicrobials: None  Medications  atorvastatin  40 mg Oral QHS   mirtazapine  7.5 mg Oral QHS   mometasone-formoterol  2 puff Inhalation BID   And   umeclidinium bromide  1 puff Inhalation Daily   pantoprazole (PROTONIX) IV  40 mg Intravenous Q12H   psyllium  1 packet Oral TID   zolpidem  5 mg Oral QHS      Subjective:   [name] was seen and examined today.  No ongoing active bleeding.  Reported some dark blood on wiping this a.m., probably residual bleeding.  No nausea vomiting or abdominal pain. Objective:   Vitals:   05/30/22 2047 05/31/22 0032 05/31/22 0558 05/31/22 0853  BP: 106/64 117/72 110/64   Pulse: 82 81 92   Resp: '17 18 18   '$ Temp: 97.9 F (36.6 C) 97.9 F (36.6 C) 97.8 F (36.6 C)   TempSrc: Oral Oral Oral   SpO2: 96% 96% 98% 97%  Weight:      Height:        Intake/Output Summary (Last 24 hours) at 05/31/2022 1455 Last data filed at  05/31/2022 0834 Gross per 24 hour  Intake 2400 ml  Output 1000 ml  Net 1400 ml     Wt Readings from Last 3 Encounters:  05/30/22 83.2 kg  05/23/22 82.3 kg  05/23/22 82.1 kg    Physical Exam General: Alert and oriented x 3, NAD Cardiovascular: S1 S2 clear, RRR.  Respiratory: CTAB Gastrointestinal: Soft, nontender, nondistended, NBS Ext: no pedal edema bilaterally Neuro: no new deficits Psych: Normal affect    Data Reviewed:  I have personally reviewed following labs    CBC Lab Results  Component Value Date   WBC 9.7 05/31/2022   RBC 2.90 (L) 05/31/2022   HGB 8.4 (L) 05/31/2022   HCT 27.3 (L) 05/31/2022   MCV 92.2 05/31/2022   MCH 28.4 05/31/2022   PLT 345 05/31/2022   MCHC 30.8 05/31/2022   RDW 15.3 05/31/2022   LYMPHSABS 3.0 05/30/2022   MONOABS 1.0 05/30/2022   EOSABS 0.5 05/30/2022   BASOSABS 0.1 72/01/4708     Last metabolic panel Lab Results  Component Value Date   NA 138 05/31/2022   K 3.8 05/31/2022   CL 111 05/31/2022   CO2 21 (L) 05/31/2022   BUN 16 05/31/2022   CREATININE 0.65 05/31/2022   GLUCOSE 93 05/31/2022   GFRNONAA >60 05/31/2022   CALCIUM 7.9 (L) 05/31/2022   PHOS 4.0 11/15/2021   PROT 5.6 (L) 05/31/2022   ALBUMIN 3.2 (L) 05/31/2022   LABGLOB 2.3 04/25/2022   AGRATIO 2.0 04/25/2022   BILITOT 0.8 05/31/2022   ALKPHOS 49 05/31/2022   AST 19 05/31/2022   ALT 27 05/31/2022   ANIONGAP 6 05/31/2022    CBG (last 3)  No results for input(s): "GLUCAP" in the last 72 hours.    Coagulation Profile: Recent Labs  Lab 05/30/22 1118  INR 1.1     Radiology Studies: I have personally reviewed the imaging studies  DG Chest 1 View  Result Date: 05/30/2022 CLINICAL DATA:  Leukocytosis, GI bleeding, abdominal pain. EXAM: CHEST  1 VIEW COMPARISON:  05/17/2022 FINDINGS: Atherosclerotic calcification of the aortic arch. Small to moderate-sized hiatal hernia. Upper normal heart size. Subsegmental atelectasis or scarring peripherally at the  left lung base. The lungs appear otherwise clear. IMPRESSION: 1. Small to moderate-sized hiatal hernia. 2. Subsegmental atelectasis or scarring peripherally at the left lung base. Electronically Signed   By: Van Clines M.D.   On: 05/30/2022 14:25  CT ANGIO GI BLEED  Result Date: 05/30/2022 CLINICAL DATA:  Mesenteric ischemia, acute rectal bleeding EXAM: CTA ABDOMEN AND PELVIS WITHOUT AND WITH CONTRAST TECHNIQUE: Multidetector CT imaging of the abdomen and pelvis was performed using the standard protocol during bolus administration of intravenous contrast. Multiplanar reconstructed images and MIPs were obtained and reviewed to evaluate the vascular anatomy. RADIATION DOSE REDUCTION: This exam was performed according to the departmental dose-optimization program which includes automated exposure control, adjustment of the mA and/or kV according to patient size and/or use of iterative reconstruction technique. CONTRAST:  1105m OMNIPAQUE IOHEXOL 350 MG/ML SOLN COMPARISON:  CT 10/25/2021. FINDINGS: VASCULAR Aorta: Skin scattered atherosclerosis. No significant stenosis. No aneurysm or dissection. Celiac: Patent without significant stenosis. SMA: Patent without significant stenosis. Renals: Patent without significant stenosis. IMA: Patent without significant stenosis. Inflow: Scattered atherosclerosis. No significant stenosis. No aneurysm or dissection. Proximal Outflow: Patent without significant stenosis. Veins: No obvious venous abnormality. Review of the MIP images confirms the above findings. NON-VASCULAR Lower chest: Left basilar scarring/linear atelectasis. No acute findings. Partially visualized left breast implant. Hepatobiliary: No focal liver abnormality is seen. The gallbladder is unremarkable. Pancreas: Unremarkable. No pancreatic ductal dilatation or surrounding inflammatory changes. Spleen: Normal in size without focal abnormality. Adrenals/Urinary Tract: Adrenal glands are unremarkable. No  hydronephrosis or nephroureterolithiasis. Small exophytic left renal cyst which requires no follow-up imaging. Mildly distended urinary bladder. Stomach/Bowel: Moderate size hiatal hernia. There is no evidence of bowel obstruction.The appendix is normal. There is no evidence of active gastrointestinal bleed. Sigmoid diverticulosis. Lymphatic: No lymphadenopathy. Reproductive: Prior hysterectomy. Other: No abdominal wall hernia or abnormality. No abdominopelvic ascites. Musculoskeletal: No acute osseous abnormality. Levoconvex lumbar curvature. Multilevel degenerative changes of the spine. No suspicious osseous lesion. IMPRESSION: No CT evidence of active gastrointestinal bleed No other acute findings in the abdomen or pelvis. Sigmoid diverticulosis without evidence of acute diverticulitis. Moderate-sized hiatal hernia. Electronically Signed   By: JMaurine SimmeringM.D.   On: 05/30/2022 13:04       Aaronjames Kelsay M.D. [facility] 05/31/2022, 2:55 PM  Available via Epic secure chat 7am-7pm After 7 pm, please refer to night coverage provider listed on amion.

## 2022-06-02 DIAGNOSIS — K922 Gastrointestinal hemorrhage, unspecified: Secondary | ICD-10-CM | POA: Diagnosis not present

## 2022-06-02 DIAGNOSIS — Q2112 Patent foramen ovale: Secondary | ICD-10-CM | POA: Diagnosis not present

## 2022-06-02 DIAGNOSIS — Z8774 Personal history of (corrected) congenital malformations of heart and circulatory system: Secondary | ICD-10-CM | POA: Diagnosis not present

## 2022-06-02 LAB — CBC
HCT: 24.4 % — ABNORMAL LOW (ref 36.0–46.0)
Hemoglobin: 7.7 g/dL — ABNORMAL LOW (ref 12.0–15.0)
MCH: 29.1 pg (ref 26.0–34.0)
MCHC: 31.6 g/dL (ref 30.0–36.0)
MCV: 92.1 fL (ref 80.0–100.0)
Platelets: 290 10*3/uL (ref 150–400)
RBC: 2.65 MIL/uL — ABNORMAL LOW (ref 3.87–5.11)
RDW: 15.6 % — ABNORMAL HIGH (ref 11.5–15.5)
WBC: 7.5 10*3/uL (ref 4.0–10.5)
nRBC: 0.3 % — ABNORMAL HIGH (ref 0.0–0.2)

## 2022-06-02 MED ORDER — MELATONIN 3 MG PO TABS: 3.0000 mg | ORAL_TABLET | Freq: Every day | ORAL | 3 refills | Status: AC

## 2022-06-02 NOTE — Plan of Care (Signed)
Patient AOX4, VSS throughout shift. Denied pain and SOB. All meds given on time as ordered. Pt voided in bathroom.  No Bms this shift. Diminished lungs, IS taught and encouraged. POC maintained, will continue to monitor.  Problem: Education: Goal: Knowledge of General Education information will improve Description: Including pain rating scale, medication(s)/side effects and non-pharmacologic comfort measures Outcome: Progressing   Problem: Health Behavior/Discharge Planning: Goal: Ability to manage health-related needs will improve Outcome: Progressing   Problem: Clinical Measurements: Goal: Ability to maintain clinical measurements within normal limits will improve Outcome: Progressing Goal: Will remain free from infection Outcome: Progressing Goal: Diagnostic test results will improve Outcome: Progressing Goal: Respiratory complications will improve Outcome: Progressing Goal: Cardiovascular complication will be avoided Outcome: Progressing   Problem: Activity: Goal: Risk for activity intolerance will decrease Outcome: Progressing   Problem: Nutrition: Goal: Adequate nutrition will be maintained Outcome: Progressing   Problem: Coping: Goal: Level of anxiety will decrease Outcome: Progressing   Problem: Elimination: Goal: Will not experience complications related to bowel motility Outcome: Progressing Goal: Will not experience complications related to urinary retention Outcome: Progressing   Problem: Pain Managment: Goal: General experience of comfort will improve Outcome: Progressing   Problem: Safety: Goal: Ability to remain free from injury will improve Outcome: Progressing   Problem: Skin Integrity: Goal: Risk for impaired skin integrity will decrease Outcome: Progressing

## 2022-06-02 NOTE — TOC Initial Note (Signed)
Transition of Care [facility]) - Initial/Assessment Note    Patient Details  Name: [name] [name] MRN: [id number] Date of Birth: 05/04/1951  Transition of Care [facility]) CM/SW Contact:    Angelita Ingles, RN Phone [phone]  06/02/2022, 8:46 AM  Clinical Narrative:                  Transition of Care [facility]) Screening Note   Patient Details  Name: [name] [name] Date of Birth: Nov 07, 1950   Transition of Care [facility]) CM/SW Contact:    Angelita Ingles, RN Phone Number: 06/02/2022, 8:46 AM    Transition of Care Department [facility]) has reviewed patient and no TOC needs have been identified at this time. We will continue to monitor patient advancement through interdisciplinary progression rounds. If new patient transition needs arise, please place a TOC consult.          Patient Goals and CMS Choice            Expected Discharge Plan and Services                                              Prior Living Arrangements/Services                       Activities of Daily Living Home Assistive Devices/Equipment: None ADL Screening (condition at time of admission) Is the patient deaf or have difficulty hearing?: No Does the patient have difficulty seeing, even when wearing glasses/contacts?: No Does the patient have difficulty concentrating, remembering, or making decisions?: No Does the patient have difficulty dressing or bathing?: No Does the patient have difficulty walking or climbing stairs?: No  Permission Sought/Granted                  Emotional Assessment              Admission diagnosis:  GIB (gastrointestinal bleeding) [K92.2] Acute GI bleeding [K92.2] Lower GI bleed [K92.2] Patient Active Problem List   Diagnosis Date Noted   PFO (patent foramen ovale) 06/01/2022   Acute GI bleeding 05/30/2022   OSA (obstructive sleep apnea) 05/30/2022   Hypokalemia 05/30/2022   Leukocytosis 05/30/2022   Paradoxical embolus ([place]) 04/07/2022    History of cardioembolic cerebrovascular accident (CVA) 04/07/2022   Status post device closure of ASD 04/07/2022   Folate deficiency 11/16/2021   History of GI diverticular bleed 11/16/2021   TIA (transient ischemic attack) 11/15/2021   Iron deficiency anemia 11/15/2021   Acute lower GI bleeding 10/25/2021   GERD (gastroesophageal reflux disease) 10/25/2021   Diverticulosis of intestine with bleeding 10/25/2021   Acute blood loss anemia 10/25/2021   Hyperlipidemia 10/25/2021   Aortic atherosclerosis ([place]) 10/25/2021   PCP:  [name], MD Pharmacy:   [id number]: [phone]: [phone]    Social Determinants of Health (SDOH) Social History: SDOH Screenings   Food Insecurity: No Food Insecurity (05/30/2022)  Transportation Needs: No Transportation Needs (05/30/2022)  Utilities: Not At Risk (05/30/2022)  Depression (PHQ2-9): Low Risk  (12/22/2021)  Tobacco Use: Low Risk  (05/30/2022)   SDOH Interventions:     Readmission Risk Interventions     No data to display

## 2022-06-02 NOTE — Progress Notes (Signed)
Mobility Specialist - Progress Note   06/02/22 0845  Mobility  Activity Ambulated with assistance in hallway  Level of Assistance Modified independent, requires aide device or extra time  Assistive Device Other (Comment) (iv pole)  Distance Ambulated (ft) 500 ft  Activity Response Tolerated well  Mobility Referral Yes  $Mobility charge 1 Mobility   Pt received in bed and agreed to mobility, had no c/o pain nor discomfort during session. Pt returned to bed with all needs met.   Roderick Pee Mobility Specialist

## 2022-06-02 NOTE — Progress Notes (Signed)
Subjective: Patient states last night she had sediment that looked like dark blood and had some dark blood on wiping today in the morning. She feels more awake yesterday. She has been tolerating regular food. Denies abdominal pain.  Objective: Vital signs in last 24 hours: Temp:  [98.2 F (36.8 C)-98.6 F (37 C)] 98.6 F (37 C) (01/12 0651) Pulse Rate:  [82-103] 82 (01/12 0651) Resp:  [17-18] 17 (01/12 0651) BP: (116-145)/(59-108) 118/60 (01/12 0651) SpO2:  [94 %-98 %] 97 % (01/12 0846) Weight change:  Last BM Date : 05/30/22  PE: Not in distress, pleasant and cooperative GENERAL: Mild pallor  ABDOMEN: Soft, nondistended, nontender EXTREMITIES: No deformity T  Lab Results: Results for orders placed or performed during the hospital encounter of 05/30/22 (from the past 48 hour(s))  CBC     Status: Abnormal   Collection Time: 06/01/22  7:12 AM  Result Value Ref Range   WBC 6.4 4.0 - 10.5 K/uL   RBC 2.71 (L) 3.87 - 5.11 MIL/uL   Hemoglobin 7.7 (L) 12.0 - 15.0 g/dL   HCT 25.1 (L) 36.0 - 46.0 %   MCV 92.6 80.0 - 100.0 fL   MCH 28.4 26.0 - 34.0 pg   MCHC 30.7 30.0 - 36.0 g/dL   RDW 15.4 11.5 - 15.5 %   Platelets 294 150 - 400 K/uL   nRBC 0.0 0.0 - 0.2 %    Comment: Performed at [facility], [place] [address] [place], [place] [address]  Basic metabolic panel     Status: Abnormal   Collection Time: 06/01/22  7:12 AM  Result Value Ref Range   Sodium 140 135 - 145 mmol/L   Potassium 3.2 (L) 3.5 - 5.1 mmol/L   Chloride 113 (H) 98 - 111 mmol/L   CO2 21 (L) 22 - 32 mmol/L   Glucose, Bld 114 (H) 70 - 99 mg/dL    Comment: Glucose reference range applies only to samples taken after fasting for at least 8 hours.   BUN 6 (L) 8 - 23 mg/dL   Creatinine, Ser 0.68 0.44 - 1.00 mg/dL   Calcium 7.9 (L) 8.9 - 10.3 mg/dL   GFR, Estimated >60 >60 mL/min    Comment: (NOTE) Calculated using the CKD-EPI Creatinine Equation (2021)    Anion gap 6 5 - 15    Comment: Performed  at [facility], [place] [address], [place], [place] [address]  Prepare RBC (crossmatch)     Status: None   Collection Time: 06/01/22 11:02 AM  Result Value Ref Range   Order Confirmation      ORDER PROCESSED BY BLOOD BANK Performed at [name], [place] [address], [place], [place] [address]   Hemoglobin and hematocrit, blood     Status: Abnormal   Collection Time: 06/01/22  3:36 PM  Result Value Ref Range   Hemoglobin 7.7 (L) 12.0 - 15.0 g/dL   HCT 25.1 (L) 36.0 - 46.0 %    Comment: Performed at [facility], [place] [address], [place], [place] [address]  CBC     Status: Abnormal   Collection Time: 06/02/22  5:43 AM  Result Value Ref Range   WBC 7.5 4.0 - 10.5 K/uL   RBC 2.65 (L) 3.87 - 5.11 MIL/uL   Hemoglobin 7.7 (L) 12.0 - 15.0 g/dL   HCT 24.4 (L) 36.0 - 46.0 %   MCV 92.1 80.0 - 100.0 fL   MCH 29.1 26.0 - 34.0 pg  MCHC 31.6 30.0 - 36.0 g/dL   RDW 15.6 (H) 11.5 - 15.5 %   Platelets 290 150 - 400 K/uL   nRBC 0.3 (H) 0.0 - 0.2 %    Comment: Performed at [facility], [place] [address] [place], [place] [address]    Studies/Results: No results found.  Medications: I have reviewed the patient's current medications.  Assessment: Hematochezia with colonic diverticulosis Hb has been stable for the last 3 readings at 7.7  Iron deficiency anemia  Mild hypokalemia  Plan: Okay to DC home today. Okay to resume Plavix. Advised patient to continue Metamucil/Benefiber/psyllium husk 1-3 times every day. Recommend discharging patient on ferrous sulfate 325 mg p.o. daily. Hopefully patient can be taken off Plavix at the end of February, plan elective colonoscopy with me at that point.  Ronnette Juniper, MD 06/02/2022, 11:13 AM

## 2022-06-02 NOTE — Progress Notes (Signed)
PT refused CPAP.  

## 2022-06-02 NOTE — Discharge Summary (Signed)
Physician Discharge Summary   Patient: [name] [name] MRN: [id number] DOB: [birth year]  Admit date:     05/30/2022  Discharge date: 06/02/22  Discharge Physician: Estill Cotta, MD    PCP: [name], MD   Recommendations at discharge:   Okay to resume Plavix Per GI, plan for outpatient colonoscopy at the end of February once patient can be taken off of Plavix Started on Remeron 7.5 mg p.o. daily at bedtime  Discharge Diagnoses:    Acute GI bleeding   TIA (transient ischemic attack)   GERD (gastroesophageal reflux disease)   Hyperlipidemia   Status post device closure of ASD   OSA (obstructive sleep apnea)   Hypokalemia Iron deficiency anemia   Leukocytosis   PFO (patent foramen ovale)    Hospital Course:  Patient is a 72 year old female with history of TIA, OSA, HLD, GERD presented with rectal bleeding.  Patient reported that she was in her normal state of health until the night before the admission on 1/9.  She had an episode of bloody bowel movement no chest pain, palpitations, dyspnea lightheadedness or dizziness at the time.  She was able to go to sleep but woke up on the morning of admission several more episodes of bloody BMs.  She spoke with [facility] and both recommended to come to the ED for evaluation.  No abdominal pain. In ED, initial hemoglobin 10.4 trended down to 7.6.  Previous hemoglobin on 05/17/2022 was 13.1. BMET remarkable.   Assessment and Plan:  GIB (gastrointestinal bleeding), acute blood loss anemia Iron deficiency anemia -Hemoglobin 10.4 on admission, trended down to 7.6 -Anemia panel showed ferritin 10, percent saturation ratio 9, received IV iron on 1/10 -GI was consulted, Plavix was held.  Seen by Dr. Therisa Doyne, recommended outpatient colonoscopy as H&H has remained stable, no further bleeding. -Patient will resume Plavix and hopefully outpatient colonoscopy at end of February when patient can be taken off of Plavix.    -Started on MiraLAX, Protonix,  Metamucil    TIA (transient ischemic attack) -Resume Plavix     GERD (gastroesophageal reflux disease) -Continue PPI     Hyperlipidemia -Continue statin     OSA (obstructive sleep apnea) -CPAP nightly.       Hypokalemia -Potassium 3.2, replaced     Leukocytosis -Likely reactive, WBCs improved    Anxiety, insomnia -Per patient, mostly at night, anxiety inr last few months - started on Remeron 7.5 mg daily and patient reports considerable improvement in her symptoms   Obesity Estimated body mass index is 31 kg/m as calculated from the following:   Height as of this encounter: 5' 4.5" (1.638 m).   Weight as of this encounter: 83.2 kg.       Pain control - [employer] Controlled Substance Reporting System database was reviewed. and patient was instructed, not to drive, operate heavy machinery, perform activities at heights, swimming or participation in water activities or provide baby-sitting services while on Pain, Sleep and Anxiety Medications; until their outpatient Physician has advised to do so again. Also recommended to not to take more than prescribed Pain, Sleep and Anxiety Medications.  Consultants: Gastroenterology Procedures performed: None Disposition: Home Diet recommendation:  Discharge Diet Orders (From admission, onward)     Start     Ordered   06/02/22 0000  Diet - low sodium heart healthy        06/02/22 1002            DISCHARGE MEDICATION: Allergies as of 06/02/2022  Reactions   Hydroxyzine Anxiety        Medication List     STOP taking these medications    aspirin EC 81 MG tablet   famotidine 20 MG tablet Commonly known as: PEPCID   hydrOXYzine 25 MG tablet Commonly known as: ATARAX   predniSONE 10 MG tablet Commonly known as: DELTASONE   zolpidem 5 MG tablet Commonly known as: Ambien       TAKE these medications    atorvastatin 40 MG tablet Commonly known as: LIPITOR Take 1 tablet (40 mg total) by mouth at  bedtime.   Breztri Aerosphere 160-9-4.8 MCG/ACT Aero Generic drug: Budeson-Glycopyrrol-Formoterol Inhale 2 puffs into the lungs in the morning and at bedtime. What changed: how much to take   clopidogrel 75 MG tablet Commonly known as: Plavix Take 1 tablet (75 mg total) by mouth daily.   ferrous sulfate 325 (65 FE) MG tablet Take 325 mg by mouth every Monday, Wednesday, and Friday.   fluticasone 50 MCG/ACT nasal spray Commonly known as: FLONASE Place 1 spray into both nostrils daily as needed for allergies or rhinitis.   folic acid 1 MG tablet Commonly known as: FOLVITE Take 1 mg by mouth daily.   melatonin 3 MG Tabs tablet Take 1 tablet (3 mg total) by mouth at bedtime. Also available over-the-counter   mirtazapine 7.5 MG tablet Commonly known as: REMERON Take 1 tablet (7.5 mg total) by mouth at bedtime.   OVER THE COUNTER MEDICATION Take 1 capsule by mouth daily. [place] with Astaxanthin capsules- Take 1 capsule by mouth every morning   OVER THE COUNTER MEDICATION Take 1 capsule by mouth daily. Kelli Hope Essential-1 with Vitamin D3-2000 capsules- Take 1 capsule by mouth once a day after breakfast   pantoprazole 40 MG tablet Commonly known as: PROTONIX Take 1 tablet (40 mg total) by mouth daily.   polyethylene glycol 17 g packet Commonly known as: MIRALAX / GLYCOLAX Take 17 g by mouth daily. What changed:  when to take this reasons to take this   psyllium 95 % Pack Commonly known as: HYDROCIL/METAMUCIL Take 1 packet by mouth 3 (three) times daily. Also available OTC   Vitamin D (Ergocalciferol) 1.25 MG (50000 UNIT) Caps capsule Commonly known as: DRISDOL Take 50,000 Units by mouth See admin instructions. Take 50,000 units by mouth every Wednesday and Saturday        Follow-up Information     [name], MD. Schedule an appointment as soon as possible for a visit in 2 week(s).   Specialty: Family Medicine Why: for  hospital follow-up Contact information: [place] [address] [phone]         Ronnette Juniper, MD. Schedule an appointment as soon as possible for a visit in 4 week(s).   Specialty: Gastroenterology Why: for hospital follow-up Contact information: [place] [address] [phone]                Discharge Exam: Danley Danker Weights   05/30/22 1605  Weight: 83.2 kg   S: No further bleeding, has been able to sleep well with Remeron, cleared by GI to discharge home.  BP 118/60 (BP Location: Right Arm)   Pulse 82   Temp 98.6 F (37 C) (Oral)   Resp 17   Ht 5' 4.5" (1.638 m)   Wt 83.2 kg   SpO2 97%   BMI 31.00 kg/m    Physical Exam General: Alert and oriented  x 3, NAD Cardiovascular: S1 S2 clear, RRR.  Respiratory: CTAB, no wheezing, rales or rhonchi Gastrointestinal: Soft, nontender, nondistended, NBS Ext: no pedal edema bilaterally Psych: Normal affect    Condition at discharge: fair  The results of significant diagnostics from this hospitalization (including imaging, microbiology, ancillary and laboratory) are listed below for reference.   Imaging Studies: DG Chest 1 View  Result Date: 05/30/2022 CLINICAL DATA:  Leukocytosis, GI bleeding, abdominal pain. EXAM: CHEST  1 VIEW COMPARISON:  05/17/2022 FINDINGS: Atherosclerotic calcification of the aortic arch. Small to moderate-sized hiatal hernia. Upper normal heart size. Subsegmental atelectasis or scarring peripherally at the left lung base. The lungs appear otherwise clear. IMPRESSION: 1. Small to moderate-sized hiatal hernia. 2. Subsegmental atelectasis or scarring peripherally at the left lung base. Electronically Signed   By: Van Clines M.D.   On: 05/30/2022 14:25   CT ANGIO GI BLEED  Result Date: 05/30/2022 CLINICAL DATA:  Mesenteric ischemia, acute rectal bleeding EXAM: CTA ABDOMEN AND PELVIS WITHOUT AND WITH CONTRAST TECHNIQUE: Multidetector CT imaging  of the abdomen and pelvis was performed using the standard protocol during bolus administration of intravenous contrast. Multiplanar reconstructed images and MIPs were obtained and reviewed to evaluate the vascular anatomy. RADIATION DOSE REDUCTION: This exam was performed according to the departmental dose-optimization program which includes automated exposure control, adjustment of the mA and/or kV according to patient size and/or use of iterative reconstruction technique. CONTRAST:  165m OMNIPAQUE IOHEXOL 350 MG/ML SOLN COMPARISON:  CT 10/25/2021. FINDINGS: VASCULAR Aorta: Skin scattered atherosclerosis. No significant stenosis. No aneurysm or dissection. Celiac: Patent without significant stenosis. SMA: Patent without significant stenosis. Renals: Patent without significant stenosis. IMA: Patent without significant stenosis. Inflow: Scattered atherosclerosis. No significant stenosis. No aneurysm or dissection. Proximal Outflow: Patent without significant stenosis. Veins: No obvious venous abnormality. Review of the MIP images confirms the above findings. NON-VASCULAR Lower chest: Left basilar scarring/linear atelectasis. No acute findings. Partially visualized left breast implant. Hepatobiliary: No focal liver abnormality is seen. The gallbladder is unremarkable. Pancreas: Unremarkable. No pancreatic ductal dilatation or surrounding inflammatory changes. Spleen: Normal in size without focal abnormality. Adrenals/Urinary Tract: Adrenal glands are unremarkable. No hydronephrosis or nephroureterolithiasis. Small exophytic left renal cyst which requires no follow-up imaging. Mildly distended urinary bladder. Stomach/Bowel: Moderate size hiatal hernia. There is no evidence of bowel obstruction.The appendix is normal. There is no evidence of active gastrointestinal bleed. Sigmoid diverticulosis. Lymphatic: No lymphadenopathy. Reproductive: Prior hysterectomy. Other: No abdominal wall hernia or abnormality. No  abdominopelvic ascites. Musculoskeletal: No acute osseous abnormality. Levoconvex lumbar curvature. Multilevel degenerative changes of the spine. No suspicious osseous lesion. IMPRESSION: No CT evidence of active gastrointestinal bleed No other acute findings in the abdomen or pelvis. Sigmoid diverticulosis without evidence of acute diverticulitis. Moderate-sized hiatal hernia. Electronically Signed   By: JMaurine SimmeringM.D.   On: 05/30/2022 13:04   DG Chest 2 View  Result Date: 05/17/2022 CLINICAL DATA:  Short of breath, palpitations EXAM: CHEST - 2 VIEW COMPARISON:  05/11/2022 FINDINGS: Frontal and lateral views of the chest demonstrate an unremarkable cardiac silhouette. Stable hiatal hernia. No airspace disease, effusion, or pneumothorax. No acute bony abnormality. IMPRESSION: 1. No acute intrathoracic process. 2. Stable hiatal hernia. Electronically Signed   By: MRanda NgoM.D.   On: 05/17/2022 23:08   ECHOCARDIOGRAM COMPLETE  Result Date: 05/17/2022    ECHOCARDIOGRAM REPORT   Patient Name:   KKEYLAH DARWISHDate of Exam: 05/17/2022 Medical Rec #:  [id number]     Height:  64.5 in Accession #:    [id number]     Weight:       180.0 lb Date of Birth:  1950/07/31      BSA:          1.881 m Patient Age:    36 years       BP:           150/83 mmHg Patient Gender: F              HR:           75 bpm. Exam Location:  Outpatient Procedure: 2D Echo, 3D Echo, Cardiac Doppler, Color Doppler and Strain Analysis Indications:    Orthopnea; R06.02 SOB  History:        Patient has prior history of Echocardiogram examinations, most                 recent 04/07/2022. TIA and Stroke; Risk Factors:Dyslipidemia.                 PFO closure. History of breast cancer.  Sonographer:    Roseanna Rainbow RDCS Referring Phys: [id number] [place]  Sonographer Comments: Technically difficult study due to poor echo windows. Image acquisition challenging due to mastectomy, Image acquisition challenging due to breast implants and  Image acquisition challenging due to respiratory motion. IMPRESSIONS  1. Left ventricular ejection fraction, by estimation, is 55 to 60%. Left ventricular ejection fraction by 3D volume is 58 %. The left ventricle has normal function. The left ventricle has no regional wall motion abnormalities. Left ventricular diastolic  parameters are indeterminate.  2. Right ventricular systolic function is normal. The right ventricular size is normal.  3. Left atrial size was mildly dilated.  4. 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 There is possible left to right color flow jet noted (images 76 and 99), which may  represent a residual atrial septal defect.  5. The mitral valve is grossly normal. Trivial mitral valve regurgitation.  6. The aortic valve is tricuspid. Aortic valve regurgitation is not visualized.  7. The inferior vena cava is normal in size with greater than 50% respiratory variability, suggesting right atrial pressure of 3 mmHg. Comparison(s): Prior images reviewed side by side. Changes from prior study are noted. 04/07/2022: LVEF 60-65%, 16 mm Amplatzer device noted without shunting. Conclusion(s)/Recommendation(s): Findings concerning for possible left to right shunt. CT images reviewed as well. Would recommend Transesophageal Echocardiogram with bubble study for clarification. FINDINGS  Left Ventricle: Left ventricular ejection fraction, by estimation, is 55 to 60%. Left ventricular ejection fraction by 3D volume is 58 %. The left ventricle has normal function. The left ventricle has no regional wall motion abnormalities. The left ventricular internal cavity size was normal in size. There is no left ventricular hypertrophy. Left ventricular diastolic parameters are indeterminate. Right Ventricle: The right ventricular size is normal. No increase in right ventricular wall thickness.  Right ventricular systolic function is normal. Left Atrium: Left atrial size was mildly dilated. Right Atrium: Right atrial size was normal in size. Pericardium: There is no evidence of pericardial effusion. Mitral Valve: The mitral valve is grossly normal. Trivial mitral valve regurgitation. Tricuspid Valve: The tricuspid valve is grossly normal. Tricuspid valve regurgitation is trivial. Aortic Valve: The aortic valve is tricuspid. Aortic valve regurgitation is not visualized. Pulmonic Valve: The pulmonic valve was normal in structure. Pulmonic valve regurgitation  is not visualized. Aorta: The aortic root and ascending aorta are structurally normal, with no evidence of dilitation. Venous: The inferior vena cava is normal in size with greater than 50% respiratory variability, suggesting right atrial pressure of 3 mmHg. IAS/Shunts: .  LEFT VENTRICLE PLAX 2D LVIDd:         4.80 cm         Diastology LVIDs:         3.20 cm         LV e' medial:    5.00 cm/s LV PW:         1.10 cm         LV E/e' medial:  15.8 LV IVS:        0.90 cm         LV e' lateral:   9.57 cm/s LVOT diam:     2.50 cm         LV E/e' lateral: 8.2 LV SV:         103 LV SV Index:   55 LVOT Area:     4.91 cm        3D Volume EF                                LV 3D EF:    Left                                             ventricul LV Volumes (MOD)                            ar LV vol d, MOD    40.1 ml                    ejection A2C:                                        fraction LV vol d, MOD    59.0 ml                    by 3D A4C:                                        volume is LV vol s, MOD    10.9 ml                    58 %. A2C: LV vol s, MOD    22.6 ml A4C:                           3D Volume EF: LV SV MOD A2C:   29.2 ml       3D EF:        58 % LV SV MOD A4C:   59.0 ml       LV EDV:       153 ml LV SV MOD BP:    35.1 ml       LV ESV:       64 ml  LV SV:        89 ml RIGHT VENTRICLE             IVC RV S prime:      11.50 cm/s  IVC diam: 1.30 cm TAPSE (M-mode): 2.5 cm LEFT ATRIUM             Index        RIGHT ATRIUM           Index LA diam:        3.10 cm 1.65 cm/m   RA Area:     13.40 cm LA Vol (A2C):   22.7 ml 12.07 ml/m  RA Volume:   37.30 ml  19.83 ml/m LA Vol (A4C):   16.5 ml 8.77 ml/m LA Biplane Vol: 19.8 ml 10.53 ml/m  AORTIC VALVE LVOT Vmax:   120.00 cm/s LVOT Vmean:  76.400 cm/s LVOT VTI:    0.209 m  AORTA Ao Root diam: 3.50 cm Ao Asc diam:  3.45 cm MITRAL VALVE MV Area (PHT): 4.27 cm    SHUNTS MV Decel Time: 178 msec    Systemic VTI:  0.21 m MV E velocity: 78.85 cm/s  Systemic Diam: 2.50 cm MV A velocity: 94.15 cm/s MV E/A ratio:  0.84 Lyman Bishop MD Electronically signed by Lyman Bishop MD Signature Date/Time: 05/17/2022/4:13:12 PM    Final    DG Chest Portable 1 View  Result Date: 05/11/2022 CLINICAL DATA:  Shortness of breath EXAM: PORTABLE CHEST 1 VIEW COMPARISON:  Chest radiograph 05/06/2022 FINDINGS: The cardiomediastinal silhouette is stable, with unchanged tortuosity of the thoracic aorta. There is no focal consolidation or pulmonary edema. There is no pleural effusion or pneumothorax. A hiatal hernia is again noted. Right breast and left axillary surgical clips are again noted. IMPRESSION: Stable chest with no radiographic evidence of acute cardiopulmonary process. Electronically Signed   By: Valetta Mole M.D.   On: 05/11/2022 15:48   CT Angio Chest Pulmonary Embolism (PE) W or WO Contrast  Result Date: 05/06/2022 CLINICAL DATA:  Shortness of breath. Elevated D-dimer. Clinical suspicion for pulmonary embolism. EXAM: CT ANGIOGRAPHY CHEST WITH CONTRAST TECHNIQUE: Multidetector CT imaging of the chest was performed using the standard protocol during bolus administration of intravenous contrast. Multiplanar CT image reconstructions and MIPs were obtained to evaluate the vascular anatomy. RADIATION DOSE REDUCTION: This exam was performed according to the departmental dose-optimization program  which includes automated exposure control, adjustment of the mA and/or kV according to patient size and/or use of iterative reconstruction technique. CONTRAST:  29m OMNIPAQUE IOHEXOL 350 MG/ML SOLN COMPARISON:  None Available. FINDINGS: Cardiovascular: Satisfactory opacification of pulmonary arteries noted, and no pulmonary emboli identified. No evidence of thoracic aortic dissection or aneurysm. Aortic and coronary atherosclerotic calcification incidentally noted. Mediastinum/Nodes: No masses or pathologically enlarged lymph nodes identified. Lungs/Pleura: Poorly defined areas of airspace and ground-glass opacity are seen in both upper lobes in the superior segment of the right lower lobe. No evidence of mass or pleural effusion. Upper abdomen: Large hiatal hernia is seen. Musculoskeletal: No suspicious bone lesions identified. Review of the MIP images confirms the above findings. IMPRESSION: No evidence of pulmonary embolism. Poorly defined areas of airspace and ground-glass opacity in both upper lobes and superior segment of right lower lobe. Differential diagnosis includes pneumonia, aspiration, and inflammatory etiologies. Recommend continued follow-up by CT in 1-2 months. Large hiatal hernia. Aortic Atherosclerosis (ICD10-I70.0). Electronically Signed   By: JMarlaine HindM.D.   On: 05/06/2022 14:16   DG Chest Portable  1 View  Result Date: 05/06/2022 CLINICAL DATA:  Shortness of breath. EXAM: PORTABLE CHEST 1 VIEW COMPARISON:  One-view chest x-ray 04/30/2022 FINDINGS: Heart size is normal. Hilar hernia again noted. Lungs are clear. No edema or effusion is present. No focal airspace disease present. Changes of COPD again noted. IMPRESSION: 1. No acute cardiopulmonary disease. 2. COPD. Electronically Signed   By: San Morelle M.D.   On: 05/06/2022 12:30    Microbiology: Results for orders placed or performed during the hospital encounter of 05/11/22  Resp panel by RT-PCR (RSV, Flu A&B, Covid)  Anterior Nasal Swab     Status: None   Collection Time: 05/11/22  3:03 PM   Specimen: Anterior Nasal Swab  Result Value Ref Range Status   SARS Coronavirus 2 by RT PCR NEGATIVE NEGATIVE Final    Comment: (NOTE) SARS-CoV-2 target nucleic acids are NOT DETECTED.  The SARS-CoV-2 RNA is generally detectable in upper respiratory specimens during the acute phase of infection. The lowest concentration of SARS-CoV-2 viral copies this assay can detect is 138 copies/mL. A negative result does not preclude SARS-Cov-2 infection and should not be used as the sole basis for treatment or other patient management decisions. A negative result may occur with  improper specimen collection/handling, submission of specimen other than nasopharyngeal swab, presence of viral mutation(s) within the areas targeted by this assay, and inadequate number of viral copies(<138 copies/mL). A negative result must be combined with clinical observations, patient history, and epidemiological information. The expected result is Negative.  Fact Sheet for Patients:  EntrepreneurPulse.com.au  Fact Sheet for Healthcare Providers:  IncredibleEmployment.be  This test is no t yet approved or cleared by the [place] FDA and  has been authorized for detection and/or diagnosis of SARS-CoV-2 by FDA under an Emergency Use Authorization (EUA). This EUA will remain  in effect (meaning this test can be used) for the duration of the COVID-19 declaration under Section 564(b)(1) of the Act, 21 U.S.C.section 360bbb-3(b)(1), unless the authorization is terminated  or revoked sooner.       Influenza A by PCR NEGATIVE NEGATIVE Final   Influenza B by PCR NEGATIVE NEGATIVE Final    Comment: (NOTE) The Xpert Xpress SARS-CoV-2/FLU/RSV plus assay is intended as an aid in the diagnosis of influenza from Nasopharyngeal swab specimens and should not be used as a sole basis for treatment. Nasal washings  and aspirates are unacceptable for Xpert Xpress SARS-CoV-2/FLU/RSV testing.  Fact Sheet for Patients: EntrepreneurPulse.com.au  Fact Sheet for Healthcare Providers: IncredibleEmployment.be  This test is not yet approved or cleared by the [place] FDA and has been authorized for detection and/or diagnosis of SARS-CoV-2 by FDA under an Emergency Use Authorization (EUA). This EUA will remain in effect (meaning this test can be used) for the duration of the COVID-19 declaration under Section 564(b)(1) of the Act, 21 U.S.C. section 360bbb-3(b)(1), unless the authorization is terminated or revoked.     Resp Syncytial Virus by PCR NEGATIVE NEGATIVE Final    Comment: (NOTE) Fact Sheet for Patients: EntrepreneurPulse.com.au  Fact Sheet for Healthcare Providers: IncredibleEmployment.be  This test is not yet approved or cleared by the [place] FDA and has been authorized for detection and/or diagnosis of SARS-CoV-2 by FDA under an Emergency Use Authorization (EUA). This EUA will remain in effect (meaning this test can be used) for the duration of the COVID-19 declaration under Section 564(b)(1) of the Act, 21 U.S.C. section 360bbb-3(b)(1), unless the authorization is terminated or revoked.  Performed at [facility], [place] [address] [place], [place] [address]     Labs: CBC: Recent Labs  Lab 05/30/22 1118 05/30/22 1754 05/30/22 2358 05/31/22 0617 06/01/22 0712 06/01/22 1536 06/02/22 0543  WBC 16.8*  --   --  9.7 6.4  --  7.5  NEUTROABS 12.1*  --   --   --   --   --   --   HGB 10.4*   < > 7.6* 8.4* 7.7* 7.7* 7.7*  HCT 32.9*   < > 24.1* 27.3* 25.1* 25.1* 24.4*  MCV 90.4  --   --  92.2 92.6  --  92.1  PLT 400  --   --  345 294  --  290   < > = values in this interval not displayed.   Basic Metabolic Panel: Recent Labs  Lab 05/30/22 1118 05/30/22 1449 05/31/22 0617 06/01/22 0712  NA  137  --  138 140  K 3.3*  --  3.8 3.2*  CL 107  --  111 113*  CO2 21*  --  21* 21*  GLUCOSE 121*  --  93 114*  BUN 32*  --  16 6*  CREATININE 0.80  --  0.65 0.68  CALCIUM 8.7*  --  7.9* 7.9*  MG  --  2.3  --   --    Liver Function Tests: Recent Labs  Lab 05/30/22 1118 05/31/22 0617  AST 24 19  ALT 31 27  ALKPHOS 60 49  BILITOT 0.9 0.8  PROT 6.7 5.6*  ALBUMIN 4.1 3.2*   CBG: No results for input(s): "GLUCAP" in the last 168 hours.  Discharge time spent: greater than 30 minutes.  Signed: Estill Cotta, MD [facility] 06/02/2022

## 2022-06-03 LAB — TYPE AND SCREEN
ABO/RH(D): AB POS
Antibody Screen: NEGATIVE
Unit division: 0
Unit division: 0

## 2022-06-03 LAB — BPAM RBC
Blood Product Expiration Date: 202402092359
Blood Product Expiration Date: 202402092359
Unit Type and Rh: 6200
Unit Type and Rh: 6200

## 2022-06-07 DIAGNOSIS — Z6830 Body mass index (BMI) 30.0-30.9, adult: Secondary | ICD-10-CM | POA: Diagnosis not present

## 2022-06-07 DIAGNOSIS — K922 Gastrointestinal hemorrhage, unspecified: Secondary | ICD-10-CM | POA: Diagnosis not present

## 2022-06-07 DIAGNOSIS — G459 Transient cerebral ischemic attack, unspecified: Secondary | ICD-10-CM | POA: Diagnosis not present

## 2022-06-07 DIAGNOSIS — G4733 Obstructive sleep apnea (adult) (pediatric): Secondary | ICD-10-CM | POA: Diagnosis not present

## 2022-06-07 DIAGNOSIS — E876 Hypokalemia: Secondary | ICD-10-CM | POA: Diagnosis not present

## 2022-06-07 DIAGNOSIS — D500 Iron deficiency anemia secondary to blood loss (chronic): Secondary | ICD-10-CM | POA: Diagnosis not present

## 2022-06-07 DIAGNOSIS — F411 Generalized anxiety disorder: Secondary | ICD-10-CM | POA: Diagnosis not present

## 2022-06-14 DIAGNOSIS — R0602 Shortness of breath: Secondary | ICD-10-CM | POA: Diagnosis not present

## 2022-06-14 DIAGNOSIS — G459 Transient cerebral ischemic attack, unspecified: Secondary | ICD-10-CM | POA: Diagnosis not present

## 2022-06-18 DIAGNOSIS — Q2112 Patent foramen ovale: Secondary | ICD-10-CM | POA: Diagnosis not present

## 2022-06-18 DIAGNOSIS — R0602 Shortness of breath: Secondary | ICD-10-CM | POA: Diagnosis not present

## 2022-06-18 DIAGNOSIS — Z8673 Personal history of transient ischemic attack (TIA), and cerebral infarction without residual deficits: Secondary | ICD-10-CM | POA: Diagnosis not present

## 2022-06-18 DIAGNOSIS — G459 Transient cerebral ischemic attack, unspecified: Secondary | ICD-10-CM | POA: Diagnosis not present

## 2022-06-20 DIAGNOSIS — F410 Panic disorder [episodic paroxysmal anxiety] without agoraphobia: Secondary | ICD-10-CM | POA: Diagnosis not present

## 2022-06-20 DIAGNOSIS — Z6831 Body mass index (BMI) 31.0-31.9, adult: Secondary | ICD-10-CM | POA: Diagnosis not present

## 2022-06-20 DIAGNOSIS — G4700 Insomnia, unspecified: Secondary | ICD-10-CM | POA: Diagnosis not present

## 2022-06-20 DIAGNOSIS — Z1331 Encounter for screening for depression: Secondary | ICD-10-CM | POA: Diagnosis not present

## 2022-06-20 DIAGNOSIS — T50905A Adverse effect of unspecified drugs, medicaments and biological substances, initial encounter: Secondary | ICD-10-CM | POA: Diagnosis not present

## 2022-06-20 DIAGNOSIS — F411 Generalized anxiety disorder: Secondary | ICD-10-CM | POA: Diagnosis not present

## 2022-06-20 DIAGNOSIS — R112 Nausea with vomiting, unspecified: Secondary | ICD-10-CM | POA: Diagnosis not present

## 2022-06-27 DIAGNOSIS — G4733 Obstructive sleep apnea (adult) (pediatric): Secondary | ICD-10-CM | POA: Diagnosis not present

## 2022-06-28 ENCOUNTER — Telehealth: Payer: Self-pay | Admitting: *Deleted

## 2022-06-28 NOTE — Telephone Encounter (Signed)
   Patient Name: [name] [name]  DOB: 01/30/1951 MRN: [id number]  Primary Cardiologist: None  Chart reviewed as part of pre-operative protocol coverage.   Patient follows with Dr. Terri Skains of [facility] cardiovascular.  Patient is not an active patient of [facility].  Recommendations for surgical clearance should therefore come from primary cardiologist, Dr. Terri Skains.  I will route this recommendation to the requesting party via Epic fax function and remove from pre-op pool.  Please call with questions.  Lenna Sciara, NP 06/28/2022, 8:05 AM

## 2022-06-28 NOTE — Telephone Encounter (Signed)
   Pre-operative Risk Assessment    Patient Name: [name] [name]  DOB: 12/04/1950 MRN: [id number]      Request for Surgical Clearance    Procedure:   COLONOSCOPY  Date of Surgery:  Clearance TBD                                 Surgeon:  DR. Ronnette Juniper Surgeon's Group or Practice Name:  [facility] Phone number:  [phone] Fax number:  [phone]   Type of Clearance Requested:   - Medical  - Pharmacy:  Hold Clopidogrel (Plavix) NOT INDICATED   Type of Anesthesia:  Not Indicated   Additional requests/questions:    Astrid Divine   06/28/2022, 6:40 AM

## 2022-07-03 ENCOUNTER — Ambulatory Visit
Admission: RE | Admit: 2022-07-03 | Discharge: 2022-07-03 | Disposition: A | Discharge status: Home / Self Care | Payer: Medicare PPO | Source: Ambulatory Visit | Attending: Student | Admitting: Student

## 2022-07-03 DIAGNOSIS — I3139 Other pericardial effusion (noninflammatory): Secondary | ICD-10-CM | POA: Diagnosis not present

## 2022-07-03 DIAGNOSIS — R0602 Shortness of breath: Secondary | ICD-10-CM | POA: Diagnosis not present

## 2022-07-03 DIAGNOSIS — R0600 Dyspnea, unspecified: Secondary | ICD-10-CM

## 2022-07-04 NOTE — Progress Notes (Unsigned)
Synopsis: Referred for dyspnea by [name], MD  Subjective:   PATIENT ID: [name] [name] GENDER: female DOB: 02/24/1951, MRN: [id number]  No chief complaint on file.  71yF with GERD, IDA, paradoxical embolus, ASD sp repair 03/2022 (amplatzer device), CVA, OSA, breast ca sp right mastectomy referred for dyspnea  Wheezing at [place] with cardiology 12/5   ED visit with [facility] 12/10  ED visit with dyspnea 12/16 with CT suggestive of atypical pna given doxy. Cough improved after course of doxy.   Had course of prednisone around 12/20 which she thinks helped.   Over last month she says she's had episodes of marked dyspnea, DOE with light activity. Takes her a while to get to a point of 'calming down.' Having trouble getting comfortable enough to lie down to go to sleep. She feels like her dyspnea is worse when she lies down.   She does think albuterol helped a bit. Never had asthma growing up but does have history of seasonal allergy and sinus surgery in 2005. Not sure if she has nasal poyps.  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 No smoking, vaping, MJ. No pets. No recent travel. She has never lived outside of [place].   Interval HPI ANCAs neg, IgE WNL, CT Chest interval resolution ggo  Started on breztri last visit  Unfortunately had admission for rectal bleeding 1/12  Otherwise pertinent review of systems is negative.  Past Medical History:  Diagnosis Date   Acute lower GI bleeding 10/25/2021   Aortic atherosclerosis ([place]) 10/25/2021   Cancer ([place])    Diverticulosis of intestine with bleeding 10/25/2021   GERD (gastroesophageal reflux disease) 10/25/2021   Hyperlipidemia 10/25/2021   Macular degeneration    PONV (postoperative nausea and vomiting)      Family History  Problem Relation Age of Onset   Stomach cancer Father    Dementia Paternal Grandmother    Stroke Paternal  Grandfather    BRCA 1/2 Neg Hx    Breast cancer Neg Hx    Sleep apnea Neg Hx      Past Surgical History:  Procedure Laterality Date   ABDOMINAL HYSTERECTOMY     BREAST EXCISIONAL BIOPSY Right    BREAST LUMPECTOMY WITH RADIOACTIVE SEED LOCALIZATION Right 01/18/2016   Procedure: RIGHT BREAST LUMPECTOMY WITH RADIOACTIVE SEED LOCALIZATION;  Surgeon: Erroll Luna, MD;  Location: [facility];  Service: General;  Laterality: Right;   BREAST SURGERY     BUBBLE STUDY  03/01/2022   Procedure: BUBBLE STUDY;  Surgeon: Rex Kras, DO;  Location: [facility];  Service: Cardiovascular;;   MASTECTOMY Left    PATENT FORAMEN OVALE(PFO) CLOSURE N/A 04/07/2022   Procedure: PATENT FORAMEN OVALE(PFO) CLOSURE;  Surgeon: Adrian Prows, MD;  Location: [facility];  Service: Cardiovascular;  Laterality: N/A;   TEE WITHOUT CARDIOVERSION N/A 03/01/2022   Procedure: TRANSESOPHAGEAL ECHOCARDIOGRAM (TEE);  Surgeon: Rex Kras, DO;  Location: [facility];  Service: Cardiovascular;  Laterality: N/A;   TURBINATE REDUCTION      Social History   Socioeconomic History   Marital status: Widowed    Spouse name: Not on file   Number of children: 1   Years of education: Not on file   Highest education level: Not on file  Occupational History   Not on file  Tobacco Use   Smoking status: Never    Passive exposure: Never   Smokeless  tobacco: Never  Vaping Use   Vaping Use: Never used  Substance and Sexual Activity   Alcohol use: No   Drug use: No   Sexual activity: Not on file  Other Topics Concern   Not on file  Social History Narrative   Not on file   Social Determinants of Health   Financial Resource Strain: Not on file  Food Insecurity: No Food Insecurity (05/30/2022)   Hunger Vital Sign    Worried About Running Out of Food in the Last Year: Never true    Ran Out of Food in the Last Year: Never true  Transportation Needs: No Transportation Needs (05/30/2022)   PRAPARE -  Hydrologist (Medical): No    Lack of Transportation (Non-Medical): No  Physical Activity: Not on file  Stress: Not on file  Social Connections: Not on file  Intimate Partner Violence: Not At Risk (05/30/2022)   Humiliation, Afraid, Rape, and Kick questionnaire    Fear of Current or Ex-Partner: No    Emotionally Abused: No    Physically Abused: No    Sexually Abused: No     Allergies  Allergen Reactions   Hydroxyzine Anxiety     Outpatient Medications Prior to Visit  Medication Sig Dispense Refill   atorvastatin (LIPITOR) 40 MG tablet Take 1 tablet (40 mg total) by mouth at bedtime. 30 tablet 11   Budeson-Glycopyrrol-Formoterol (BREZTRI AEROSPHERE) 160-9-4.8 MCG/ACT AERO Inhale 2 puffs into the lungs in the morning and at bedtime. (Patient taking differently: Inhale 1-2 puffs into the lungs in the morning and at bedtime.) 5.9 g 0   clopidogrel (PLAVIX) 75 MG tablet Take 1 tablet (75 mg total) by mouth daily. 90 tablet 0   ferrous sulfate 325 (65 FE) MG tablet Take 325 mg by mouth every Monday, Wednesday, and Friday.     fluticasone (FLONASE) 50 MCG/ACT nasal spray Place 1 spray into both nostrils daily as needed for allergies or rhinitis.     folic acid (FOLVITE) 1 MG tablet Take 1 mg by mouth daily.     melatonin 3 MG TABS tablet Take 1 tablet (3 mg total) by mouth at bedtime. Also available over-the-counter 30 tablet 3   mirtazapine (REMERON) 7.5 MG tablet Take 1 tablet (7.5 mg total) by mouth at bedtime. 30 tablet 1   OVER THE COUNTER MEDICATION Take 1 capsule by mouth daily. [place] with Astaxanthin capsules- Take 1 capsule by mouth every morning     OVER THE COUNTER MEDICATION Take 1 capsule by mouth daily. Kelli [place] Essential-1 with Vitamin D3-2000 capsules- Take 1 capsule by mouth once a day after breakfast     pantoprazole (PROTONIX) 40 MG tablet Take 1 tablet (40 mg total) by mouth daily. 30 tablet 3    polyethylene glycol (MIRALAX / GLYCOLAX) 17 g packet Take 17 g by mouth daily. (Patient taking differently: Take 17 g by mouth daily as needed for moderate constipation (with iron supplement).) 14 each 0   psyllium (HYDROCIL/METAMUCIL) 95 % PACK Take 1 packet by mouth 3 (three) times daily. Also available OTC 240 each 2   Vitamin D, Ergocalciferol, (DRISDOL) 1.25 MG (50000 UNIT) CAPS capsule Take 50,000 Units by mouth See admin instructions. Take 50,000 units by mouth every Wednesday and Saturday     No facility-administered medications prior to visit.       Objective:   Physical Exam:  General appearance: 72 y.o., female, NAD, conversant  Eyes: anicteric sclerae;  PERRL, tracking appropriately HENT: NCAT; MMM Neck: Trachea midline; no lymphadenopathy, no JVD Lungs: CTAB, no crackles, no wheeze, with normal respiratory effort CV: RRR, no murmur  Abdomen: Soft, non-tender; non-distended, BS present  Extremities: No peripheral edema, warm Skin: Normal turgor and texture; no rash Psych: Appropriate affect Neuro: Alert and oriented to person and place, no focal deficit     There were no vitals filed for this visit.    on RA BMI Readings from Last 3 Encounters:  05/30/22 31.00 kg/m  05/23/22 30.66 kg/m  05/23/22 30.59 kg/m   Wt Readings from Last 3 Encounters:  05/30/22 183 lb 6.8 oz (83.2 kg)  05/23/22 181 lb 6.4 oz (82.3 kg)  05/23/22 181 lb (82.1 kg)     CBC    Component Value Date/Time   WBC 7.5 06/02/2022 0543   RBC 2.65 (L) 06/02/2022 0543   HGB 7.7 (L) 06/02/2022 0543   HGB 12.6 04/25/2022 1506   HCT 24.4 (L) 06/02/2022 0543   HCT 38.5 04/25/2022 1506   PLT 290 06/02/2022 0543   PLT 475 (H) 04/25/2022 1506   MCV 92.1 06/02/2022 0543   MCV 86 04/25/2022 1506   MCH 29.1 06/02/2022 0543   MCHC 31.6 06/02/2022 0543   RDW 15.6 (H) 06/02/2022 0543   RDW 14.9 04/25/2022 1506   LYMPHSABS 3.0 05/30/2022 1118   LYMPHSABS 1.8 04/25/2022 1506   MONOABS 1.0  05/30/2022 1118   EOSABS 0.5 05/30/2022 1118   EOSABS 1.2 (H) 04/25/2022 1506   BASOSABS 0.1 05/30/2022 1118   BASOSABS 0.1 04/25/2022 1506    Eos 700-800  Chest Imaging: CTA chest reviewed by me with multifocal ggo upper lobe predominant, bronchial wall thickening  CT Chest 07/04/22 reviewed by me with interval resolution multifocal ggo. LLL subsegmental atelectasis, scar present  Pulmonary Functions Testing Results:     No data to display            Echocardiogram 05/17/22:    1. Left ventricular ejection fraction, by estimation, is 55 to 60%. Left  ventricular ejection fraction by 3D volume is 58 %. The left ventricle has  normal function. The left ventricle has no regional wall motion  abnormalities. Left ventricular diastolic   parameters are indeterminate.   2. Right ventricular systolic function is normal. The right ventricular  size is normal.   3. Left atrial size was mildly dilated.   4. 16 mm Amplatzer closure device seen across the atrial septum, however,  the device is protruding into the left atrium (possibly due to leftward  atrial septal aneurysm). There is possible left to right color flow jet  noted (images 76 and 99), which may   represent a residual atrial septal defect.   5. The mitral valve is grossly normal. Trivial mitral valve  regurgitation.   6. The aortic valve is tricuspid. Aortic valve regurgitation is not  visualized.   7. The inferior vena cava is normal in size with greater than 50%  respiratory variability, suggesting right atrial pressure of 3 mmHg.       Assessment & Plan:   # Eosinophilia # Multifocal ggo # Dyspnea # Delayed sleep onset insomnia # OSA prescribed BiPAP  Not sure if she simply had atypical pneumonia and has also developed eosinophilia related to uncontrolled asthma. Alternatively consider eosinophilic pna though no new medications raise concern for this other than possibly plavix. Systemic allergy and  eosinophilic reactions have been reported to nickel component of amplatzer closure device.   Plan: -  IgE, ANCAs - CT Chest in 6 weeks - prednisone taper - start breztri 2 puffs twice daily rinse mouth and brush tongue/teeth after each use. If you respond really well to it call us and we can consider filling prescription for this or something similar long term - if need refill of sleep aid would pursue through your neurologist. If you decide to transition sleep care to our clinic I'd be happy to ensure you're followed by one of our sleep trained pulmonologists. - see you in 8 weeks or sooner if need be!     Maryjane Hurter, MD [facility] 07/04/2022 1:05 PM

## 2022-07-05 ENCOUNTER — Ambulatory Visit (INDEPENDENT_AMBULATORY_CARE_PROVIDER_SITE_OTHER): Payer: Medicare PPO | Admitting: Student

## 2022-07-05 ENCOUNTER — Ambulatory Visit: Payer: Medicare PPO | Admitting: Student

## 2022-07-05 ENCOUNTER — Encounter: Payer: Self-pay | Admitting: Student

## 2022-07-05 VITALS — BP 124/72 | HR 94 | Temp 98.4°F | Ht 65.0 in | Wt 187.8 lb

## 2022-07-05 DIAGNOSIS — R0600 Dyspnea, unspecified: Secondary | ICD-10-CM | POA: Diagnosis not present

## 2022-07-05 LAB — PULMONARY FUNCTION TEST
DL/VA % pred: 126 %
DL/VA: 5.17 ml/min/mmHg/L
DLCO cor % pred: 115 %
DLCO cor: 22.91 ml/min/mmHg
DLCO unc % pred: 88 %
DLCO unc: 17.56 ml/min/mmHg
FEF 25-75 Post: 1.34 L/sec
FEF 25-75 Pre: 1.18 L/sec
FEF2575-%Change-Post: 13 %
FEF2575-%Pred-Post: 73 %
FEF2575-%Pred-Pre: 65 %
FEV1-%Change-Post: 3 %
FEV1-%Pred-Post: 76 %
FEV1-%Pred-Pre: 73 %
FEV1-Post: 1.73 L
FEV1-Pre: 1.67 L
FEV1FVC-%Change-Post: 3 %
FEV1FVC-%Pred-Pre: 94 %
FEV6-%Change-Post: 1 %
FEV6-%Pred-Post: 81 %
FEV6-%Pred-Pre: 80 %
FEV6-Post: 2.33 L
FEV6-Pre: 2.3 L
FEV6FVC-%Pred-Post: 104 %
FEV6FVC-%Pred-Pre: 104 %
FVC-%Change-Post: 0 %
FVC-%Pred-Post: 77 %
FVC-%Pred-Pre: 77 %
FVC-Post: 2.33 L
FVC-Pre: 2.34 L
Post FEV1/FVC ratio: 74 %
Post FEV6/FVC ratio: 100 %
Pre FEV1/FVC ratio: 71 %
Pre FEV6/FVC Ratio: 100 %
RV % pred: 107 %
RV: 2.47 L
TLC % pred: 94 %
TLC: 4.93 L

## 2022-07-05 NOTE — Progress Notes (Signed)
Full PFT Performed Today. 

## 2022-07-05 NOTE — Patient Instructions (Signed)
-   if worsening trouble breathing despite working on panic/anxiety then call [phone] and make appointment with [place], we could consider repeating chest x ray, restarting breztri for possible eosinophilic asthma, possibly also testing for nickel allergy (though the latter seems much less likely)

## 2022-07-05 NOTE — Patient Instructions (Signed)
Full PFT Performed Today. 

## 2022-07-07 DIAGNOSIS — Z6831 Body mass index (BMI) 31.0-31.9, adult: Secondary | ICD-10-CM | POA: Diagnosis not present

## 2022-07-07 DIAGNOSIS — E559 Vitamin D deficiency, unspecified: Secondary | ICD-10-CM | POA: Diagnosis not present

## 2022-07-07 DIAGNOSIS — F411 Generalized anxiety disorder: Secondary | ICD-10-CM | POA: Diagnosis not present

## 2022-07-10 ENCOUNTER — Other Ambulatory Visit: Payer: Self-pay | Admitting: Cardiology

## 2022-07-18 ENCOUNTER — Ambulatory Visit: Payer: Medicare PPO | Admitting: Cardiology

## 2022-07-18 ENCOUNTER — Encounter: Payer: Self-pay | Admitting: Cardiology

## 2022-07-18 VITALS — BP 142/79 | HR 91 | Resp 18 | Ht 65.0 in | Wt 189.2 lb

## 2022-07-18 DIAGNOSIS — I499 Cardiac arrhythmia, unspecified: Secondary | ICD-10-CM | POA: Diagnosis not present

## 2022-07-18 DIAGNOSIS — Z8673 Personal history of transient ischemic attack (TIA), and cerebral infarction without residual deficits: Secondary | ICD-10-CM

## 2022-07-18 DIAGNOSIS — Z8774 Personal history of (corrected) congenital malformations of heart and circulatory system: Secondary | ICD-10-CM

## 2022-07-18 DIAGNOSIS — I700 Atherosclerosis of aorta: Secondary | ICD-10-CM | POA: Diagnosis not present

## 2022-07-18 DIAGNOSIS — Q2112 Patent foramen ovale: Secondary | ICD-10-CM | POA: Diagnosis not present

## 2022-07-18 DIAGNOSIS — E782 Mixed hyperlipidemia: Secondary | ICD-10-CM

## 2022-07-18 DIAGNOSIS — R0602 Shortness of breath: Secondary | ICD-10-CM

## 2022-07-18 DIAGNOSIS — Z853 Personal history of malignant neoplasm of breast: Secondary | ICD-10-CM

## 2022-07-18 DIAGNOSIS — G459 Transient cerebral ischemic attack, unspecified: Secondary | ICD-10-CM

## 2022-07-18 MED ORDER — ASPIRIN 81 MG PO TBEC: 81.0000 mg | DELAYED_RELEASE_TABLET | Freq: Every day | ORAL | 12 refills | Status: DC

## 2022-07-18 NOTE — Progress Notes (Unsigned)
ID:  [name] [name], DOB 01/04/1951, MRN [id number]  PCP:  [name], MD  Cardiologist:  Rex Kras, DO, [facility] (established care 01/04/2022)  Date: 07/18/22 Last Office Visit: 05/23/2022  Chief Complaint  Patient presents with   Shortness of Breath   Follow-up    8 weeks    HPI  [name] [name] is a 72 y.o. Caucasian female whose  past medical history and cardiovascular risk factors include: Hyperlipidemia, obesity, obstructive sleep apnea, history of lower GI bleed, iron deficiency anemia, history of TIA and prior strokes, Patent PFO s/p 16 mm Amplatzer septal occlude, history of breast cancer status post right mastectomy, aortic atherosclerosis (CT scan 10/2021), a large hiatal hernia.  In June 2023 she was hospitalized for symptoms of aphasia and difficulty moving her eyes and was diagnosed with TIA.  During the hospitalization imaging studies revealed prior strokes.  She underwent a TEE and was noted to have a PFO.  She successfully underwent PFO closure in November 2023 and was asked to be on antibiotic prophylaxis for 6 months, to 1 tablet therapy for 3 months, and aspirin thereafter.  In the recent past she has been having dyspnea which was  treated with doxycycline as well as trial of prednisone.  She is also established care with pulmonary medicine.  Since last office visit she was hospitalized for acute blood loss anemia and plans to undergo endoscopy in February 2024.  Since last office visit she been started on BREZTRI AEROSPHERE inhaler for asthma as well as anxiolytics, shortness of breath has significantly improved.  She is trying her best to be compliant with CPAP but has difficulty tolerating the device.  She is no longer experiencing irregular heartbeat.  Since last office visit a Zio patch was performed and results reviewed with her in great detail today and noted below for further reference.  She is clinically euvolemic and not complaining of anginal  discomfort.  ALLERGIES: Allergies  Allergen Reactions   Hydroxyzine Anxiety    MEDICATION LIST PRIOR TO VISIT: No outpatient medications have been marked as taking for the 07/18/22 encounter (Office Visit) with Rex Kras, DO.     PAST MEDICAL HISTORY: Past Medical History:  Diagnosis Date   Acute lower GI bleeding 10/25/2021   Aortic atherosclerosis ([place]) 10/25/2021   Cancer ([place])    Diverticulosis of intestine with bleeding 10/25/2021   GERD (gastroesophageal reflux disease) 10/25/2021   Hyperlipidemia 10/25/2021   Macular degeneration    PONV (postoperative nausea and vomiting)     PAST SURGICAL HISTORY: Past Surgical History:  Procedure Laterality Date   ABDOMINAL HYSTERECTOMY     BREAST EXCISIONAL BIOPSY Right    BREAST LUMPECTOMY WITH RADIOACTIVE SEED LOCALIZATION Right 01/18/2016   Procedure: RIGHT BREAST LUMPECTOMY WITH RADIOACTIVE SEED LOCALIZATION;  Surgeon: Erroll Luna, MD;  Location: [facility];  Service: General;  Laterality: Right;   BREAST SURGERY     BUBBLE STUDY  03/01/2022   Procedure: BUBBLE STUDY;  Surgeon: Rex Kras, DO;  Location: [facility];  Service: Cardiovascular;;   MASTECTOMY Left    PATENT FORAMEN OVALE(PFO) CLOSURE N/A 04/07/2022   Procedure: PATENT FORAMEN OVALE(PFO) CLOSURE;  Surgeon: Adrian Prows, MD;  Location: [facility];  Service: Cardiovascular;  Laterality: N/A;   TEE WITHOUT CARDIOVERSION N/A 03/01/2022   Procedure: TRANSESOPHAGEAL ECHOCARDIOGRAM (TEE);  Surgeon: Rex Kras, DO;  Location: [facility];  Service: Cardiovascular;  Laterality: N/A;   TURBINATE REDUCTION      FAMILY HISTORY: The patient  family history includes Dementia in her paternal grandmother; Stomach cancer in her father; Stroke in her paternal grandfather.  SOCIAL HISTORY:  The patient  reports that she has never smoked. She has never been exposed to tobacco smoke. She has never used smokeless tobacco. She reports that she does not drink  alcohol and does not use drugs.  REVIEW OF SYSTEMS: Review of Systems  Constitutional: Negative for malaise/fatigue.  Cardiovascular:  Negative for chest pain, claudication, irregular heartbeat, leg swelling, near-syncope, orthopnea, palpitations, paroxysmal nocturnal dyspnea and syncope.  Respiratory:  Positive for shortness of breath. Negative for cough, sleep disturbances due to breathing, sputum production and wheezing.   Hematologic/Lymphatic: Negative for bleeding problem.  Musculoskeletal:  Negative for muscle cramps and myalgias.  Neurological:  Negative for dizziness and light-headedness.  Psychiatric/Behavioral:         Anxious    PHYSICAL EXAM:    07/05/2022    2:18 PM 06/02/2022    6:51 AM 06/01/2022    9:48 PM  Vitals with BMI  Height '5\' 5"'$     Weight 187 lbs 13 oz    BMI [id number]    Systolic [id number]  Diastolic 72 60 59  Pulse 94 82 85   Physical Exam  Constitutional: No distress.  Age appropriate, hemodynamically stable.   Neck: No JVD present.  Cardiovascular: Normal rate, regular rhythm, S1 normal, S2 normal, intact distal pulses and normal pulses. Exam reveals no gallop, no S3 and no S4.  No murmur heard. Pulses:      Dorsalis pedis pulses are 2+ on the right side and 2+ on the left side.       Posterior tibial pulses are 2+ on the right side and 2+ on the left side.  Pulmonary/Chest: Effort normal. No stridor. She has no wheezes. She has no rales.  Abdominal: Soft. Bowel sounds are normal. She exhibits no distension. There is no abdominal tenderness.  Musculoskeletal:        General: No edema.     Cervical back: Neck supple.  Neurological: She is alert and oriented to person, place, and time. She has intact cranial nerves (2-12).  Skin: Skin is warm and moist.   RADIOLOGY: CT angio GI bleed protocol: 10/25/2021: 1. No evidence of acute abnormalities within the abdomen or pelvis. 2. Diffuse colonic diverticulosis without evidence of acute  diverticulitis. 3. Large hiatal hernia. Aortic Atherosclerosis (ICD10-I70.0).  CT of the head without contrast: 11/15/2021 1. No evidence of acute intracranial abnormality. 2. Small remote appearing right cerebellar lacunar infarct. 3. Moderate paranasal sinus mucosal thickening.  MRI brain without contrast: 11/15/2021 1. Subtle 7 mm acute infarct versus artifact within the posterior right frontal lobe subcortical white matter. 2. Background mild cerebral white matter chronic small vessel ischemic disease. 3. Chronic lacunar infarcts within the posterior right lentiform nucleus and medial left thalamus. 4. Subcentimeter chronic infarcts within the bilateral cerebellar hemispheres. 5. Paranasal sinus disease, as described.  CARDIAC DATABASE: EKG: 05/23/2022: Sinus rhythm, 93 bpm, consider old inferior infarct, poor R wave progression.  No significant change compared to 03/14/2022.   Echocardiogram: 11/16/2021:  1. Left ventricular ejection fraction, by estimation, is 60 to 65%. The left ventricle has normal function. The left ventricle has no regional wall motion abnormalities. Left ventricular diastolic parameters are consistent with Grade I diastolic dysfunction (impaired relaxation).   2. Right ventricular systolic function is normal. The right ventricular size is normal.   3. The mitral valve is normal in structure. No evidence of  mitral valve regurgitation. No evidence of mitral stenosis.   4. The aortic valve is normal in structure. Aortic valve regurgitation is not visualized. No aortic stenosis is present.   5. The IVC is normal in size with greater than 50% respiratory variability, suggesting right atrial pressure of 3 mmHg.  Conclusion(s)/Recommendation(s): No intracardiac source of embolism detected on this transthoracic study. Consider a transesophageal echocardiogram to exclude cardiac source of embolism if clinically indicated.   TEE 03/01/2022:  1. Left ventricular ejection  fraction, by estimation, is 60 to 65%. The left ventricle has normal function. The left ventricle has no regional wall motion abnormalities.   2. Right ventricular systolic function is normal. The right ventricular size is normal.   3. No left atrial/left atrial appendage thrombus was detected.   4. The mitral valve is normal in structure. No evidence of mitral valve regurgitation. No evidence of mitral stenosis.   5. The aortic valve is tricuspid. Aortic valve regurgitation is not visualized. No aortic stenosis is present.   6. Evidence of atrial level shunting detected by color flow Doppler. Agitated saline contrast bubble study was positive with shunting observed within 3-6 cardiac cycles suggestive of interatrial shunt.   05/17/2022: LVEF 55-60%, no regional wall motion abnormalities, indeterminate diastolic function, right ventricular size and function normal, left atrium mildly dilated, 16 mm Amplatzer device across the atrial septum (possible left to right flow), trivial MR, RAP estimated at 3 mmHg  Stress Testing: Exercise Tetrofosmin stress test 01/24/2022: Exercise nuclear stress test was performed using Bruce protocol. Patient reached 7 METS, and 90% of age predicted maximum heart rate. Exercise capacity was low. No chest pain reported. Heart rate and hemodynamic response were normal. Stress EKG revealed no ischemic changes. Normal myocardial perfusion. Stress LVEF 63%. Low risk study.  Heart Catheterization: None  Atrial septal defect closure 04/03/2022: Successful closure of the atrial septal defect using a 16 mm Amplatzer septal occluder, although the defect was PFO, had secundum ASD anatomy and very complex septum with severe aneurysmal motion.   Patient will need antibiotic prophylaxis for 6 months.  She will be on aspirin indefinitely and Plavix for 90 days.  Cardiac monitor (Zio Patch): January 04, 2022-January 18, 2022 Dominant rhythm sinus, followed by tachycardia  (15%). Heart rate 50-193 bpm.  Avg HR 84 bpm. No atrial fibrillation, ventricular tachycardia, high grade AV block, pauses (3 seconds or longer). Total ventricular ectopic burden <1%. Total supraventricular ectopic burden <1%. Episodes of paroxysmal supraventricular tachycardia. Patient triggered events: 0.   Cardiac monitor (Zio Patch): May 23, 2022 - June 06, 2022 Dominant rhythm sinus, followed by tachycardia (23% burden). Heart rate 49-197 bpm.  Avg HR 89 bpm. No atrial fibrillation, ventricular tachycardia, high grade AV block, pauses (3 seconds or longer). Total ventricular ectopic burden <1%. Total supraventricular ectopic burden <1%. 28 episodes of supraventricular tachycardia. Fastest episode occurred on 05/23/2022 at 4:01pm 56 beats, 21.4 seconds, peak HR 197 bpm, avg HR 159bpm -likely atrial tachycardia (long RP tachycardia). Longest episode occurred on 06/05/2022 at 9:26am 207 beats, 1 minute 19 seconds, avg HR 157bpm, max HR 185bpm.  Patient triggered events: 11.  Sinus w/ rare PVCs.  LABORATORY DATA:    Latest Ref Rng & Units 06/02/2022    5:43 AM 06/01/2022    3:36 PM 06/01/2022    7:12 AM  CBC  WBC 4.0 - 10.5 K/uL 7.5   6.4   Hemoglobin 12.0 - 15.0 g/dL 7.7  7.7  7.7   Hematocrit 36.0 -  46.0 % 24.4  25.1  25.1   Platelets 150 - 400 K/uL 290   294        Latest Ref Rng & Units 06/01/2022    7:12 AM 05/31/2022    6:17 AM 05/30/2022   11:18 AM  CMP  Glucose 70 - 99 mg/dL 114  93  121   BUN 8 - 23 mg/dL 6  16  32   Creatinine 0.44 - 1.00 mg/dL 0.68  0.65  0.80   Sodium 135 - 145 mmol/L 140  138  137   Potassium 3.5 - 5.1 mmol/L 3.2  3.8  3.3   Chloride 98 - 111 mmol/L 113  111  107   CO2 22 - 32 mmol/L '21  21  21   '$ Calcium 8.9 - 10.3 mg/dL 7.9  7.9  8.7   Total Protein 6.5 - 8.1 g/dL  5.6  6.7   Total Bilirubin 0.3 - 1.2 mg/dL  0.8  0.9   Alkaline Phos 38 - 126 U/L  49  60   AST 15 - 41 U/L  19  24   ALT 0 - 44 U/L  27  31     Lipid Panel     Component  Value Date/Time   CHOL 158 11/15/2021 1130   TRIG 143 11/15/2021 1130   HDL 49 11/15/2021 1130   CHOLHDL 3.2 11/15/2021 1130   VLDL 29 11/15/2021 1130   LDLCALC 80 11/15/2021 1130    No components found for: "NTPROBNP" Recent Labs    04/25/22 1504  PROBNP 97   Recent Labs    11/15/21 1130  TSH 1.110    BMP Recent Labs    05/30/22 1118 05/31/22 0617 06/01/22 0712  NA 137 138 140  K 3.3* 3.8 3.2*  CL 107 111 113*  CO2 21* 21* 21*  GLUCOSE 121* 93 114*  BUN 32* 16 6*  CREATININE 0.80 0.65 0.68  CALCIUM 8.7* 7.9* 7.9*  GFRNONAA >60 >60 >60    HEMOGLOBIN A1C Lab Results  Component Value Date   HGBA1C 5.4 11/15/2021   MPG 108 11/15/2021    IMPRESSION:    ICD-10-CM   1. Shortness of breath  R06.02     2. TIA (transient ischemic attack)  G45.9     3. History of stroke  Z86.73     4. PFO (patent foramen ovale)  Q21.12     5. Status post device closure of PFO  Z87.74     6. Mixed hyperlipidemia  E78.2     7. Aortic atherosclerosis (HCC)  I70.0     8. HX: breast cancer  Z85.3     9. Irregular heart beat  I49.9        RECOMMENDATIONS: REVAN STROJNY is a 72 y.o. Caucasian female whose past medical history and cardiac risk factors include: Hyperlipidemia, obesity, obstructive sleep apnea, history of lower GI bleed, iron deficiency anemia, history of TIA and prior strokes, patent foramen ovale, sleep apnea awaiting CPAP, history of breast cancer status post right mastectomy, aortic atherosclerosis (CT scan 10/2021), a large hiatal hernia.  Shortness of breath: Improved.  Clinically euvolemic and not in heart failure. Currently on BREZTRI AEROSPHERE inhaler and anxiolytics which have helped her.  Zio patch noted no Afib but did note episodes of AT - discussed medical management; however, she would like to wait as she feels better.  Will continue to monitor  TIA (transient ischemic attack) / History of stroke Index event in June  2023. She has had prior  bilateral strokes TEE: Preserved EF, sonicated saline study and color Doppler positive for PFO. Zio patch: No documented A-fib. MPI: Low risk stress test. She has been evaluated for sleep apnea and she is trying her best to get acclimated with her device  Educate on importance of secondary prevention.  Status post PFO closure Status post 3m Amplatzer septal occluder. She has completed 90 days of dual anticoagulant therapy..  Discontinue Plavix.  Start aspirin 81 mg p.o. daily No antibiotic prophylaxis needed prior to upcoming endoscopy-spoke to Dr. GEinar Gipimplanted the device.  Mixed hyperlipidemia Currently on Lipitor.   She denies myalgia or other side effects. Most recent lipids dated June 2023, independently reviewed as noted above. Recommend a goal LDL <70 mg/dL. Currently managed by primary care provider.   FINAL MEDICATION LIST END OF ENCOUNTER: No orders of the defined types were placed in this encounter.   There are no discontinued medications.    Current Outpatient Medications:    albuterol (VENTOLIN HFA) 108 (90 Base) MCG/ACT inhaler, SMARTSIG:1-2 Puff(s) Via Inhaler Every 4-6 Hours PRN, Disp: , Rfl:    atorvastatin (LIPITOR) 40 MG tablet, Take 1 tablet (40 mg total) by mouth at bedtime., Disp: 30 tablet, Rfl: 11   Budeson-Glycopyrrol-Formoterol (BREZTRI AEROSPHERE) 160-9-4.8 MCG/ACT AERO, Inhale 2 puffs into the lungs in the morning and at bedtime. (Patient taking differently: Inhale 1-2 puffs into the lungs in the morning and at bedtime.), Disp: 5.9 g, Rfl: 0   clonazePAM (KLONOPIN) 0.5 MG tablet, Take 0.5 mg by mouth 2 (two) times daily as needed., Disp: , Rfl:    clopidogrel (PLAVIX) 75 MG tablet, Take 1 tablet (75 mg total) by mouth daily., Disp: 90 tablet, Rfl: 0   escitalopram (LEXAPRO) 10 MG tablet, Take by mouth. (Patient not taking: Reported on 07/05/2022), Disp: , Rfl:    famotidine (PEPCID) 20 MG tablet, TAKE 1 TABLET BY MOUTH TWICE A DAY, Disp: 60 tablet,  Rfl: 1   ferrous sulfate 325 (65 FE) MG tablet, Take 325 mg by mouth every Monday, Wednesday, and Friday., Disp: , Rfl:    fluticasone (FLONASE) 50 MCG/ACT nasal spray, Place 1 spray into both nostrils daily as needed for allergies or rhinitis., Disp: , Rfl:    folic acid (FOLVITE) 1 MG tablet, Take 1 mg by mouth daily., Disp: , Rfl:    melatonin 3 MG TABS tablet, Take 1 tablet (3 mg total) by mouth at bedtime. Also available over-the-counter, Disp: 30 tablet, Rfl: 3   mirtazapine (REMERON) 7.5 MG tablet, Take 1 tablet (7.5 mg total) by mouth at bedtime., Disp: 30 tablet, Rfl: 1   ondansetron (ZOFRAN) 4 MG tablet, Take 4 mg by mouth every 4 (four) hours., Disp: , Rfl:    OVER THE COUNTER MEDICATION, Take 1 capsule by mouth daily. [place] Astaxanthin capsules- Take 1 capsule by mouth every morning, Disp: , Rfl:    OVER THE COUNTER MEDICATION, Take 1 capsule by mouth daily. AKelli HopeEssential-1 with Vitamin D3-2000 capsules- Take 1 capsule by mouth once a day after breakfast, Disp: , Rfl:    pantoprazole (PROTONIX) 40 MG tablet, Take 1 tablet (40 mg total) by mouth daily., Disp: 30 tablet, Rfl: 3   polyethylene glycol (MIRALAX / GLYCOLAX) 17 g packet, Take 17 g by mouth daily. (Patient taking differently: Take 17 g by mouth daily as needed for moderate constipation (with iron supplement).), Disp: 14 each, Rfl: 0   psyllium (HYDROCIL/METAMUCIL) 95 %  PACK, Take 1 packet by mouth 3 (three) times daily. Also available OTC, Disp: 240 each, Rfl: 2   Vitamin D, Ergocalciferol, (DRISDOL) 1.25 MG (50000 UNIT) CAPS capsule, Take 50,000 Units by mouth See admin instructions. Take 50,000 units by mouth every Wednesday and Saturday, Disp: , Rfl:   No orders of the defined types were placed in this encounter.   There are no Patient Instructions on file for this visit.   --Continue cardiac medications as reconciled in final medication list. --No follow-ups on file. or  sooner if needed. --Continue follow-up with your primary care physician regarding the management of your other chronic comorbid conditions.  Patient's questions and concerns were addressed to her satisfaction. She voices understanding of the instructions provided during this encounter.   This note was created using a voice recognition software as a result there may be grammatical errors inadvertently enclosed that do not reflect the nature of this encounter. Every attempt is made to correct such errors.  Rex Kras, [place], [facility]  Pager: [phone] Office: [phone]

## 2022-07-19 ENCOUNTER — Encounter: Payer: Self-pay | Admitting: Cardiology

## 2022-07-26 ENCOUNTER — Telehealth: Payer: Self-pay

## 2022-07-26 DIAGNOSIS — G4733 Obstructive sleep apnea (adult) (pediatric): Secondary | ICD-10-CM | POA: Diagnosis not present

## 2022-07-26 NOTE — Telephone Encounter (Signed)
Spoke with patient about colonoscopy cardiac clearance. She agreed to discontinue her blood thinner 7 days prior to procedure and will restart after procedure per the recommendation of GI doctor. She had no further questions.

## 2022-08-02 DIAGNOSIS — D128 Benign neoplasm of rectum: Secondary | ICD-10-CM | POA: Diagnosis not present

## 2022-08-02 DIAGNOSIS — K5730 Diverticulosis of large intestine without perforation or abscess without bleeding: Secondary | ICD-10-CM | POA: Diagnosis not present

## 2022-08-02 DIAGNOSIS — D123 Benign neoplasm of transverse colon: Secondary | ICD-10-CM | POA: Diagnosis not present

## 2022-08-02 DIAGNOSIS — K625 Hemorrhage of anus and rectum: Secondary | ICD-10-CM | POA: Diagnosis not present

## 2022-08-02 DIAGNOSIS — K648 Other hemorrhoids: Secondary | ICD-10-CM | POA: Diagnosis not present

## 2022-08-02 DIAGNOSIS — K644 Residual hemorrhoidal skin tags: Secondary | ICD-10-CM | POA: Diagnosis not present

## 2022-08-04 DIAGNOSIS — D128 Benign neoplasm of rectum: Secondary | ICD-10-CM | POA: Diagnosis not present

## 2022-08-16 DIAGNOSIS — E669 Obesity, unspecified: Secondary | ICD-10-CM | POA: Diagnosis not present

## 2022-08-16 DIAGNOSIS — F411 Generalized anxiety disorder: Secondary | ICD-10-CM | POA: Diagnosis not present

## 2022-08-16 DIAGNOSIS — E559 Vitamin D deficiency, unspecified: Secondary | ICD-10-CM | POA: Diagnosis not present

## 2022-08-16 DIAGNOSIS — J8283 Eosinophilic asthma: Secondary | ICD-10-CM | POA: Diagnosis not present

## 2022-08-16 DIAGNOSIS — D500 Iron deficiency anemia secondary to blood loss (chronic): Secondary | ICD-10-CM | POA: Diagnosis not present

## 2022-08-26 DIAGNOSIS — G4733 Obstructive sleep apnea (adult) (pediatric): Secondary | ICD-10-CM | POA: Diagnosis not present

## 2022-09-05 ENCOUNTER — Other Ambulatory Visit: Payer: Self-pay | Admitting: Cardiology

## 2022-09-06 ENCOUNTER — Ambulatory Visit: Payer: Medicare PPO | Admitting: Adult Health

## 2022-09-20 ENCOUNTER — Ambulatory Visit: Payer: Medicare PPO | Admitting: Neurology

## 2022-09-20 ENCOUNTER — Encounter: Payer: Self-pay | Admitting: Neurology

## 2022-09-20 VITALS — BP 135/76 | HR 59 | Ht 64.5 in | Wt 190.0 lb

## 2022-09-20 DIAGNOSIS — G4733 Obstructive sleep apnea (adult) (pediatric): Secondary | ICD-10-CM | POA: Diagnosis not present

## 2022-09-20 NOTE — Patient Instructions (Signed)
It was nice to see you again today. I am glad to hear, things are going well with your autoPAP therapy. You have adjusted well to treatment with your new machine, and you are compliant with it. You have also fulfilled the insurance-mandated compliance percentage, which is reassuring, so you can get ongoing supplies through your insurance. Please talk to your DME provider about getting replacement supplies on a regular basis. Please be sure to change your filter every month, your mask about every 3 months, hose about every 6 months, humidifier chamber about yearly. Some restrictions are imposed by your insurance carrier with regard to how frequently you can get certain supplies.  Your DME company can provide further details if necessary.   Please continue using your autoPAP regularly. While your insurance requires that you use PAP at least 4 hours each night on 70% of the nights, I recommend, that you not skip any nights and use it throughout the night if you can. Getting used to PAP and staying with the treatment long term does take time and patience and discipline. Untreated obstructive sleep apnea when it is moderate to severe can have an adverse impact on cardiovascular health and raise her risk for heart disease, arrhythmias, hypertension, congestive heart failure, stroke and diabetes. Untreated obstructive sleep apnea causes sleep disruption, nonrestorative sleep, and sleep deprivation. This can have an impact on your day to day functioning and cause daytime sleepiness and impairment of cognitive function, memory loss, mood disturbance, and problems focussing. Using PAP regularly can improve these symptoms.  We can see you in 1 year, you can see one of our nurse practitioners as you are stable.   

## 2022-09-20 NOTE — Progress Notes (Signed)
Subjective:    Patient ID: [name] [name] is a 72 y.o. female.  HPI    Interim history:   Ms. Quackenbush is a 72 year old right-handed woman with an underlying medical history of stroke, recent TIA, diverticulosis, lower GI bleed, macular degeneration, reflux disease, hyperlipidemia, breast cancer with status post mastectomy, and mildly overweight state, who presents for follow-up consultation of her obstructive sleep apnea after interim testing and starting home AutoPap therapy.  The patient is unaccompanied today.  I first met her at the request of Ihor Austin, NP and Dr. Pearlean Brownie on 01/10/2022, at which time the patient reported a prior diagnosis of obstructive sleep apnea and trying PAP therapy.  She was encouraged by her cardiologist to get reevaluated.  She was no longer using PAP therapy.  She was advised to proceed with a sleep study.  She had a baseline sleep study through our sleep lab on 02/14/2022 which showed a total AHI of 13.5/h, REM AHI of 40.5/h, supine AHI of 22.8/h, O2 nadir 80%.  She was advised to proceed with home AutoPap therapy.  Her set up date was 06/27/2022.  She has a ResMed air sense 10 AutoSet machine.  Her DME company is [employer].   Today, 09/20/2022: I reviewed her AutoPap compliance data from 08/20/2022 through 09/18/2022, which is a total of 30 days, during which time she was 29% days greater than 4 hours at 100%, indicating superb compliance with an average usage of 8 hours and 18 minutes, residual AHI at goal at 2.9/h, average pressure for the 95th percentile at 10.5 cm with a range of 5 to 11 cm, leak on the low side with the 95th percentile at 3.9 L/min.  She reports that she has adjusted well to treatment.  She has benefited from treatment, her sleep consolidation is better, sleep is more restful, nocturia is better as well and she no longer snores.  She is very motivated to continue with treatment.  Of note, she had PFO closure in November 2023.  She has had issues with rectal  bleeding and acute GI bleed in the interim and has had several ED visits in December 2023.  She has had palpitations and panic feeling and was treated symptomatically with clonazepam through PCP.  In particular, shortly after her PFO closure she felt she could not sleep.  Clonazepam was a big help but she needed to come off of it.  She is now on Lexapro on a daily basis and no longer takes clonazepam.  She is still adjusting to her AutoPap, sometimes when it first starts, she feels that the pressure is not too high.  We talked about different modalities we can change such as increasing the minimal pressure, reducing or eliminating the ramp feature.  She would like to keep the settings the same for now.  The patient's allergies, current medications, family history, past medical history, past social history, past surgical history and problem list were reviewed and updated as appropriate.   Previously:   01/10/22: (She) was previously diagnosed with obstructive sleep apnea and placed on PAP therapy.  Prior sleep study results are not available for my review today. Testing was through [facility] sleep medicine in 2016 or 2017 and she had a machine since then.  She reports that she quit using her CPAP after summer 2020 as she had to leave town to be with her son and his family to help them out as he had COVID and was in quarantine.  When she came back  from [place] she had a hard time getting back on track with her CPAP machine.  I was able to review her download from 10/23/2018 through 01/20/2019, during which time she used her machine fairly consistently for 19 days straight but no usage after 11/03/2018.  Her DME company was [employer].  She has no family history of sleep apnea.  She has been encouraged to get back on CPAP particularly by her cardiologist.  She reports an Epworth sleepiness score of 3 out of 24, fatigue severity score of 61 out of 63.  She has had weight gain over time, particularly after COVID  started, she gained about 30 pounds altogether.  She is trying to lose weight.  She retired earlier this month.  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  She has a follow-up with GI, she has a history of GI bleed earlier this year.  Bedtime is generally between 10 and 10:30 PM and rise time generally between 6:30 AM and 7:30 AM.  She has nocturia about twice per average night and denies recurrent morning headaches.  She feels that CPAP did help her when she first got on the machine, her pressure was averaging 11 cm on the latest download, pressure range of 4-16, she was on AutoPap, ResMed AirSense 10 AutoSet machine.  She drinks caffeine occasionally, has reduced her soda intake.  She drinks alcohol rarely, she is a non-smoker.    Her Past Medical History Is Significant For: Past Medical History:  Diagnosis Date   Acute lower GI bleeding 10/25/2021   Aortic atherosclerosis (HCC) 10/25/2021   Cancer (HCC)    Diverticulosis of intestine with bleeding 10/25/2021   GERD (gastroesophageal reflux disease) 10/25/2021   Hyperlipidemia 10/25/2021   Macular degeneration    PONV (postoperative nausea and vomiting)     Her Past Surgical History Is Significant For: Past Surgical History:  Procedure Laterality Date   ABDOMINAL HYSTERECTOMY     BREAST EXCISIONAL BIOPSY Right    BREAST LUMPECTOMY WITH RADIOACTIVE SEED LOCALIZATION Right 01/18/2016   Procedure: RIGHT BREAST LUMPECTOMY WITH RADIOACTIVE SEED LOCALIZATION;  Surgeon: Harriette Bouillon, MD;  Location:  SURGERY CENTER;  Service: General;  Laterality: Right;   BREAST SURGERY     BUBBLE STUDY  03/01/2022   Procedure: BUBBLE STUDY;  Surgeon: Tessa Lerner, DO;  Location: [facility];  Service: Cardiovascular;;   MASTECTOMY Left    PATENT FORAMEN OVALE(PFO) CLOSURE N/A 04/07/2022   Procedure: PATENT FORAMEN OVALE(PFO) CLOSURE;  Surgeon: Yates Decamp, MD;  Location: [facility];  Service:  Cardiovascular;  Laterality: N/A;   TEE WITHOUT CARDIOVERSION N/A 03/01/2022   Procedure: TRANSESOPHAGEAL ECHOCARDIOGRAM (TEE);  Surgeon: Tessa Lerner, DO;  Location: [facility];  Service: Cardiovascular;  Laterality: N/A;   TURBINATE REDUCTION      Her Family History Is Significant For: Family History  Problem Relation Age of Onset   Stomach cancer Father    Dementia Paternal Grandmother    Stroke Paternal Grandfather    BRCA 1/2 Neg Hx    Breast cancer Neg Hx    Sleep apnea Neg Hx     Her Social History Is Significant For: Social History   Socioeconomic History   Marital status: Widowed    Spouse name: Not on file   Number of children: 1   Years of education: Not on file   Highest education level: Not on file  Occupational History   Not on  file  Tobacco Use   Smoking status: Never    Passive exposure: Never   Smokeless tobacco: Never  Vaping Use   Vaping Use: Never used  Substance and Sexual Activity   Alcohol use: No   Drug use: No   Sexual activity: Not on file  Other Topics Concern   Not on file  Social History Narrative   Caffeine: soda 2-3 cups/day mid-day   Social Determinants of Health   Financial Resource Strain: Not on file  Food Insecurity: No Food Insecurity (05/30/2022)   Hunger Vital Sign    Worried About Running Out of Food in the Last Year: Never true    Ran Out of Food in the Last Year: Never true  Transportation Needs: No Transportation Needs (05/30/2022)   PRAPARE - Administrator, Civil Service (Medical): No    Lack of Transportation (Non-Medical): No  Physical Activity: Not on file  Stress: Not on file  Social Connections: Not on file    Her Allergies Are:  Allergies  Allergen Reactions   Hydroxyzine Anxiety  :   Her Current Medications Are:  Outpatient Encounter Medications as of 09/20/2022  Medication Sig   albuterol (VENTOLIN HFA) 108 (90 Base) MCG/ACT inhaler SMARTSIG:1-2 Puff(s) Via Inhaler Every 4-6 Hours PRN    aspirin EC 81 MG tablet Take 1 tablet (81 mg total) by mouth daily. Swallow whole.   atorvastatin (LIPITOR) 40 MG tablet Take 1 tablet (40 mg total) by mouth at bedtime.   clonazePAM (KLONOPIN) 0.5 MG tablet Take 0.5 mg by mouth 2 (two) times daily as needed.   escitalopram (LEXAPRO) 10 MG tablet Take 10 mg by mouth daily.   ferrous sulfate 325 (65 FE) MG tablet Take 325 mg by mouth every Monday, Wednesday, and Friday.   fluticasone (FLONASE) 50 MCG/ACT nasal spray Place 1 spray into both nostrils daily as needed for allergies or rhinitis.   folic acid (FOLVITE) 1 MG tablet Take 1 mg by mouth daily.   melatonin 3 MG TABS tablet Take 1 tablet (3 mg total) by mouth at bedtime. Also available over-the-counter   OVER THE COUNTER MEDICATION Take 1 capsule by mouth daily. Andrew Lessman's Ultimate Eye Support with Astaxanthin capsules- Take 1 capsule by mouth every morning   OVER THE COUNTER MEDICATION Take 1 capsule by mouth daily. Tiburcio Pea Essential-1 with Vitamin D3-2000 capsules- Take 1 capsule by mouth once a day after breakfast   pantoprazole (PROTONIX) 40 MG tablet Take 1 tablet (40 mg total) by mouth daily.   polyethylene glycol (MIRALAX / GLYCOLAX) 17 g packet Take 17 g by mouth daily. (Patient taking differently: Take 17 g by mouth daily as needed for moderate constipation (with iron supplement).)   psyllium (HYDROCIL/METAMUCIL) 95 % PACK Take 1 packet by mouth 3 (three) times daily. Also available OTC   Vitamin D, Ergocalciferol, (DRISDOL) 1.25 MG (50000 UNIT) CAPS capsule Take 50,000 Units by mouth See admin instructions. Take 50,000 units by mouth every Wednesday and Saturday   Budeson-Glycopyrrol-Formoterol (BREZTRI AEROSPHERE) 160-9-4.8 MCG/ACT AERO Inhale 2 puffs into the lungs in the morning and at bedtime. (Patient not taking: Reported on 09/20/2022)   [DISCONTINUED] ondansetron (ZOFRAN) 4 MG tablet Take 4 mg by mouth every 4 (four) hours. (Patient not taking: Reported on 09/20/2022)    No facility-administered encounter medications on file as of 09/20/2022.  :  Review of Systems:  Out of a complete 14 point review of systems, all are reviewed and negative with the exception  of these symptoms as listed below:   Review of Systems  Neurological:        Patient is here for initial PAP follow-up. Patient reports overall she is using her autopap nightly. She states she has to focus on inhaling and exhaling when she starts it at night. She has some trouble with drainage when she wakes up at night and ends up staying awake sometimes an hour once she awakens to use the bathroom. She had a PFO closure in November. She has had some issues with SOB and panic attacks but has seen pulmonology and is on a rescue inhaler. ESS 5.    Objective:  Neurological Exam  Physical Exam Physical Examination:   Vitals:   09/20/22 1358  BP: 135/76  Pulse: (!) 59    General Examination: The patient is a very pleasant 72 y.o. female in no acute distress. She appears well-developed and well-nourished and well groomed.   HEENT: Normocephalic, atraumatic, pupils are equal, round and reactive to light, tracking well-preserved, no obvious nystagmus.  Corrective eyeglasses in place.  Face is symmetric with normal facial animation, speech is clear without dysarthria, hypophonia or voice tremor.  Airway examination reveals stable findings. Small tonsils noted, she has a vascular growth/swelling on the left side of the tip of the tongue, this has been stable per patient for years. Tongue protrudes centrally and palate elevates symmetrically.     Chest: Clear to auscultation without wheezing, rhonchi or crackles noted.   Heart: S1+S2+0, regular and normal without murmurs, rubs or gallops noted.    Abdomen: Soft, non-tender and non-distended.   Extremities: There is nonpitting puffiness both distal lower extremities.     Skin: Warm and dry without trophic changes noted.    Musculoskeletal: exam  reveals no obvious joint deformities.    Neurologically:  Mental status: The patient is awake, alert and oriented in all 4 spheres. Her immediate and remote memory, attention, language skills and fund of knowledge are appropriate. There is no evidence of aphasia, agnosia, apraxia or anomia. Speech is clear with normal prosody and enunciation. Thought process is linear. Mood is normal and affect is normal.  Cranial nerves II - XII are as described above under HEENT exam.  Motor exam: Normal bulk, strength and tone is noted. There is no obvious tremor. Fine motor skills and coordination: grossly intact.  Cerebellar testing: No dysmetria or intention tremor. There is no truncal or gait ataxia.  Sensory exam: intact to light touch in the upper and lower extremities.  Gait, station and balance: She stands easily. No veering to one side is noted. No leaning to one side is noted. Posture is age-appropriate and stance is narrow based. Gait shows normal stride length and normal pace. No problems turning are noted.    Assessment and Plan:    In summary, REVELLA SHELTON is a very pleasant 72 year old right-handed woman with an underlying medical history of stroke, recent TIA, diverticulosis, lower GI bleed, macular degeneration, reflux disease, hyperlipidemia, breast cancer with status post mastectomy, and mildly overweight state, who presents for follow-up consultation of her obstructive sleep apnea after interim testing and starting home AutoPap therapy.  She had a baseline sleep study through our sleep lab on 02/14/2022 which showed a total AHI of 13.5/h, REM AHI of 40.5/h, supine AHI of 22.8/h, O2 nadir 80%.  She started home AutoPap therapy on 06/27/2022. She has a ResMed air sense 10 AutoSet machine.  Her DME company is [employer].  She is fully  compliant with treatment and has benefited from it.  She is highly commended for treatment adherence.  She is very motivated to continue with treatment.  She has had some  flareup of allergy symptoms and drainage, which has made uses a little difficult at times but overall she is fully compliant and doing well with it.  At this juncture, she is advised to follow-up routinely in sleep clinic to see our nurse practitioner in 1 year.  We talked about different options for her and since stopping the ramp time, increasing the minimum pressure but she would like to keep the settings the same.  Apnea is under good control.  We talked about her sleep study results and reviewed her compliance data in detail.  I answered all her questions today and she was in agreement with our plan. I spent 30 minutes in total face-to-face time and in reviewing records during pre-charting, more than 50% of which was spent in counseling and coordination of care, reviewing test results, reviewing medications and treatment regimen and/or in discussing or reviewing the diagnosis of OSA, the prognosis and treatment options. Pertinent laboratory and imaging test results that were available during this visit with the patient were reviewed by me and considered in my medical decision making (see chart for details).

## 2022-09-25 DIAGNOSIS — G4733 Obstructive sleep apnea (adult) (pediatric): Secondary | ICD-10-CM | POA: Diagnosis not present

## 2022-10-20 DIAGNOSIS — G4733 Obstructive sleep apnea (adult) (pediatric): Secondary | ICD-10-CM | POA: Diagnosis not present

## 2022-10-25 DIAGNOSIS — F411 Generalized anxiety disorder: Secondary | ICD-10-CM | POA: Diagnosis not present

## 2022-10-25 DIAGNOSIS — J8283 Eosinophilic asthma: Secondary | ICD-10-CM | POA: Diagnosis not present

## 2022-10-25 DIAGNOSIS — Z6832 Body mass index (BMI) 32.0-32.9, adult: Secondary | ICD-10-CM | POA: Diagnosis not present

## 2022-10-25 DIAGNOSIS — Z8719 Personal history of other diseases of the digestive system: Secondary | ICD-10-CM | POA: Diagnosis not present

## 2022-10-25 DIAGNOSIS — I700 Atherosclerosis of aorta: Secondary | ICD-10-CM | POA: Diagnosis not present

## 2022-10-26 DIAGNOSIS — G4733 Obstructive sleep apnea (adult) (pediatric): Secondary | ICD-10-CM | POA: Diagnosis not present

## 2022-11-25 DIAGNOSIS — G4733 Obstructive sleep apnea (adult) (pediatric): Secondary | ICD-10-CM | POA: Diagnosis not present

## 2022-11-28 DIAGNOSIS — K219 Gastro-esophageal reflux disease without esophagitis: Secondary | ICD-10-CM | POA: Diagnosis not present

## 2022-11-28 DIAGNOSIS — Z79899 Other long term (current) drug therapy: Secondary | ICD-10-CM | POA: Diagnosis not present

## 2022-11-28 DIAGNOSIS — J8283 Eosinophilic asthma: Secondary | ICD-10-CM | POA: Diagnosis not present

## 2022-11-28 DIAGNOSIS — R7303 Prediabetes: Secondary | ICD-10-CM | POA: Diagnosis not present

## 2022-11-28 DIAGNOSIS — E8889 Other specified metabolic disorders: Secondary | ICD-10-CM | POA: Diagnosis not present

## 2022-11-28 DIAGNOSIS — F411 Generalized anxiety disorder: Secondary | ICD-10-CM | POA: Diagnosis not present

## 2022-11-28 DIAGNOSIS — Z131 Encounter for screening for diabetes mellitus: Secondary | ICD-10-CM | POA: Diagnosis not present

## 2022-11-28 DIAGNOSIS — E782 Mixed hyperlipidemia: Secondary | ICD-10-CM | POA: Diagnosis not present

## 2022-11-28 DIAGNOSIS — G4733 Obstructive sleep apnea (adult) (pediatric): Secondary | ICD-10-CM | POA: Diagnosis not present

## 2022-12-04 DIAGNOSIS — Z0000 Encounter for general adult medical examination without abnormal findings: Secondary | ICD-10-CM | POA: Diagnosis not present

## 2022-12-04 DIAGNOSIS — Z1389 Encounter for screening for other disorder: Secondary | ICD-10-CM | POA: Diagnosis not present

## 2022-12-04 DIAGNOSIS — Z9989 Dependence on other enabling machines and devices: Secondary | ICD-10-CM | POA: Diagnosis not present

## 2022-12-04 DIAGNOSIS — E669 Obesity, unspecified: Secondary | ICD-10-CM | POA: Diagnosis not present

## 2022-12-13 DIAGNOSIS — R7303 Prediabetes: Secondary | ICD-10-CM | POA: Diagnosis not present

## 2022-12-13 DIAGNOSIS — F5081 Binge eating disorder: Secondary | ICD-10-CM | POA: Diagnosis not present

## 2022-12-13 DIAGNOSIS — F411 Generalized anxiety disorder: Secondary | ICD-10-CM | POA: Diagnosis not present

## 2022-12-13 DIAGNOSIS — G4733 Obstructive sleep apnea (adult) (pediatric): Secondary | ICD-10-CM | POA: Diagnosis not present

## 2022-12-13 DIAGNOSIS — K219 Gastro-esophageal reflux disease without esophagitis: Secondary | ICD-10-CM | POA: Diagnosis not present

## 2022-12-13 DIAGNOSIS — E6609 Other obesity due to excess calories: Secondary | ICD-10-CM | POA: Diagnosis not present

## 2022-12-13 DIAGNOSIS — Z6833 Body mass index (BMI) 33.0-33.9, adult: Secondary | ICD-10-CM | POA: Diagnosis not present

## 2022-12-13 DIAGNOSIS — J8283 Eosinophilic asthma: Secondary | ICD-10-CM | POA: Diagnosis not present

## 2022-12-13 DIAGNOSIS — E782 Mixed hyperlipidemia: Secondary | ICD-10-CM | POA: Diagnosis not present

## 2022-12-26 DIAGNOSIS — E6609 Other obesity due to excess calories: Secondary | ICD-10-CM | POA: Diagnosis not present

## 2022-12-26 DIAGNOSIS — G4733 Obstructive sleep apnea (adult) (pediatric): Secondary | ICD-10-CM | POA: Diagnosis not present

## 2022-12-26 DIAGNOSIS — J8283 Eosinophilic asthma: Secondary | ICD-10-CM | POA: Diagnosis not present

## 2022-12-26 DIAGNOSIS — Z6833 Body mass index (BMI) 33.0-33.9, adult: Secondary | ICD-10-CM | POA: Diagnosis not present

## 2022-12-26 DIAGNOSIS — E782 Mixed hyperlipidemia: Secondary | ICD-10-CM | POA: Diagnosis not present

## 2022-12-26 DIAGNOSIS — F5081 Binge eating disorder: Secondary | ICD-10-CM | POA: Diagnosis not present

## 2022-12-26 DIAGNOSIS — F411 Generalized anxiety disorder: Secondary | ICD-10-CM | POA: Diagnosis not present

## 2022-12-26 DIAGNOSIS — R7303 Prediabetes: Secondary | ICD-10-CM | POA: Diagnosis not present

## 2022-12-26 DIAGNOSIS — K219 Gastro-esophageal reflux disease without esophagitis: Secondary | ICD-10-CM | POA: Diagnosis not present

## 2023-01-03 DIAGNOSIS — Z09 Encounter for follow-up examination after completed treatment for conditions other than malignant neoplasm: Secondary | ICD-10-CM | POA: Diagnosis not present

## 2023-01-03 DIAGNOSIS — F411 Generalized anxiety disorder: Secondary | ICD-10-CM | POA: Diagnosis not present

## 2023-01-03 DIAGNOSIS — Z9989 Dependence on other enabling machines and devices: Secondary | ICD-10-CM | POA: Diagnosis not present

## 2023-01-03 DIAGNOSIS — R7303 Prediabetes: Secondary | ICD-10-CM | POA: Diagnosis not present

## 2023-01-03 DIAGNOSIS — Z8719 Personal history of other diseases of the digestive system: Secondary | ICD-10-CM | POA: Diagnosis not present

## 2023-01-03 DIAGNOSIS — E782 Mixed hyperlipidemia: Secondary | ICD-10-CM | POA: Diagnosis not present

## 2023-01-15 DIAGNOSIS — J8283 Eosinophilic asthma: Secondary | ICD-10-CM | POA: Diagnosis not present

## 2023-01-15 DIAGNOSIS — K219 Gastro-esophageal reflux disease without esophagitis: Secondary | ICD-10-CM | POA: Diagnosis not present

## 2023-01-15 DIAGNOSIS — R7303 Prediabetes: Secondary | ICD-10-CM | POA: Diagnosis not present

## 2023-01-15 DIAGNOSIS — F411 Generalized anxiety disorder: Secondary | ICD-10-CM | POA: Diagnosis not present

## 2023-01-15 DIAGNOSIS — G4733 Obstructive sleep apnea (adult) (pediatric): Secondary | ICD-10-CM | POA: Diagnosis not present

## 2023-01-15 DIAGNOSIS — E782 Mixed hyperlipidemia: Secondary | ICD-10-CM | POA: Diagnosis not present

## 2023-01-15 DIAGNOSIS — Z6833 Body mass index (BMI) 33.0-33.9, adult: Secondary | ICD-10-CM | POA: Diagnosis not present

## 2023-01-15 DIAGNOSIS — F5081 Binge eating disorder: Secondary | ICD-10-CM | POA: Diagnosis not present

## 2023-01-15 DIAGNOSIS — E6609 Other obesity due to excess calories: Secondary | ICD-10-CM | POA: Diagnosis not present

## 2023-01-16 ENCOUNTER — Ambulatory Visit: Payer: Medicare PPO | Admitting: Cardiology

## 2023-01-18 DIAGNOSIS — G4733 Obstructive sleep apnea (adult) (pediatric): Secondary | ICD-10-CM | POA: Diagnosis not present

## 2023-01-25 ENCOUNTER — Ambulatory Visit: Payer: Medicare PPO | Admitting: Cardiology

## 2023-01-25 ENCOUNTER — Encounter: Payer: Self-pay | Admitting: Cardiology

## 2023-01-25 VITALS — BP 125/69 | HR 93 | Resp 16 | Ht 64.0 in | Wt 189.0 lb

## 2023-01-25 DIAGNOSIS — Z853 Personal history of malignant neoplasm of breast: Secondary | ICD-10-CM

## 2023-01-25 DIAGNOSIS — Q2112 Patent foramen ovale: Secondary | ICD-10-CM | POA: Diagnosis not present

## 2023-01-25 DIAGNOSIS — R0602 Shortness of breath: Secondary | ICD-10-CM | POA: Diagnosis not present

## 2023-01-25 DIAGNOSIS — G459 Transient cerebral ischemic attack, unspecified: Secondary | ICD-10-CM

## 2023-01-25 DIAGNOSIS — Z8774 Personal history of (corrected) congenital malformations of heart and circulatory system: Secondary | ICD-10-CM

## 2023-01-25 DIAGNOSIS — I700 Atherosclerosis of aorta: Secondary | ICD-10-CM | POA: Diagnosis not present

## 2023-01-25 DIAGNOSIS — Z8673 Personal history of transient ischemic attack (TIA), and cerebral infarction without residual deficits: Secondary | ICD-10-CM | POA: Diagnosis not present

## 2023-01-25 DIAGNOSIS — E782 Mixed hyperlipidemia: Secondary | ICD-10-CM | POA: Diagnosis not present

## 2023-01-25 NOTE — Progress Notes (Signed)
ID:  AMBERIA SEIFERT, DOB Jul 02, 1950, MRN [id number]  PCP:  Sigmund Hazel, MD  Cardiologist:  Tessa Lerner, DO, [facility] (established care 01/04/2022)  Date: 01/25/23 Last Office Visit: 07/18/2022  Chief Complaint  Patient presents with   Shortness of Breath   Follow-up    6 month    HPI  [name] [name] is a 72 y.o. Caucasian female whose  past medical history and cardiovascular risk factors include: Hyperlipidemia, obesity, obstructive sleep apnea, history of lower GI bleed, iron deficiency anemia, history of TIA and prior strokes, Patent PFO s/p 16 mm Amplatzer septal occlude, history of breast cancer status post right mastectomy, aortic atherosclerosis (CT scan 10/2021), a large hiatal hernia.  In June 2023 she was diagnosed with TIA and during the hospitalization stroke workup revealed prior strokes per imaging studies as well.  She underwent transesophageal echocardiogram which noted PFO and she eventually underwent PFO closure in November 2023.  In the past that she has had dyspnea for multiple reasons-history of acute blood loss anemia, eosinophilia, groundglass opacity, etc.  She presents today for 72-month follow-up visit.  Her shortness of breath is relatively stable with no change in intensity frequency or duration.  She has joint: Health and wellness program and has lost weight.  She is congratulated on her efforts.  Outside labs independently reviewed-provided by the patient.  ALLERGIES: Allergies  Allergen Reactions   Hydroxyzine Anxiety    MEDICATION LIST PRIOR TO VISIT: Current Meds  Medication Sig   aspirin EC 81 MG tablet Take 1 tablet (81 mg total) by mouth daily. Swallow whole.   atorvastatin (LIPITOR) 40 MG tablet Take 1 tablet (40 mg total) by mouth at bedtime.   Budeson-Glycopyrrol-Formoterol (BREZTRI AEROSPHERE) 160-9-4.8 MCG/ACT AERO Inhale 2 puffs into the lungs in the morning and at bedtime.   escitalopram (LEXAPRO) 10 MG tablet Take 10 mg by mouth daily.    esomeprazole (NEXIUM) 10 MG packet Take 10 mg by mouth daily before breakfast.   ferrous sulfate 325 (65 FE) MG tablet Take 325 mg by mouth every Monday, Wednesday, and Friday.   fluticasone (FLONASE) 50 MCG/ACT nasal spray Place 1 spray into both nostrils daily as needed for allergies or rhinitis.   folic acid (FOLVITE) 1 MG tablet Take 1 mg by mouth daily.   melatonin 3 MG TABS tablet Take 1 tablet (3 mg total) by mouth at bedtime. Also available over-the-counter   OVER THE COUNTER MEDICATION Take 1 capsule by mouth daily. Andrew Lessman's Ultimate Eye Support with Astaxanthin capsules- Take 1 capsule by mouth every morning   OVER THE COUNTER MEDICATION Take 1 capsule by mouth daily. Tiburcio Pea Essential-1 with Vitamin D3-2000 capsules- Take 1 capsule by mouth once a day after breakfast   polyethylene glycol (MIRALAX / GLYCOLAX) 17 g packet Take 17 g by mouth daily. (Patient taking differently: Take 17 g by mouth daily as needed for moderate constipation (with iron supplement).)   psyllium (HYDROCIL/METAMUCIL) 95 % PACK Take 1 packet by mouth 3 (three) times daily. Also available OTC   Vitamin D, Ergocalciferol, (DRISDOL) 1.25 MG (50000 UNIT) CAPS capsule Take 50,000 Units by mouth See admin instructions. Take 50,000 units by mouth every Wednesday and Saturday     PAST MEDICAL HISTORY: Past Medical History:  Diagnosis Date   Acute lower GI bleeding 10/25/2021   Aortic atherosclerosis (HCC) 10/25/2021   Cancer (HCC)    Diverticulosis of intestine with bleeding 10/25/2021   GERD (gastroesophageal reflux disease) 10/25/2021   Hyperlipidemia  10/25/2021   Macular degeneration    PONV (postoperative nausea and vomiting)     PAST SURGICAL HISTORY: Past Surgical History:  Procedure Laterality Date   ABDOMINAL HYSTERECTOMY     BREAST EXCISIONAL BIOPSY Right    BREAST LUMPECTOMY WITH RADIOACTIVE SEED LOCALIZATION Right 01/18/2016   Procedure: RIGHT BREAST LUMPECTOMY WITH RADIOACTIVE SEED  LOCALIZATION;  Surgeon: Harriette Bouillon, MD;  Location: [facility];  Service: General;  Laterality: Right;   BREAST SURGERY     BUBBLE STUDY  03/01/2022   Procedure: BUBBLE STUDY;  Surgeon: Tessa Lerner, DO;  Location: [facility];  Service: Cardiovascular;;   MASTECTOMY Left    PATENT FORAMEN OVALE(PFO) CLOSURE N/A 04/07/2022   Procedure: PATENT FORAMEN OVALE(PFO) CLOSURE;  Surgeon: Yates Decamp, MD;  Location: [facility];  Service: Cardiovascular;  Laterality: N/A;   TEE WITHOUT CARDIOVERSION N/A 03/01/2022   Procedure: TRANSESOPHAGEAL ECHOCARDIOGRAM (TEE);  Surgeon: Tessa Lerner, DO;  Location: [facility];  Service: Cardiovascular;  Laterality: N/A;   TURBINATE REDUCTION      FAMILY HISTORY: The patient family history includes Dementia in her paternal grandmother; Stomach cancer in her father; Stroke in her paternal grandfather.  SOCIAL HISTORY:  The patient  reports that she has never smoked. She has never been exposed to tobacco smoke. She has never used smokeless tobacco. She reports that she does not drink alcohol and does not use drugs.  REVIEW OF SYSTEMS: Review of Systems  Constitutional: Negative for malaise/fatigue.  Cardiovascular:  Negative for chest pain, claudication, irregular heartbeat, leg swelling, near-syncope, orthopnea, palpitations, paroxysmal nocturnal dyspnea and syncope.  Respiratory:  Positive for shortness of breath. Negative for cough, sleep disturbances due to breathing, sputum production and wheezing.   Hematologic/Lymphatic: Negative for bleeding problem.  Musculoskeletal:  Negative for muscle cramps and myalgias.  Neurological:  Negative for dizziness and light-headedness.  Psychiatric/Behavioral:         Anxious    PHYSICAL EXAM:    01/25/2023    3:53 PM 09/20/2022    1:58 PM 07/18/2022    1:21 PM  Vitals with BMI  Height 5\' 4"  5' 4.5" 5\' 5"   Weight 189 lbs 190 lbs 189 lbs 3 oz  BMI 32.43 32.12 31.48  Systolic 125 135 841   Diastolic 69 76 79  Pulse 93 59 91   Physical Exam  Constitutional: No distress.  Age appropriate, hemodynamically stable.   Neck: No JVD present.  Cardiovascular: Normal rate, regular rhythm, S1 normal, S2 normal, intact distal pulses and normal pulses. Exam reveals no gallop, no S3 and no S4.  No murmur heard. Pulses:      Dorsalis pedis pulses are 2+ on the right side and 2+ on the left side.       Posterior tibial pulses are 2+ on the right side and 2+ on the left side.  Pulmonary/Chest: Effort normal. No stridor. She has no wheezes. She has no rales.  Abdominal: Soft. Bowel sounds are normal. She exhibits no distension. There is no abdominal tenderness.  Musculoskeletal:        General: No edema.     Cervical back: Neck supple.  Neurological: She is alert and oriented to person, place, and time. She has intact cranial nerves (2-12).  Skin: Skin is warm and moist.   RADIOLOGY: CT angio GI bleed protocol: 10/25/2021: 1. No evidence of acute abnormalities within the abdomen or pelvis. 2. Diffuse colonic diverticulosis without evidence of acute diverticulitis. 3. Large hiatal hernia. Aortic Atherosclerosis (ICD10-I70.0).  CT of the head without contrast: 11/15/2021 1. No evidence of acute intracranial abnormality. 2. Small remote appearing right cerebellar lacunar infarct. 3. Moderate paranasal sinus mucosal thickening.  MRI brain without contrast: 11/15/2021 1. Subtle 7 mm acute infarct versus artifact within the posterior right frontal lobe subcortical white matter. 2. Background mild cerebral white matter chronic small vessel ischemic disease. 3. Chronic lacunar infarcts within the posterior right lentiform nucleus and medial left thalamus. 4. Subcentimeter chronic infarcts within the bilateral cerebellar hemispheres. 5. Paranasal sinus disease, as described.  CARDIAC DATABASE: EKG: 05/23/2022: Sinus rhythm, 93 bpm, consider old inferior infarct, poor R wave progression.   No significant change compared to 03/14/2022.  01/25/2023: Sinus rhythm, 83 bpm, without underlying ischemia or injury pattern.  Echocardiogram: 11/16/2021: LVEF 60 to 65%, grade 1 diastolic dysfunction, see report for additional details.  TEE 03/01/2022:  1. Left ventricular ejection fraction, by estimation, is 60 to 65%. The left ventricle has normal function. The left ventricle has no regional wall motion abnormalities.   2. Right ventricular systolic function is normal. The right ventricular size is normal.   3. No left atrial/left atrial appendage thrombus was detected.   4. The mitral valve is normal in structure. No evidence of mitral valve regurgitation. No evidence of mitral stenosis.   5. The aortic valve is tricuspid. Aortic valve regurgitation is not visualized. No aortic stenosis is present.   6. Evidence of atrial level shunting detected by color flow Doppler. Agitated saline contrast bubble study was positive with shunting observed within 3-6 cardiac cycles suggestive of interatrial shunt.   05/17/2022: LVEF 55-60%, no regional wall motion abnormalities, indeterminate diastolic function, right ventricular size and function normal, left atrium mildly dilated, 16 mm Amplatzer device across the atrial septum (possible left to right flow), trivial MR, RAP estimated at 3 mmHg  Stress Testing: Exercise Tetrofosmin stress test 01/24/2022: Exercise nuclear stress test was performed using Bruce protocol. Patient reached 7 METS, and 90% of age predicted maximum heart rate. Exercise capacity was low. No chest pain reported. Heart rate and hemodynamic response were normal. Stress EKG revealed no ischemic changes. Normal myocardial perfusion. Stress LVEF 63%. Low risk study.  Heart Catheterization: None  Atrial septal defect closure 04/03/2022: Successful closure of the atrial septal defect using a 16 mm Amplatzer septal occluder, although the defect was PFO, had secundum ASD anatomy and very  complex septum with severe aneurysmal motion.   Patient will need antibiotic prophylaxis for 6 months.  She will be on aspirin indefinitely and Plavix for 90 days.  Cardiac monitor (Zio Patch): January 04, 2022-January 18, 2022 Dominant rhythm sinus, followed by tachycardia (15%). Heart rate 50-193 bpm.  Avg HR 84 bpm. No atrial fibrillation, ventricular tachycardia, high grade AV block, pauses (3 seconds or longer). Total ventricular ectopic burden <1%. Total supraventricular ectopic burden <1%. Episodes of paroxysmal supraventricular tachycardia. Patient triggered events: 0.   Cardiac monitor (Zio Patch): May 23, 2022 - June 06, 2022 Dominant rhythm sinus, followed by tachycardia (23% burden). Heart rate 49-197 bpm.  Avg HR 89 bpm. No atrial fibrillation, ventricular tachycardia, high grade AV block, pauses (3 seconds or longer). Total ventricular ectopic burden <1%. Total supraventricular ectopic burden <1%. 28 episodes of supraventricular tachycardia. Fastest episode occurred on 05/23/2022 at 4:01pm 56 beats, 21.4 seconds, peak HR 197 bpm, avg HR 159bpm -likely atrial tachycardia (long RP tachycardia). Longest episode occurred on 06/05/2022 at 9:26am 207 beats, 1 minute 19 seconds, avg HR 157bpm, max HR 185bpm.  Patient  triggered events: 11.  Sinus w/ rare PVCs.  LABORATORY DATA:    Latest Ref Rng & Units 06/02/2022    5:43 AM 06/01/2022    3:36 PM 06/01/2022    7:12 AM  CBC  WBC 4.0 - 10.5 K/uL 7.5   6.4   Hemoglobin 12.0 - 15.0 g/dL 7.7  7.7  7.7   Hematocrit 36.0 - 46.0 % 24.4  25.1  25.1   Platelets 150 - 400 K/uL 290   294        Latest Ref Rng & Units 06/01/2022    7:12 AM 05/31/2022    6:17 AM 05/30/2022   11:18 AM  CMP  Glucose 70 - 99 mg/dL 409  93  811   BUN 8 - 23 mg/dL 6  16  32   Creatinine 0.44 - 1.00 mg/dL 9.14  7.82  9.56   Sodium 135 - 145 mmol/L 140  138  137   Potassium 3.5 - 5.1 mmol/L 3.2  3.8  3.3   Chloride 98 - 111 mmol/L 113  111  107   CO2 22  - 32 mmol/L 21  21  21    Calcium 8.9 - 10.3 mg/dL 7.9  7.9  8.7   Total Protein 6.5 - 8.1 g/dL  5.6  6.7   Total Bilirubin 0.3 - 1.2 mg/dL  0.8  0.9   Alkaline Phos 38 - 126 U/L  49  60   AST 15 - 41 U/L  19  24   ALT 0 - 44 U/L  27  31     Lipid Panel     Component Value Date/Time   CHOL 158 11/15/2021 1130   TRIG 143 11/15/2021 1130   HDL 49 11/15/2021 1130   CHOLHDL 3.2 11/15/2021 1130   VLDL 29 11/15/2021 1130   LDLCALC 80 11/15/2021 1130    No components found for: "NTPROBNP" Recent Labs    04/25/22 1504  PROBNP 97   No results for input(s): "TSH" in the last 8760 hours.   BMP Recent Labs    05/30/22 1118 05/31/22 0617 06/01/22 0712  NA 137 138 140  K 3.3* 3.8 3.2*  CL 107 111 113*  CO2 21* 21* 21*  GLUCOSE 121* 93 114*  BUN 32* 16 6*  CREATININE 0.80 0.65 0.68  CALCIUM 8.7* 7.9* 7.9*  GFRNONAA >60 >60 >60    HEMOGLOBIN A1C Lab Results  Component Value Date   HGBA1C 5.4 11/15/2021   MPG 108 11/15/2021   External Labs: Collected: 01/03/2023 provided by the patient. Hemoglobin 12.7 g/dL, hematocrit 21.3%. Cholesterol 141, triglycerides 143, HDL 48, LDL calculated 68 Platelets 409   IMPRESSION:    ICD-10-CM   1. Shortness of breath  R06.02 EKG 12-Lead    2. TIA (transient ischemic attack)  G45.9     3. History of stroke  Z86.73     4. PFO (patent foramen ovale)  Q21.12     5. Status post device closure of PFO  Z87.74     6. Mixed hyperlipidemia  E78.2     7. Aortic atherosclerosis (HCC)  I70.0     8. HX: breast cancer  Z85.3        RECOMMENDATIONS: SABREE EVE is a 72 y.o. Caucasian female whose past medical history and cardiac risk factors include: Hyperlipidemia, obesity, obstructive sleep apnea, history of lower GI bleed, iron deficiency anemia, history of TIA and prior strokes, patent foramen ovale, sleep apnea awaiting CPAP, history of breast cancer  status post right mastectomy, aortic atherosclerosis (CT scan 10/2021), a large  hiatal hernia.  Shortness of breath Chronic and stable. Has undergone appropriate cardiovascular workup as outlined above. Clinically euvolemic and not in heart failure. EKG illustrates sinus rhythm. Prior Zio patch noted no evidence of atrial fibrillation during the monitoring period.  However she does have intermittent episodes of atrial tach.  TIA (transient ischemic attack) History of stroke Aortic atherosclerosis (HCC) Index event in June 2023. During her stroke workup imaging findings also noted prior bilateral strokes TEE: Preserved EF, sonicated saline study and color Doppler positive for PFO. Zio patch: No documented A-fib. MPI: Low risk stress test. Reemphasized the importance of secondary prevention with focus on improving her modifiable cardiovascular risk factors such as glycemic control, lipid management, blood pressure control, weight loss.  PFO (patent foramen ovale) Status post device closure of PFO Status post 16mm Amplatzer septal occluder.  Mixed hyperlipidemia Currently on atorvastatin. Most recent lipid profile reviewed. Her LDL levels are now <70 mg/dL. We discussed the role of further lowering the LDL levels to around 55 mg/dL given her cardiovascular risk factors and prior TIA/CVA.  We discussed both modalities-up titration of pharmacological therapy or continued weight loss management. Shared decision was to continue with her current weight loss program and to have her lipids rechecked in 3 to 6 months and based on the results we will consider up titration of medical therapy  FINAL MEDICATION LIST END OF ENCOUNTER: No orders of the defined types were placed in this encounter.   Medications Discontinued During This Encounter  Medication Reason   albuterol (VENTOLIN HFA) 108 (90 Base) MCG/ACT inhaler    clonazePAM (KLONOPIN) 0.5 MG tablet    pantoprazole (PROTONIX) 40 MG tablet       Current Outpatient Medications:    aspirin EC 81 MG tablet, Take 1  tablet (81 mg total) by mouth daily. Swallow whole., Disp: 30 tablet, Rfl: 12   atorvastatin (LIPITOR) 40 MG tablet, Take 1 tablet (40 mg total) by mouth at bedtime., Disp: 30 tablet, Rfl: 11   Budeson-Glycopyrrol-Formoterol (BREZTRI AEROSPHERE) 160-9-4.8 MCG/ACT AERO, Inhale 2 puffs into the lungs in the morning and at bedtime., Disp: 5.9 g, Rfl: 0   escitalopram (LEXAPRO) 10 MG tablet, Take 10 mg by mouth daily., Disp: , Rfl:    esomeprazole (NEXIUM) 10 MG packet, Take 10 mg by mouth daily before breakfast., Disp: , Rfl:    ferrous sulfate 325 (65 FE) MG tablet, Take 325 mg by mouth every Monday, Wednesday, and Friday., Disp: , Rfl:    fluticasone (FLONASE) 50 MCG/ACT nasal spray, Place 1 spray into both nostrils daily as needed for allergies or rhinitis., Disp: , Rfl:    folic acid (FOLVITE) 1 MG tablet, Take 1 mg by mouth daily., Disp: , Rfl:    melatonin 3 MG TABS tablet, Take 1 tablet (3 mg total) by mouth at bedtime. Also available over-the-counter, Disp: 30 tablet, Rfl: 3   OVER THE COUNTER MEDICATION, Take 1 capsule by mouth daily. Andrew Lessman's Ultimate Eye Support with Astaxanthin capsules- Take 1 capsule by mouth every morning, Disp: , Rfl:    OVER THE COUNTER MEDICATION, Take 1 capsule by mouth daily. Tiburcio Pea Essential-1 with Vitamin D3-2000 capsules- Take 1 capsule by mouth once a day after breakfast, Disp: , Rfl:    polyethylene glycol (MIRALAX / GLYCOLAX) 17 g packet, Take 17 g by mouth daily. (Patient taking differently: Take 17 g by mouth daily as needed for moderate constipation (  with iron supplement).), Disp: 14 each, Rfl: 0   psyllium (HYDROCIL/METAMUCIL) 95 % PACK, Take 1 packet by mouth 3 (three) times daily. Also available OTC, Disp: 240 each, Rfl: 2   Vitamin D, Ergocalciferol, (DRISDOL) 1.25 MG (50000 UNIT) CAPS capsule, Take 50,000 Units by mouth See admin instructions. Take 50,000 units by mouth every Wednesday and Saturday, Disp: , Rfl:   Orders Placed This  Encounter  Procedures   EKG 12-Lead    There are no Patient Instructions on file for this visit.   --Continue cardiac medications as reconciled in final medication list. --Return in about 6 months (around 07/25/2023) for Follow up, Dyspnea, hx of PFO closure. or sooner if needed. --Continue follow-up with your primary care physician regarding the management of your other chronic comorbid conditions.  Patient's questions and concerns were addressed to her satisfaction. She voices understanding of the instructions provided during this encounter.   This note was created using a voice recognition software as a result there may be grammatical errors inadvertently enclosed that do not reflect the nature of this encounter. Every attempt is made to correct such errors.  Tessa Lerner, [place], [facility]  Pager: [phone] Office: [phone]

## 2023-01-26 DIAGNOSIS — G4733 Obstructive sleep apnea (adult) (pediatric): Secondary | ICD-10-CM | POA: Diagnosis not present

## 2023-02-08 DIAGNOSIS — Z6833 Body mass index (BMI) 33.0-33.9, adult: Secondary | ICD-10-CM | POA: Diagnosis not present

## 2023-02-08 DIAGNOSIS — F411 Generalized anxiety disorder: Secondary | ICD-10-CM | POA: Diagnosis not present

## 2023-02-08 DIAGNOSIS — J8283 Eosinophilic asthma: Secondary | ICD-10-CM | POA: Diagnosis not present

## 2023-02-08 DIAGNOSIS — F5081 Binge eating disorder: Secondary | ICD-10-CM | POA: Diagnosis not present

## 2023-02-08 DIAGNOSIS — G4733 Obstructive sleep apnea (adult) (pediatric): Secondary | ICD-10-CM | POA: Diagnosis not present

## 2023-02-08 DIAGNOSIS — R7303 Prediabetes: Secondary | ICD-10-CM | POA: Diagnosis not present

## 2023-02-08 DIAGNOSIS — E6609 Other obesity due to excess calories: Secondary | ICD-10-CM | POA: Diagnosis not present

## 2023-02-08 DIAGNOSIS — K219 Gastro-esophageal reflux disease without esophagitis: Secondary | ICD-10-CM | POA: Diagnosis not present

## 2023-02-08 DIAGNOSIS — E782 Mixed hyperlipidemia: Secondary | ICD-10-CM | POA: Diagnosis not present

## 2023-02-25 DIAGNOSIS — G4733 Obstructive sleep apnea (adult) (pediatric): Secondary | ICD-10-CM | POA: Diagnosis not present

## 2023-03-08 DIAGNOSIS — F411 Generalized anxiety disorder: Secondary | ICD-10-CM | POA: Diagnosis not present

## 2023-03-08 DIAGNOSIS — E782 Mixed hyperlipidemia: Secondary | ICD-10-CM | POA: Diagnosis not present

## 2023-03-08 DIAGNOSIS — Z6833 Body mass index (BMI) 33.0-33.9, adult: Secondary | ICD-10-CM | POA: Diagnosis not present

## 2023-03-08 DIAGNOSIS — G4733 Obstructive sleep apnea (adult) (pediatric): Secondary | ICD-10-CM | POA: Diagnosis not present

## 2023-03-08 DIAGNOSIS — F5081 Binge eating disorder, mild: Secondary | ICD-10-CM | POA: Diagnosis not present

## 2023-03-08 DIAGNOSIS — E6609 Other obesity due to excess calories: Secondary | ICD-10-CM | POA: Diagnosis not present

## 2023-03-08 DIAGNOSIS — J8283 Eosinophilic asthma: Secondary | ICD-10-CM | POA: Diagnosis not present

## 2023-03-08 DIAGNOSIS — R7303 Prediabetes: Secondary | ICD-10-CM | POA: Diagnosis not present

## 2023-03-08 DIAGNOSIS — K219 Gastro-esophageal reflux disease without esophagitis: Secondary | ICD-10-CM | POA: Diagnosis not present

## 2023-03-20 DIAGNOSIS — M1990 Unspecified osteoarthritis, unspecified site: Secondary | ICD-10-CM | POA: Diagnosis not present

## 2023-03-20 DIAGNOSIS — H269 Unspecified cataract: Secondary | ICD-10-CM | POA: Diagnosis not present

## 2023-03-20 DIAGNOSIS — R030 Elevated blood-pressure reading, without diagnosis of hypertension: Secondary | ICD-10-CM | POA: Diagnosis not present

## 2023-03-20 DIAGNOSIS — J302 Other seasonal allergic rhinitis: Secondary | ICD-10-CM | POA: Diagnosis not present

## 2023-03-20 DIAGNOSIS — E785 Hyperlipidemia, unspecified: Secondary | ICD-10-CM | POA: Diagnosis not present

## 2023-03-20 DIAGNOSIS — G4733 Obstructive sleep apnea (adult) (pediatric): Secondary | ICD-10-CM | POA: Diagnosis not present

## 2023-03-20 DIAGNOSIS — F411 Generalized anxiety disorder: Secondary | ICD-10-CM | POA: Diagnosis not present

## 2023-03-20 DIAGNOSIS — E669 Obesity, unspecified: Secondary | ICD-10-CM | POA: Diagnosis not present

## 2023-03-20 DIAGNOSIS — K219 Gastro-esophageal reflux disease without esophagitis: Secondary | ICD-10-CM | POA: Diagnosis not present

## 2023-03-28 DIAGNOSIS — G4733 Obstructive sleep apnea (adult) (pediatric): Secondary | ICD-10-CM | POA: Diagnosis not present

## 2023-04-02 DIAGNOSIS — E782 Mixed hyperlipidemia: Secondary | ICD-10-CM | POA: Diagnosis not present

## 2023-04-02 DIAGNOSIS — F5081 Binge eating disorder, mild: Secondary | ICD-10-CM | POA: Diagnosis not present

## 2023-04-02 DIAGNOSIS — Z6833 Body mass index (BMI) 33.0-33.9, adult: Secondary | ICD-10-CM | POA: Diagnosis not present

## 2023-04-02 DIAGNOSIS — K219 Gastro-esophageal reflux disease without esophagitis: Secondary | ICD-10-CM | POA: Diagnosis not present

## 2023-04-02 DIAGNOSIS — F411 Generalized anxiety disorder: Secondary | ICD-10-CM | POA: Diagnosis not present

## 2023-04-02 DIAGNOSIS — J8283 Eosinophilic asthma: Secondary | ICD-10-CM | POA: Diagnosis not present

## 2023-04-02 DIAGNOSIS — G4733 Obstructive sleep apnea (adult) (pediatric): Secondary | ICD-10-CM | POA: Diagnosis not present

## 2023-04-02 DIAGNOSIS — R7303 Prediabetes: Secondary | ICD-10-CM | POA: Diagnosis not present

## 2023-04-02 DIAGNOSIS — E6609 Other obesity due to excess calories: Secondary | ICD-10-CM | POA: Diagnosis not present

## 2023-04-27 DIAGNOSIS — G4733 Obstructive sleep apnea (adult) (pediatric): Secondary | ICD-10-CM | POA: Diagnosis not present

## 2023-05-15 DIAGNOSIS — G4733 Obstructive sleep apnea (adult) (pediatric): Secondary | ICD-10-CM | POA: Diagnosis not present

## 2023-05-22 DIAGNOSIS — F5081 Binge eating disorder, mild: Secondary | ICD-10-CM | POA: Diagnosis not present

## 2023-05-22 DIAGNOSIS — J8283 Eosinophilic asthma: Secondary | ICD-10-CM | POA: Diagnosis not present

## 2023-05-22 DIAGNOSIS — E6609 Other obesity due to excess calories: Secondary | ICD-10-CM | POA: Diagnosis not present

## 2023-05-22 DIAGNOSIS — R7303 Prediabetes: Secondary | ICD-10-CM | POA: Diagnosis not present

## 2023-05-22 DIAGNOSIS — G4733 Obstructive sleep apnea (adult) (pediatric): Secondary | ICD-10-CM | POA: Diagnosis not present

## 2023-05-22 DIAGNOSIS — Z6831 Body mass index (BMI) 31.0-31.9, adult: Secondary | ICD-10-CM | POA: Diagnosis not present

## 2023-05-22 DIAGNOSIS — E782 Mixed hyperlipidemia: Secondary | ICD-10-CM | POA: Diagnosis not present

## 2023-05-22 DIAGNOSIS — K219 Gastro-esophageal reflux disease without esophagitis: Secondary | ICD-10-CM | POA: Diagnosis not present

## 2023-05-22 DIAGNOSIS — F411 Generalized anxiety disorder: Secondary | ICD-10-CM | POA: Diagnosis not present

## 2023-05-28 DIAGNOSIS — G4733 Obstructive sleep apnea (adult) (pediatric): Secondary | ICD-10-CM | POA: Diagnosis not present

## 2023-06-21 DIAGNOSIS — J8283 Eosinophilic asthma: Secondary | ICD-10-CM | POA: Diagnosis not present

## 2023-06-21 DIAGNOSIS — K219 Gastro-esophageal reflux disease without esophagitis: Secondary | ICD-10-CM | POA: Diagnosis not present

## 2023-06-21 DIAGNOSIS — E782 Mixed hyperlipidemia: Secondary | ICD-10-CM | POA: Diagnosis not present

## 2023-06-21 DIAGNOSIS — E6609 Other obesity due to excess calories: Secondary | ICD-10-CM | POA: Diagnosis not present

## 2023-06-21 DIAGNOSIS — R7303 Prediabetes: Secondary | ICD-10-CM | POA: Diagnosis not present

## 2023-06-21 DIAGNOSIS — G4733 Obstructive sleep apnea (adult) (pediatric): Secondary | ICD-10-CM | POA: Diagnosis not present

## 2023-06-21 DIAGNOSIS — F411 Generalized anxiety disorder: Secondary | ICD-10-CM | POA: Diagnosis not present

## 2023-06-21 DIAGNOSIS — F5081 Binge eating disorder, mild: Secondary | ICD-10-CM | POA: Diagnosis not present

## 2023-06-21 DIAGNOSIS — Z6831 Body mass index (BMI) 31.0-31.9, adult: Secondary | ICD-10-CM | POA: Diagnosis not present

## 2023-06-28 DIAGNOSIS — G4733 Obstructive sleep apnea (adult) (pediatric): Secondary | ICD-10-CM | POA: Diagnosis not present

## 2023-07-04 DIAGNOSIS — J4520 Mild intermittent asthma, uncomplicated: Secondary | ICD-10-CM | POA: Diagnosis not present

## 2023-07-04 DIAGNOSIS — Z8673 Personal history of transient ischemic attack (TIA), and cerebral infarction without residual deficits: Secondary | ICD-10-CM | POA: Diagnosis not present

## 2023-07-04 DIAGNOSIS — D509 Iron deficiency anemia, unspecified: Secondary | ICD-10-CM | POA: Diagnosis not present

## 2023-07-04 DIAGNOSIS — E669 Obesity, unspecified: Secondary | ICD-10-CM | POA: Diagnosis not present

## 2023-07-04 DIAGNOSIS — R7303 Prediabetes: Secondary | ICD-10-CM | POA: Diagnosis not present

## 2023-07-04 DIAGNOSIS — F411 Generalized anxiety disorder: Secondary | ICD-10-CM | POA: Diagnosis not present

## 2023-07-04 DIAGNOSIS — J8283 Eosinophilic asthma: Secondary | ICD-10-CM | POA: Diagnosis not present

## 2023-07-04 DIAGNOSIS — Z8719 Personal history of other diseases of the digestive system: Secondary | ICD-10-CM | POA: Diagnosis not present

## 2023-07-24 DIAGNOSIS — Z6831 Body mass index (BMI) 31.0-31.9, adult: Secondary | ICD-10-CM | POA: Diagnosis not present

## 2023-07-24 DIAGNOSIS — R7303 Prediabetes: Secondary | ICD-10-CM | POA: Diagnosis not present

## 2023-07-24 DIAGNOSIS — K219 Gastro-esophageal reflux disease without esophagitis: Secondary | ICD-10-CM | POA: Diagnosis not present

## 2023-07-24 DIAGNOSIS — G4733 Obstructive sleep apnea (adult) (pediatric): Secondary | ICD-10-CM | POA: Diagnosis not present

## 2023-07-24 DIAGNOSIS — F5081 Binge eating disorder, mild: Secondary | ICD-10-CM | POA: Diagnosis not present

## 2023-07-24 DIAGNOSIS — J8283 Eosinophilic asthma: Secondary | ICD-10-CM | POA: Diagnosis not present

## 2023-07-24 DIAGNOSIS — E6609 Other obesity due to excess calories: Secondary | ICD-10-CM | POA: Diagnosis not present

## 2023-07-24 DIAGNOSIS — E782 Mixed hyperlipidemia: Secondary | ICD-10-CM | POA: Diagnosis not present

## 2023-07-24 DIAGNOSIS — F411 Generalized anxiety disorder: Secondary | ICD-10-CM | POA: Diagnosis not present

## 2023-07-26 ENCOUNTER — Ambulatory Visit: Payer: Medicare PPO | Admitting: Cardiology

## 2023-07-30 ENCOUNTER — Ambulatory Visit: Payer: Medicare PPO | Attending: Cardiology | Admitting: Cardiology

## 2023-07-30 ENCOUNTER — Encounter: Payer: Self-pay | Admitting: Cardiology

## 2023-07-30 VITALS — BP 124/76 | HR 89 | Resp 16 | Ht 64.0 in | Wt 191.0 lb

## 2023-07-30 DIAGNOSIS — E782 Mixed hyperlipidemia: Secondary | ICD-10-CM

## 2023-07-30 DIAGNOSIS — Q2112 Patent foramen ovale: Secondary | ICD-10-CM

## 2023-07-30 DIAGNOSIS — R0602 Shortness of breath: Secondary | ICD-10-CM

## 2023-07-30 DIAGNOSIS — Z853 Personal history of malignant neoplasm of breast: Secondary | ICD-10-CM

## 2023-07-30 DIAGNOSIS — Z8774 Personal history of (corrected) congenital malformations of heart and circulatory system: Secondary | ICD-10-CM | POA: Diagnosis not present

## 2023-07-30 DIAGNOSIS — Z8673 Personal history of transient ischemic attack (TIA), and cerebral infarction without residual deficits: Secondary | ICD-10-CM

## 2023-07-30 DIAGNOSIS — G459 Transient cerebral ischemic attack, unspecified: Secondary | ICD-10-CM

## 2023-07-30 DIAGNOSIS — I700 Atherosclerosis of aorta: Secondary | ICD-10-CM | POA: Diagnosis not present

## 2023-07-30 NOTE — Patient Instructions (Addendum)
 Medication Instructions:  Your physician recommends that you continue on your current medications as directed. Please refer to the Current Medication list given to you today.  *If you need a refill on your cardiac medications before your next appointment, please call your pharmacy*  Lab Work: None ordered today. If you have labs (blood work) drawn today and your tests are completely normal, you will receive your results only by: MyChart Message (if you have MyChart) OR A paper copy in the mail If you have any lab test that is abnormal or we need to change your treatment, we will call you to review the results.  Testing/Procedures: None ordered today.  Follow-Up: At [facility], you and your health needs are our priority.  As part of our continuing mission to provide you with exceptional heart care, we have created designated Provider Care Teams.  These Care Teams include your primary Cardiologist (physician) and Advanced Practice Providers (APPs -  Physician Assistants and Nurse Practitioners) who all work together to provide you with the care you need, when you need it.  We recommend signing up for the patient portal called "MyChart".  Sign up information is provided on this After Visit Summary.  MyChart is used to connect with patients for Virtual Visits (Telemedicine).  Patients are able to view lab/test results, encounter notes, upcoming appointments, etc.  Non-urgent messages can be sent to your provider as well.   To learn more about what you can do with MyChart, go to [email or web address].    Your next appointment:   1 year(s)  The format for your next appointment:   In Person  Provider:   Tessa Lerner, DO {

## 2023-07-30 NOTE — Progress Notes (Signed)
 Cardiology Office Note:  .   Date:  07/30/2023  ID:  [name] [name], DOB [birth year], MRN [id number] PCP:  Sigmund Hazel, MD  Former Cardiology Providers: None [facility] Providers Cardiologist:  Tessa Lerner, DO , [facility] (established care 01/04/2022 ) Electrophysiologist:  None  Click to update primary MD,subspecialty MD or APP then REFRESH:1}    Chief Complaint  Patient presents with   Shortness of Breath   PFO closure   Follow-up    6 months    History of Present Illness: [name] [name]   [name] [name] is a 73 y.o. Caucasian female whose past medical history and cardiovascular risk factors includes: Hyperlipidemia, obesity, obstructive sleep apnea, history of lower GI bleed, iron deficiency anemia, history of TIA and prior strokes, Patent PFO s/p 16 mm Amplatzer septal occlude, history of breast cancer status post right mastectomy, aortic atherosclerosis (CT scan 10/2021), a large hiatal hernia.   In June 2023 she was diagnosed with TIA and during the hospitalization stroke workup revealed prior strokes per imaging studies as well.  She underwent transesophageal echocardiogram which noted PFO and she eventually underwent PFO closure in November 2023.   In the past that she has had dyspnea for multiple reasons-history of acute blood loss anemia, eosinophilia, groundglass opacity, etc.  Clinically she is doing well from a cardiovascular standpoint.  No anginal chest pain.  Shortness of breath remains chronic and stable mostly pronounced with effort related activity/overexertion.  But overall intensity frequency and duration remains stable.  She has tired conservative management with regards to weight loss but now has finally decided to start [facility].  She started last week.  Review of Systems: .   Review of Systems  Constitutional: Negative for malaise/fatigue.  Cardiovascular:  Positive for dyspnea on exertion. Negative for chest pain, claudication, irregular heartbeat, leg swelling,  near-syncope, orthopnea, palpitations, paroxysmal nocturnal dyspnea and syncope.  Respiratory:  Positive for shortness of breath. Negative for cough, sleep disturbances due to breathing, sputum production and wheezing.   Hematologic/Lymphatic: Negative for bleeding problem.  Musculoskeletal:  Negative for muscle cramps and myalgias.  Neurological:  Negative for dizziness and light-headedness.    Studies Reviewed:   EKG: EKG Interpretation Date/Time:  Monday July 30 2023 13:17:43 EDT Ventricular Rate:  76 PR Interval:  150 QRS Duration:  74 QT Interval:  366 QTC Calculation: 411 R Axis:   -25  Text Interpretation: Normal sinus rhythm Consider Inferior infarct , age undetermined Cannot rule out Anterior infarct , age undetermined When compared with ECG of 30-May-2022 11:34, No significant change since last tracing Confirmed by Tessa Lerner [id number]) on 07/30/2023 1:31:42 PM  Echocardiogram: 05/17/2022: LVEF 55-60%, no regional wall motion abnormalities, indeterminate diastolic function, right ventricular size and function normal, left atrium mildly dilated, 16 mm Amplatzer device across the atrial septum (possible left to right flow), trivial MR, RAP estimated at 3 mmHg  Stress Testing: Exercise Tetrofosmin stress test 01/24/2022: Low risk study  Atrial septal defect closure 04/03/2022: Successful closure of the atrial septal defect using a 16 mm Amplatzer septal occluder, although the defect was PFO, had secundum ASD anatomy and very complex septum with severe aneurysmal motion.   Patient will need antibiotic prophylaxis for 6 months.  She will be on aspirin indefinitely and Plavix for 90 days.  RADIOLOGY: CT angio GI bleed protocol: 10/25/2021: 1. No evidence of acute abnormalities within the abdomen or pelvis. 2. Diffuse colonic diverticulosis without evidence of acute diverticulitis. 3. Large hiatal hernia. Aortic Atherosclerosis (ICD10-I70.0).  CT of the head without  contrast: 11/15/2021 1. No evidence of acute intracranial abnormality. 2. Small remote appearing right cerebellar lacunar infarct. 3. Moderate paranasal sinus mucosal thickening.   MRI brain without contrast: 11/15/2021 1. Subtle 7 mm acute infarct versus artifact within the posterior right frontal lobe subcortical white matter. 2. Background mild cerebral white matter chronic small vessel ischemic disease. 3. Chronic lacunar infarcts within the posterior right lentiform nucleus and medial left thalamus. 4. Subcentimeter chronic infarcts within the bilateral cerebellar hemispheres. 5. Paranasal sinus disease, as described.  Risk Assessment/Calculations:   The 10-year ASCVD risk score (Arnett DK, et al., 2019) is: 13.3%   Values used to calculate the score:     Age: 78 years     Sex: Female     Is Non-Hispanic African American: No     Diabetic: No     Tobacco smoker: No     Systolic Blood Pressure: 124 mmHg     Is BP treated: No     HDL Cholesterol: 49 mg/dL     Total Cholesterol: 158 mg/dL  Labs:       Latest Ref Rng & Units 06/02/2022    5:43 AM 06/01/2022    3:36 PM 06/01/2022    7:12 AM  CBC  WBC 4.0 - 10.5 K/uL 7.5   6.4   Hemoglobin 12.0 - 15.0 g/dL 7.7  7.7  7.7   Hematocrit 36.0 - 46.0 % 24.4  25.1  25.1   Platelets 150 - 400 K/uL 290   294        Latest Ref Rng & Units 06/01/2022    7:12 AM 05/31/2022    6:17 AM 05/30/2022   11:18 AM  BMP  Glucose 70 - 99 mg/dL 161  93  096   BUN 8 - 23 mg/dL 6  16  32   Creatinine 0.44 - 1.00 mg/dL 0.45  4.09  8.11   Sodium 135 - 145 mmol/L 140  138  137   Potassium 3.5 - 5.1 mmol/L 3.2  3.8  3.3   Chloride 98 - 111 mmol/L 113  111  107   CO2 22 - 32 mmol/L 21  21  21    Calcium 8.9 - 10.3 mg/dL 7.9  7.9  8.7       Latest Ref Rng & Units 06/01/2022    7:12 AM 05/31/2022    6:17 AM 05/30/2022   11:18 AM  CMP  Glucose 70 - 99 mg/dL 914  93  782   BUN 8 - 23 mg/dL 6  16  32   Creatinine 0.44 - 1.00 mg/dL 9.56  2.13  0.86    Sodium 135 - 145 mmol/L 140  138  137   Potassium 3.5 - 5.1 mmol/L 3.2  3.8  3.3   Chloride 98 - 111 mmol/L 113  111  107   CO2 22 - 32 mmol/L 21  21  21    Calcium 8.9 - 10.3 mg/dL 7.9  7.9  8.7   Total Protein 6.5 - 8.1 g/dL  5.6  6.7   Total Bilirubin 0.3 - 1.2 mg/dL  0.8  0.9   Alkaline Phos 38 - 126 U/L  49  60   AST 15 - 41 U/L  19  24   ALT 0 - 44 U/L  27  31     Lab Results  Component Value Date   CHOL 158 11/15/2021   HDL 49 11/15/2021   LDLCALC 80 11/15/2021  TRIG 143 11/15/2021   CHOLHDL 3.2 11/15/2021   No results for input(s): "LIPOA" in the last 8760 hours. No components found for: "NTPROBNP" No results for input(s): "PROBNP" in the last 8760 hours. No results for input(s): "TSH" in the last 8760 hours.    Physical Exam:    Today's Vitals   07/30/23 1315  BP: 124/76  Pulse: 89  Resp: 16  SpO2: 95%  Weight: 191 lb (86.6 kg)  Height: 5\' 4"  (1.626 m)   Body mass index is 32.79 kg/m. Wt Readings from Last 3 Encounters:  07/30/23 191 lb (86.6 kg)  01/25/23 189 lb (85.7 kg)  09/20/22 190 lb (86.2 kg)    Physical Exam  Constitutional: No distress.  Age appropriate, hemodynamically stable.   Neck: No JVD present.  Cardiovascular: Normal rate, regular rhythm, S1 normal, S2 normal, intact distal pulses and normal pulses. Exam reveals no gallop, no S3 and no S4.  No murmur heard. Pulses:      Dorsalis pedis pulses are 2+ on the right side and 2+ on the left side.       Posterior tibial pulses are 2+ on the right side and 2+ on the left side.  Pulmonary/Chest: Effort normal. No stridor. She has no wheezes. She has no rales.  Abdominal: Soft. Bowel sounds are normal. She exhibits no distension. There is no abdominal tenderness.  Musculoskeletal:        General: No edema.     Cervical back: Neck supple.  Neurological: She is alert and oriented to person, place, and time. She has intact cranial nerves (2-12).  Skin: Skin is warm and moist.      Impression & Recommendation(s):  Impression:   ICD-10-CM   1. Shortness of breath  R06.02     2. History of stroke  Z86.73     3. TIA (transient ischemic attack)  G45.9     4. PFO (patent foramen ovale)  Q21.12 EKG 12-Lead    5. Status post device closure of PFO  Z87.74     6. Mixed hyperlipidemia  E78.2     7. Aortic atherosclerosis (HCC)  I70.0     8. HX: breast cancer  Z85.3        Recommendation(s):  Shortness of breath Dyspnea occurs during physical activities. Previous evaluations have not identified a cardiac cause.  -Weight loss may improve symptoms.  -Continue workup for anemia with PCP.  As this may be contributory as well.   -Further cardiac evaluation could be considered if dyspnea persists after significant weight loss.  History of stroke TIA (transient ischemic attack) Has undergone appropriate cardiovascular workup in the past including cardiac monitors which have not illustrated the presence of atrial fibrillation. Patient also does not complain of irregular heart rate or fluttering in the chest in the last office visit. Reemphasized the importance of secondary prevention with focus on improving her modifiable cardiovascular risk factors such as glycemic control, lipid management, blood pressure control, weight loss. Educated importance of secondary prevention BP goal <130/90  LDL goal <55  Glycemic control  PFO (patent foramen ovale) Status post device closure of PFO Status post 16mm Amplatzer septal occluder. Has done well status post PFO closure. But no significant change in her dyspnea.  Mixed hyperlipidemia Currently on Lipitor 40 mg p.o. daily.   She denies myalgia or other side effects. No recent labs available for review.   Advised to have them rechecked after being on [facility] for several months to reevaluate therapy.  Given her history of TIA, CVA, and her current 10-year risk of ASCVD as mentioned above recommended goal LDL <55  mg/dL Cardiology is following peripherally.  Aortic atherosclerosis (HCC) Continue antiplatelet therapy with aspirin 81 mg p.o. daily and statin therapy.  Orders Placed:  Orders Placed This Encounter  Procedures   EKG 12-Lead   Final Medication List:   No orders of the defined types were placed in this encounter.   Medications Discontinued During This Encounter  Medication Reason   psyllium (HYDROCIL/METAMUCIL) 95 % PACK Change in therapy     Current Outpatient Medications:    aspirin EC 81 MG tablet, Take 1 tablet (81 mg total) by mouth daily. Swallow whole., Disp: 30 tablet, Rfl: 12   atorvastatin (LIPITOR) 40 MG tablet, Take 1 tablet (40 mg total) by mouth at bedtime., Disp: 30 tablet, Rfl: 11   Budeson-Glycopyrrol-Formoterol (BREZTRI AEROSPHERE) 160-9-4.8 MCG/ACT AERO, Inhale 2 puffs into the lungs in the morning and at bedtime., Disp: 5.9 g, Rfl: 0   escitalopram (LEXAPRO) 10 MG tablet, Take 10 mg by mouth daily., Disp: , Rfl:    esomeprazole (NEXIUM) 10 MG packet, Take 10 mg by mouth daily before breakfast., Disp: , Rfl:    ferrous sulfate 325 (65 FE) MG tablet, Take 325 mg by mouth every Monday, Wednesday, and Friday., Disp: , Rfl:    fluticasone (FLONASE) 50 MCG/ACT nasal spray, Place 1 spray into both nostrils daily as needed for allergies or rhinitis., Disp: , Rfl:    folic acid (FOLVITE) 1 MG tablet, Take 1 mg by mouth daily., Disp: , Rfl:    melatonin 3 MG TABS tablet, Take 1 tablet (3 mg total) by mouth at bedtime. Also available over-the-counter, Disp: 30 tablet, Rfl: 3   OVER THE COUNTER MEDICATION, Take 1 capsule by mouth daily. Andrew Lessman's Ultimate Eye Support with Astaxanthin capsules- Take 1 capsule by mouth every morning, Disp: , Rfl:    OVER THE COUNTER MEDICATION, Take 1 capsule by mouth daily. [employer]urc[employer] Pea Essential-1 with Vitamin D3-2000 capsules- Take 1 capsule by mouth once a day after breakfast, Disp: , Rfl:    polyethylene glycol (MIRALAX /  GLYCOLAX) 17 g packet, Take 17 g by mouth daily. (Patient taking differently: Take 17 g by mouth daily as needed for moderate constipation (with iron supplement).), Disp: 14 each, Rfl: 0   psyllium (REGULOID) 0.52 g capsule, Take 0.52 g by mouth daily., Disp: , Rfl:    Vitamin D, Ergocalciferol, (DRISDOL) 1.25 MG (50000 UNIT) CAPS capsule, Take 50,000 Units by mouth See admin instructions. Take 50,000 units by mouth every Wednesday and Saturday, Disp: , Rfl:    WEGOVY 0.25 MG/0.5ML SOAJ, Inject 0.25 mg into the skin., Disp: , Rfl:   Consent:   NA  Disposition:   12 months follow-up sooner if needed Patient may be asked to follow-up sooner based on the results of the above-mentioned testing.  Her questions and concerns were addressed to her satisfaction. She voices understanding of the recommendations provided during this encounter.    Signed, Tessa Lerner, DO, [facility]  [facility]  [address] [place], [place] [address] 07/30/2023 11:58 PM

## 2023-08-14 ENCOUNTER — Other Ambulatory Visit: Payer: Self-pay | Admitting: Cardiology

## 2023-08-14 DIAGNOSIS — Z8774 Personal history of (corrected) congenital malformations of heart and circulatory system: Secondary | ICD-10-CM

## 2023-08-14 DIAGNOSIS — Q2112 Patent foramen ovale: Secondary | ICD-10-CM

## 2023-08-14 DIAGNOSIS — G4733 Obstructive sleep apnea (adult) (pediatric): Secondary | ICD-10-CM | POA: Diagnosis not present

## 2023-08-14 DIAGNOSIS — Z8673 Personal history of transient ischemic attack (TIA), and cerebral infarction without residual deficits: Secondary | ICD-10-CM

## 2023-08-14 DIAGNOSIS — G459 Transient cerebral ischemic attack, unspecified: Secondary | ICD-10-CM

## 2023-08-23 DIAGNOSIS — R7303 Prediabetes: Secondary | ICD-10-CM | POA: Diagnosis not present

## 2023-08-23 DIAGNOSIS — F411 Generalized anxiety disorder: Secondary | ICD-10-CM | POA: Diagnosis not present

## 2023-08-23 DIAGNOSIS — J8283 Eosinophilic asthma: Secondary | ICD-10-CM | POA: Diagnosis not present

## 2023-08-23 DIAGNOSIS — F5081 Binge eating disorder, mild: Secondary | ICD-10-CM | POA: Diagnosis not present

## 2023-08-23 DIAGNOSIS — G4733 Obstructive sleep apnea (adult) (pediatric): Secondary | ICD-10-CM | POA: Diagnosis not present

## 2023-08-23 DIAGNOSIS — E6609 Other obesity due to excess calories: Secondary | ICD-10-CM | POA: Diagnosis not present

## 2023-08-23 DIAGNOSIS — Z6831 Body mass index (BMI) 31.0-31.9, adult: Secondary | ICD-10-CM | POA: Diagnosis not present

## 2023-08-23 DIAGNOSIS — E782 Mixed hyperlipidemia: Secondary | ICD-10-CM | POA: Diagnosis not present

## 2023-08-23 DIAGNOSIS — K219 Gastro-esophageal reflux disease without esophagitis: Secondary | ICD-10-CM | POA: Diagnosis not present

## 2023-09-19 NOTE — Progress Notes (Signed)
 [facility] [address] [place]. [place] [address] [phone]       OFFICE FOLLOW UP NOTE  Ms. [name] [name] Date of Birth:  1950-11-23 Medical Record Number:  [id number]    Reason for visit: CPAP follow-up    SUBJECTIVE:   CHIEF COMPLAINT:  Chief Complaint  Patient presents with   Sleep Apnea    Rm 8 alone  Pt is well and stable. Reports no concerns with OSA/CPAP    Follow-up visit:  Prior visit: 09/20/2022 with Dr. Omar Bibber  Brief HPI:   [name] [name] is a 73 y.o. female who is followed for OSA on CPAP.  PMH of history of TIA and stroke., s/p PFO closure 03/2022, diverticulosis, lower GI bleed, macular degeneration, reflux disease, hyperlipidemia, breast cancer with status post mastectomy, and mildly overweight state. First seen by Dr. Omar Bibber 12/2021 and noted prior dx of sleep apnea not currently on PAP therapy.  Baseline sleep study through our sleep lab on 02/14/2022 which showed a total AHI of 13.5/h, REM AHI of 40.5/h, supine AHI of 22.8/h, O2 nadir 80%.  She was advised to proceed with home AutoPap therapy.  Her set up date was 06/27/2022.      Interval history:  Returns for CPAP follow-up visit.  She continues to do well with CPAP therapy.  She notes continued benefit with improvement of sleep quality, nocturia and snoring.  ESS 3/24.  Some mornings still wakes up feeling unrefreshed.  She has been struggling some with allergies, uses fluticasone  and Claritin. She started [facility] in March.  History of TIA, no new stroke/TIA symptoms, remains on aspirin  and atorvastatin  and routinely follows with PCP for stroke risk factor management.        ROS:   14 system review of systems performed and negative with exception of those listed in HPI  PMH:  Past Medical History:  Diagnosis Date   Acute lower GI bleeding 10/25/2021   Aortic atherosclerosis (HCC) 10/25/2021   Cancer (HCC)    Diverticulosis of intestine with bleeding 10/25/2021   GERD  (gastroesophageal reflux disease) 10/25/2021   Hyperlipidemia 10/25/2021   Macular degeneration    PONV (postoperative nausea and vomiting)     PSH:  Past Surgical History:  Procedure Laterality Date   ABDOMINAL HYSTERECTOMY     BREAST EXCISIONAL BIOPSY Right    BREAST LUMPECTOMY WITH RADIOACTIVE SEED LOCALIZATION Right 01/18/2016   Procedure: RIGHT BREAST LUMPECTOMY WITH RADIOACTIVE SEED LOCALIZATION;  Surgeon: Sim Dryer, MD;  Location: [facility];  Service: General;  Laterality: Right;   BREAST SURGERY     BUBBLE STUDY  03/01/2022   Procedure: BUBBLE STUDY;  Surgeon: Olinda Bertrand, DO;  Location: [facility];  Service: Cardiovascular;;   MASTECTOMY Left    PATENT FORAMEN OVALE(PFO) CLOSURE N/A 04/07/2022   Procedure: PATENT FORAMEN OVALE(PFO) CLOSURE;  Surgeon: Knox Perl, MD;  Location: [facility];  Service: Cardiovascular;  Laterality: N/A;   TEE WITHOUT CARDIOVERSION N/A 03/01/2022   Procedure: TRANSESOPHAGEAL ECHOCARDIOGRAM (TEE);  Surgeon: Olinda Bertrand, DO;  Location: [facility];  Service: Cardiovascular;  Laterality: N/A;   TURBINATE REDUCTION      Social History:  Social History   Socioeconomic History   Marital status: Widowed    Spouse name: Not on file   Number of children: 1   Years of education: Not on file   Highest education level: Not on file  Occupational History   Not on file  Tobacco Use   Smoking  status: Never    Passive exposure: Never   Smokeless tobacco: Never  Vaping Use   Vaping status: Never Used  Substance and Sexual Activity   Alcohol use: No   Drug use: No   Sexual activity: Not on file  Other Topics Concern   Not on file  Social History Narrative   Caffeine: soda 2-3 cups/day mid-day   Social Drivers of Corporate investment banker Strain: Not on file  Food Insecurity: No Food Insecurity (05/30/2022)   Hunger Vital Sign    Worried About Running Out of Food in the Last Year: Never true    Ran Out of Food in  the Last Year: Never true  Transportation Needs: No Transportation Needs (05/30/2022)   PRAPARE - Administrator, Civil Service (Medical): No    Lack of Transportation (Non-Medical): No  Physical Activity: Not on file  Stress: Not on file  Social Connections: Not on file  Intimate Partner Violence: Not At Risk (05/30/2022)   Humiliation, Afraid, Rape, and Kick questionnaire    Fear of Current or Ex-Partner: No    Emotionally Abused: No    Physically Abused: No    Sexually Abused: No    Family History:  Family History  Problem Relation Age of Onset   Stomach cancer Father    Dementia Paternal Grandmother    Stroke Paternal Grandfather    BRCA 1/2 Neg Hx    Breast cancer Neg Hx    Sleep apnea Neg Hx     Medications:   Current Outpatient Medications on File Prior to Visit  Medication Sig Dispense Refill   aspirin  EC (ASPIRIN  LOW DOSE) 81 MG tablet Take 1 tablet (81 mg total) by mouth daily. SWALLOW WHOLE. 90 tablet 3   atorvastatin  (LIPITOR) 40 MG tablet Take 1 tablet (40 mg total) by mouth at bedtime. 30 tablet 11   Budeson-Glycopyrrol-Formoterol  (BREZTRI  AEROSPHERE) 160-9-4.8 MCG/ACT AERO Inhale 2 puffs into the lungs in the morning and at bedtime. 5.9 g 0   escitalopram (LEXAPRO) 10 MG tablet Take 10 mg by mouth daily.     esomeprazole (NEXIUM) 10 MG packet Take 10 mg by mouth daily before breakfast.     ferrous sulfate  325 (65 FE) MG tablet Take 325 mg by mouth every Monday, Wednesday, and Friday.     fluticasone  (FLONASE ) 50 MCG/ACT nasal spray Place 1 spray into both nostrils daily as needed for allergies or rhinitis.     folic acid  (FOLVITE ) 1 MG tablet Take 1 mg by mouth daily.     melatonin 3 MG TABS tablet Take 1 tablet (3 mg total) by mouth at bedtime. Also available over-the-counter 30 tablet 3   OVER THE COUNTER MEDICATION Take 1 capsule by mouth daily. Andrew Lessman's Ultimate Eye Support with Astaxanthin capsules- Take 1 capsule by mouth every morning      OVER THE COUNTER MEDICATION Take 1 capsule by mouth daily. [employer] Essential-1 with Vitamin D3-2000 capsules- Take 1 capsule by mouth once a day after breakfast     polyethylene glycol (MIRALAX  / GLYCOLAX ) 17 g packet Take 17 g by mouth daily. (Patient taking differently: Take 17 g by mouth daily as needed for moderate constipation (with iron supplement).) 14 each 0   psyllium (REGULOID) 0.52 g capsule Take 0.52 g by mouth daily.     Vitamin D, Ergocalciferol, (DRISDOL) 1.25 MG (50000 UNIT) CAPS capsule Take 50,000 Units by mouth See admin instructions. Take 50,000 units by mouth every Wednesday  and Saturday     WEGOVY 0.25 MG/0.5ML SOAJ Inject 0.25 mg into the skin.     No current facility-administered medications on file prior to visit.    Allergies:   Allergies  Allergen Reactions   Hydroxyzine  Anxiety      OBJECTIVE:  Physical Exam  Vitals:   09/20/23 1354  BP: 126/72  Pulse: 76  Weight: 186 lb (84.4 kg)  Height: 5\' 4"  (1.626 m)   Body mass index is 31.93 kg/m. No results found.   General: well developed, well nourished, very pleasant elderly Caucasian female, seated, in no evident distress Head: head normocephalic and atraumatic.   Neck: supple with no carotid or supraclavicular bruits Cardiovascular: regular rate and rhythm, no murmurs Musculoskeletal: no deformity Skin:  no rash/petichiae Vascular:  Normal pulses all extremities   Neurologic Exam Mental Status: Awake and fully alert. Oriented to place and time. Recent and remote memory intact. Attention span, concentration and fund of knowledge appropriate. Mood and affect appropriate.  Cranial Nerves: Pupils equal, briskly reactive to light. Extraocular movements full without nystagmus. Visual fields full to confrontation. Hearing intact. Facial sensation intact. Face, tongue, palate moves normally and symmetrically.  Motor: Normal bulk and tone. Normal strength in all tested extremity muscles Gait and  Station: Arises from chair without difficulty. Stance is normal. Gait demonstrates normal stride length and balance without use of AD.        ASSESSMENT/PLAN: SHERRLYN BUCKLES is a 73 y.o. year old female    OSA on CPAP :  Compliance report shows satisfactory usage with residual AHI of 5.1.   Does report occasionally waking feeling unrefreshed, will slightly adjust pressure setting from 5-11 to 5-12.   Discussed continued use of allergy medications as this may also be contributing.  Discussed continued nightly usage with ensuring greater than 4 hours nightly for optimal benefit and per insurance purposes.  Continue to follow with DME company for any needed supplies or CPAP related concerns  Hx of TIA: Discussed ongoing use of aspirin  and atorvastatin  as well as close follow-up with PCP for stroke risk factor management      Follow up in 1 year via MyChart video visit or call earlier if needed   CC:  PCP: Perley Bradley, MD    I spent 25 minutes of face-to-face and non-face-to-face time with patient.  This included previsit chart review, lab review, study review, order entry, electronic health record documentation, patient education and discussion regarding above diagnoses and treatment plan and answered all other questions to patient's satisfaction  Johny Nap, [facility]  [facility] [address] [place], [place] [address]  Phone [phone] Fax [phone] Note: This document was prepared with digital dictation and possible smart phrase technology. Any transcriptional errors that result from this process are unintentional.

## 2023-09-20 ENCOUNTER — Encounter: Payer: Self-pay | Admitting: Adult Health

## 2023-09-20 ENCOUNTER — Ambulatory Visit: Payer: Medicare PPO | Admitting: Adult Health

## 2023-09-20 VITALS — BP 126/72 | HR 76 | Ht 64.0 in | Wt 186.0 lb

## 2023-09-20 DIAGNOSIS — Z6831 Body mass index (BMI) 31.0-31.9, adult: Secondary | ICD-10-CM | POA: Diagnosis not present

## 2023-09-20 DIAGNOSIS — Z8673 Personal history of transient ischemic attack (TIA), and cerebral infarction without residual deficits: Secondary | ICD-10-CM | POA: Diagnosis not present

## 2023-09-20 DIAGNOSIS — G4733 Obstructive sleep apnea (adult) (pediatric): Secondary | ICD-10-CM

## 2023-09-20 DIAGNOSIS — E6609 Other obesity due to excess calories: Secondary | ICD-10-CM | POA: Diagnosis not present

## 2023-09-20 DIAGNOSIS — J8283 Eosinophilic asthma: Secondary | ICD-10-CM | POA: Diagnosis not present

## 2023-09-20 DIAGNOSIS — R7303 Prediabetes: Secondary | ICD-10-CM | POA: Diagnosis not present

## 2023-09-20 DIAGNOSIS — F5081 Binge eating disorder, mild: Secondary | ICD-10-CM | POA: Diagnosis not present

## 2023-09-20 DIAGNOSIS — K219 Gastro-esophageal reflux disease without esophagitis: Secondary | ICD-10-CM | POA: Diagnosis not present

## 2023-09-20 DIAGNOSIS — E782 Mixed hyperlipidemia: Secondary | ICD-10-CM | POA: Diagnosis not present

## 2023-09-20 DIAGNOSIS — F411 Generalized anxiety disorder: Secondary | ICD-10-CM | POA: Diagnosis not present

## 2023-09-20 NOTE — Patient Instructions (Signed)
 Your Plan:  Continue nightly use of CPAP with greater than 4 hours per night for optimal benefit and per insurance requirements  Continue to follow with your DME company [employer] for any needs supplies or CPAP related concerns  Continue close PCP follow-up for stroke risk factor management     Follow-up in 1 year or call earlier if needed     Thank you for coming to see us  at [facility]. I hope we have been able to provide you high quality care today.  You may receive a patient satisfaction survey over the next few weeks. We would appreciate your feedback and comments so that we may continue to improve ourselves and the health of our patients.

## 2023-10-22 DIAGNOSIS — K219 Gastro-esophageal reflux disease without esophagitis: Secondary | ICD-10-CM | POA: Diagnosis not present

## 2023-10-22 DIAGNOSIS — Z6831 Body mass index (BMI) 31.0-31.9, adult: Secondary | ICD-10-CM | POA: Diagnosis not present

## 2023-10-22 DIAGNOSIS — G4733 Obstructive sleep apnea (adult) (pediatric): Secondary | ICD-10-CM | POA: Diagnosis not present

## 2023-10-22 DIAGNOSIS — J8283 Eosinophilic asthma: Secondary | ICD-10-CM | POA: Diagnosis not present

## 2023-10-22 DIAGNOSIS — F411 Generalized anxiety disorder: Secondary | ICD-10-CM | POA: Diagnosis not present

## 2023-10-22 DIAGNOSIS — E782 Mixed hyperlipidemia: Secondary | ICD-10-CM | POA: Diagnosis not present

## 2023-10-22 DIAGNOSIS — E6609 Other obesity due to excess calories: Secondary | ICD-10-CM | POA: Diagnosis not present

## 2023-10-22 DIAGNOSIS — R7303 Prediabetes: Secondary | ICD-10-CM | POA: Diagnosis not present

## 2023-11-15 DIAGNOSIS — G4733 Obstructive sleep apnea (adult) (pediatric): Secondary | ICD-10-CM | POA: Diagnosis not present

## 2023-11-20 DIAGNOSIS — Z6831 Body mass index (BMI) 31.0-31.9, adult: Secondary | ICD-10-CM | POA: Diagnosis not present

## 2023-11-20 DIAGNOSIS — G4733 Obstructive sleep apnea (adult) (pediatric): Secondary | ICD-10-CM | POA: Diagnosis not present

## 2023-11-20 DIAGNOSIS — E782 Mixed hyperlipidemia: Secondary | ICD-10-CM | POA: Diagnosis not present

## 2023-11-20 DIAGNOSIS — R7303 Prediabetes: Secondary | ICD-10-CM | POA: Diagnosis not present

## 2023-11-20 DIAGNOSIS — F411 Generalized anxiety disorder: Secondary | ICD-10-CM | POA: Diagnosis not present

## 2023-11-20 DIAGNOSIS — E6609 Other obesity due to excess calories: Secondary | ICD-10-CM | POA: Diagnosis not present

## 2023-11-20 DIAGNOSIS — J8283 Eosinophilic asthma: Secondary | ICD-10-CM | POA: Diagnosis not present

## 2023-11-20 DIAGNOSIS — K219 Gastro-esophageal reflux disease without esophagitis: Secondary | ICD-10-CM | POA: Diagnosis not present

## 2023-11-30 IMAGING — US US BREAST*R* LIMITED INC AXILLA
1 series · 10 of 10 positions shown · non-contrast
Comparison: Previous exams including recent screening mammogram
dated 04/08/2021.
COMPARISON: Previous exams including recent screening mammogram
dated 04/08/2021.

Addendum:
CLINICAL DATA: Patient returns today to evaluate a possible RIGHT
breast mass identified on recent screening mammogram. History of
RIGHT breast papilloma status post surgical excision. History of
LEFT breast cancer status post mastectomy.

EXAM:
DIGITAL DIAGNOSTIC UNILATERAL RIGHT MAMMOGRAM WITH TOMOSYNTHESIS AND
CAD; ULTRASOUND RIGHT BREAST LIMITED
TECHNIQUE: Right digital diagnostic mammography and breast tomosynthesis was
performed. The images were evaluated with computer-aided detection.;
Targeted ultrasound examination of the right breast was performed

[Series 1: us breast*right* limited inc axilla · 0.07mm/px · 10 of 10 slices shown]
[im 1/10]
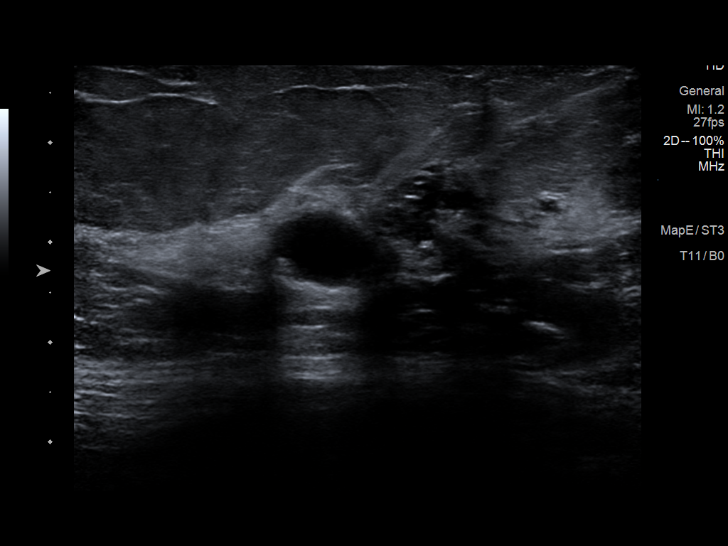
[im 2/10]
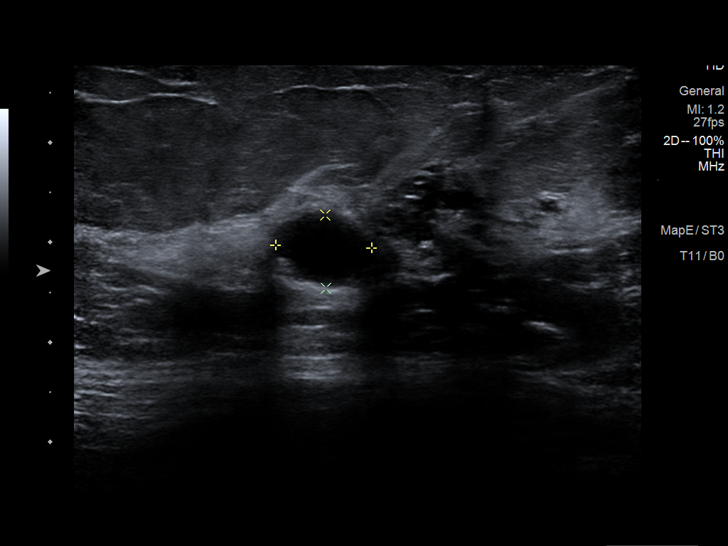
[im 3/10]
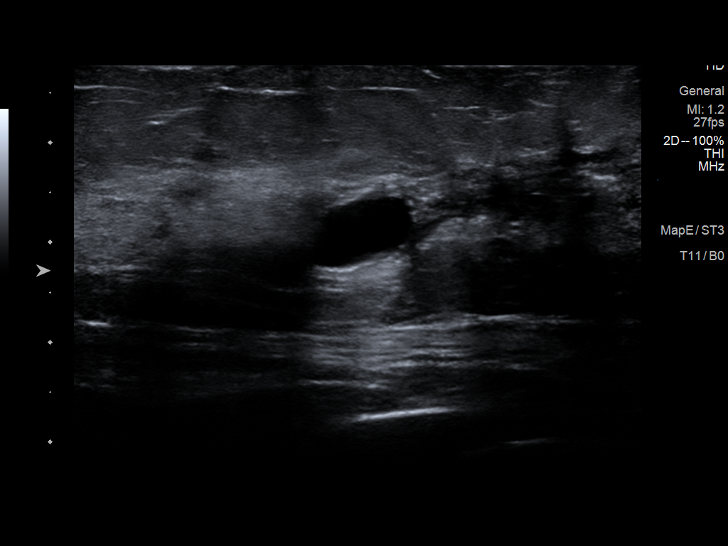
[im 4/10]
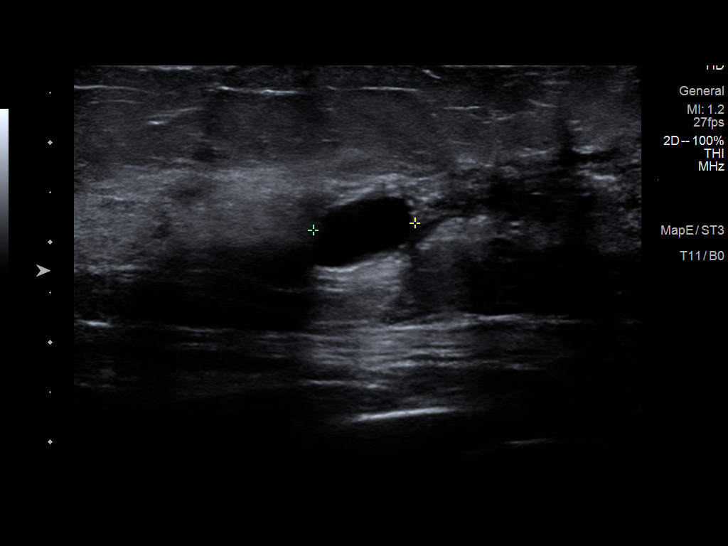
[im 5/10]
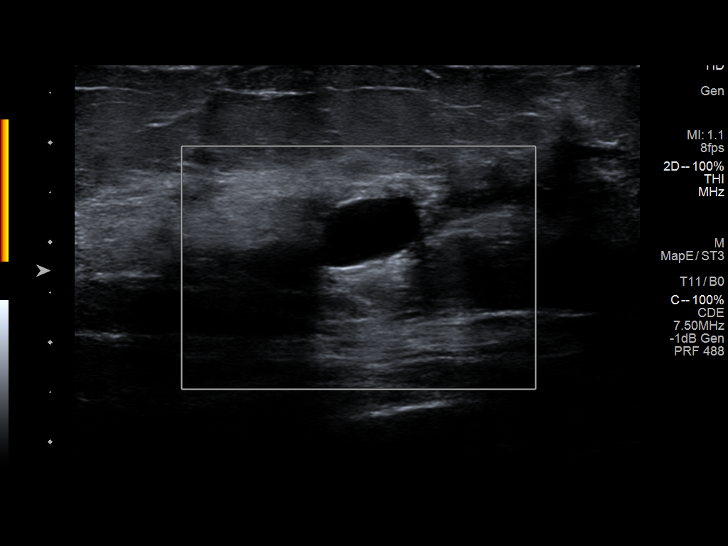
[im 6/10]
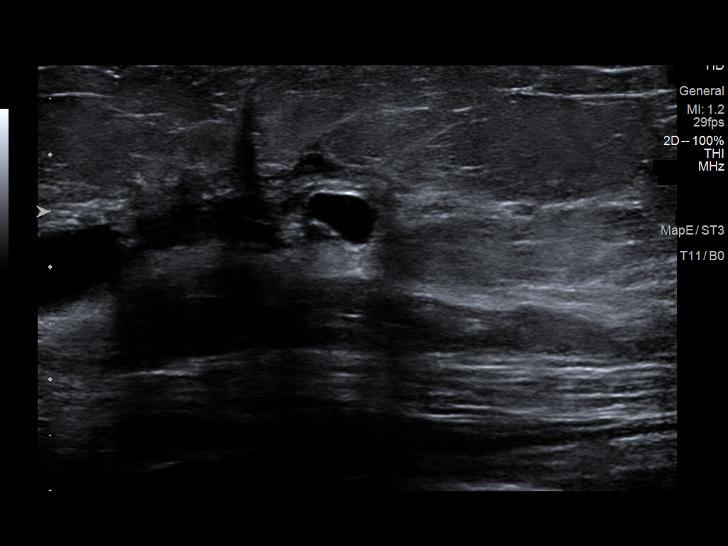
[im 7/10]
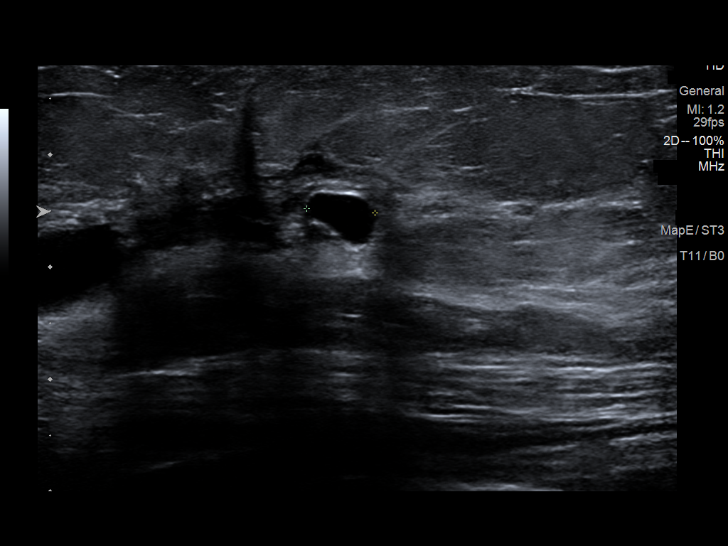
[im 8/10]
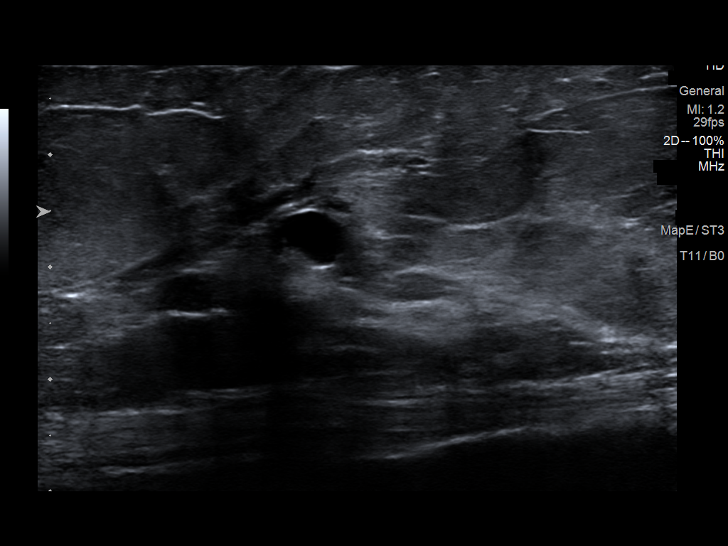
[im 9/10]
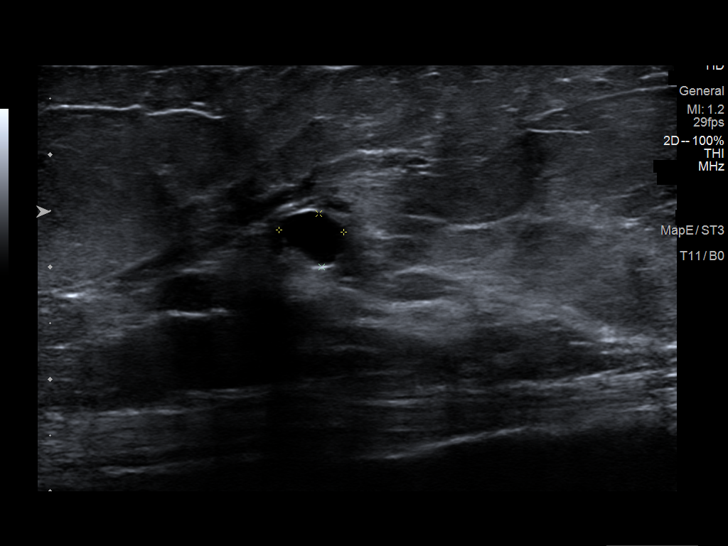
[im 10/10]
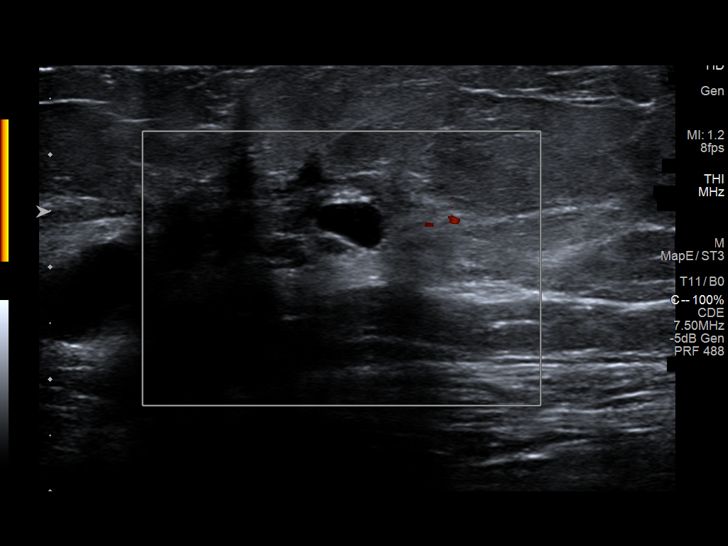

[10 of 10 positions shown; findings below may reference images not displayed]

ACR Breast Density Category b: There are scattered areas of
fibroglandular density.
FINDINGS: There are partially obscured masses within the central RIGHT breast,
largest measuring approximately 1 cm greatest dimension, adjacent to
the site of earlier surgical excision (with surgical clips in
place).

Targeted ultrasound is performed, showing 2 benign cysts in the
retroareolar RIGHT breast, 9 o'clock axis and 2 o'clock axis,
measuring 1 cm and 0.6 cm respectively, corresponding to the
mammographic findings.
IMPRESSION: No evidence of malignancy. Benign cysts in the retroareolar RIGHT
breast, largest measuring 1 cm, corresponding to the mammographic
findings.

Patient may return to routine annual bilateral screening mammogram
schedule.

RECOMMENDATION:
Screening mammogram in one year.(Code:[id number])

I have discussed the findings and recommendations with the patient.
If applicable, a reminder letter will be sent to the patient
regarding the next appointment.

BI-RADS CATEGORY  2: Benign.

ADDENDUM:
Transcription error in report. Recommendation should read as
follows:

Patient may return to routine annual RIGHT breast screening
mammogram schedule.

RIGHT breast screening mammogram in 1 year.

*** End of Addendum ***
ACR Breast Density Category b: There are scattered areas of
fibroglandular density.
FINDINGS: There are partially obscured masses within the central RIGHT breast,
largest measuring approximately 1 cm greatest dimension, adjacent to
the site of earlier surgical excision (with surgical clips in
place).

Targeted ultrasound is performed, showing 2 benign cysts in the
retroareolar RIGHT breast, 9 o'clock axis and 2 o'clock axis,
measuring 1 cm and 0.6 cm respectively, corresponding to the
mammographic findings.
IMPRESSION: No evidence of malignancy. Benign cysts in the retroareolar RIGHT
breast, largest measuring 1 cm, corresponding to the mammographic
findings.

Patient may return to routine annual bilateral screening mammogram
schedule.

RECOMMENDATION:
Screening mammogram in one year.(Code:[id number])

I have discussed the findings and recommendations with the patient.
If applicable, a reminder letter will be sent to the patient
regarding the next appointment.

BI-RADS CATEGORY  2: Benign.

## 2023-11-30 IMAGING — MG MM DIGITAL DIAGNOSTIC UNILAT*R* W/ TOMO W/ CAD
4 series · 4 of 12 positions shown · non-contrast
Comparison: Previous exams including recent screening mammogram
dated 04/08/2021.
COMPARISON: Previous exams including recent screening mammogram
dated 04/08/2021.

Addendum:
CLINICAL DATA: Patient returns today to evaluate a possible RIGHT
breast mass identified on recent screening mammogram. History of
RIGHT breast papilloma status post surgical excision. History of
LEFT breast cancer status post mastectomy.

EXAM:
DIGITAL DIAGNOSTIC UNILATERAL RIGHT MAMMOGRAM WITH TOMOSYNTHESIS AND
CAD; ULTRASOUND RIGHT BREAST LIMITED
TECHNIQUE: Right digital diagnostic mammography and breast tomosynthesis was
performed. The images were evaluated with computer-aided detection.;
Targeted ultrasound examination of the right breast was performed

[R CC synth-2D]
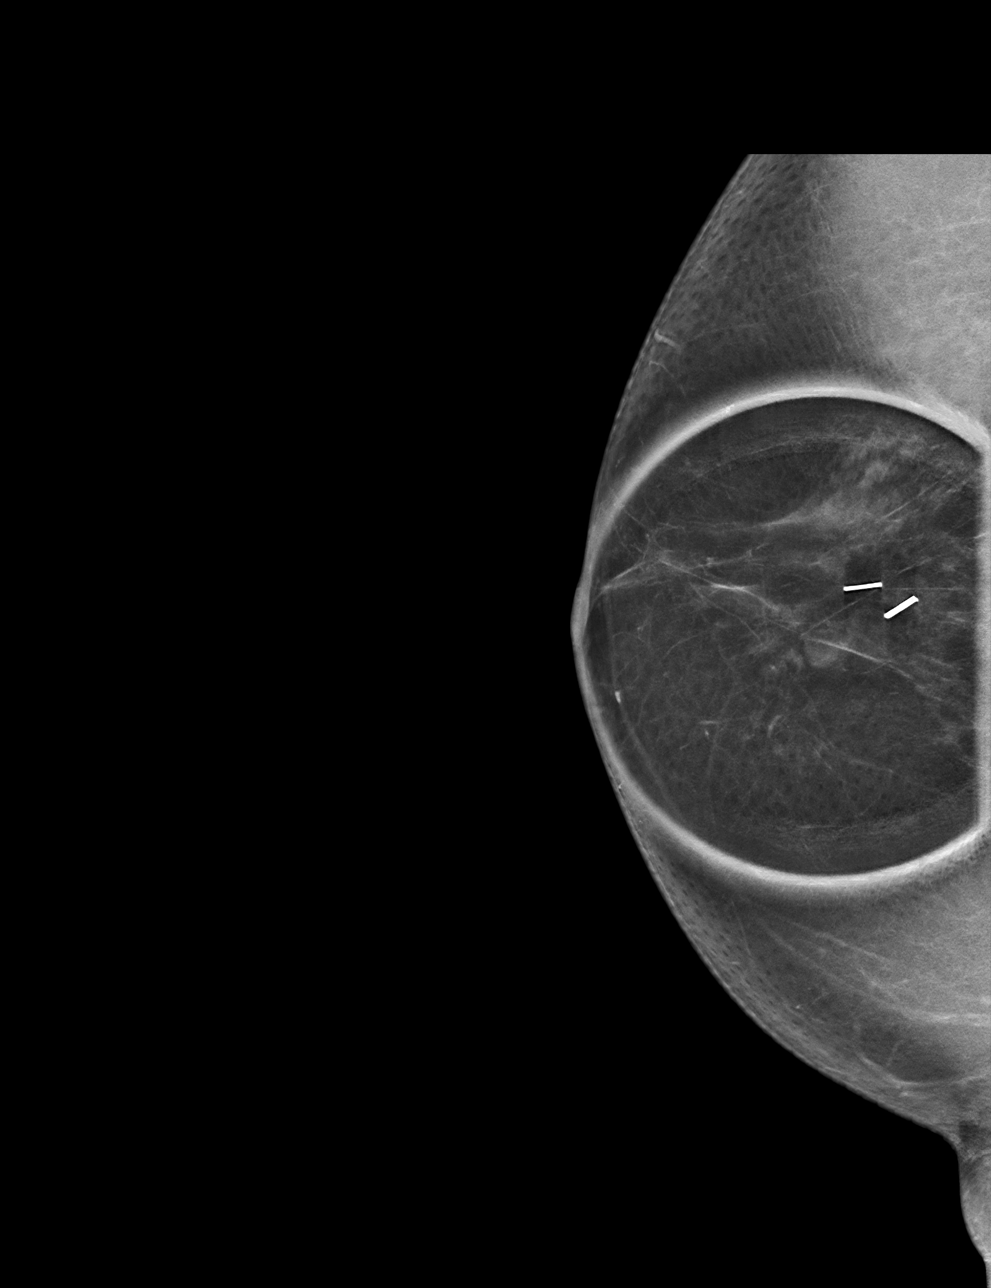

[R MLO synth-2D]
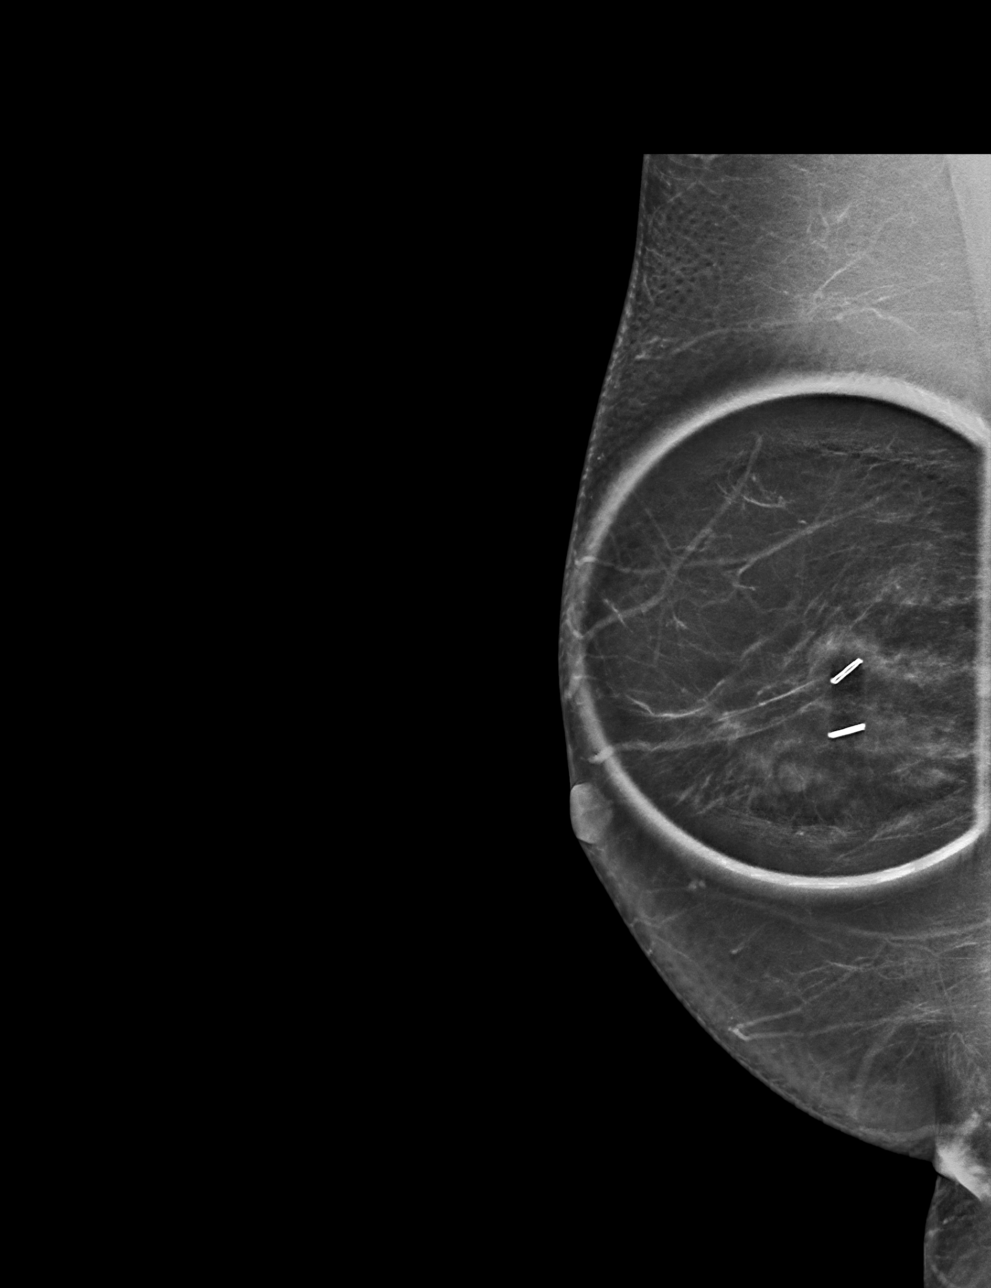

[R CC tomo · tomo slice 33/64.0]
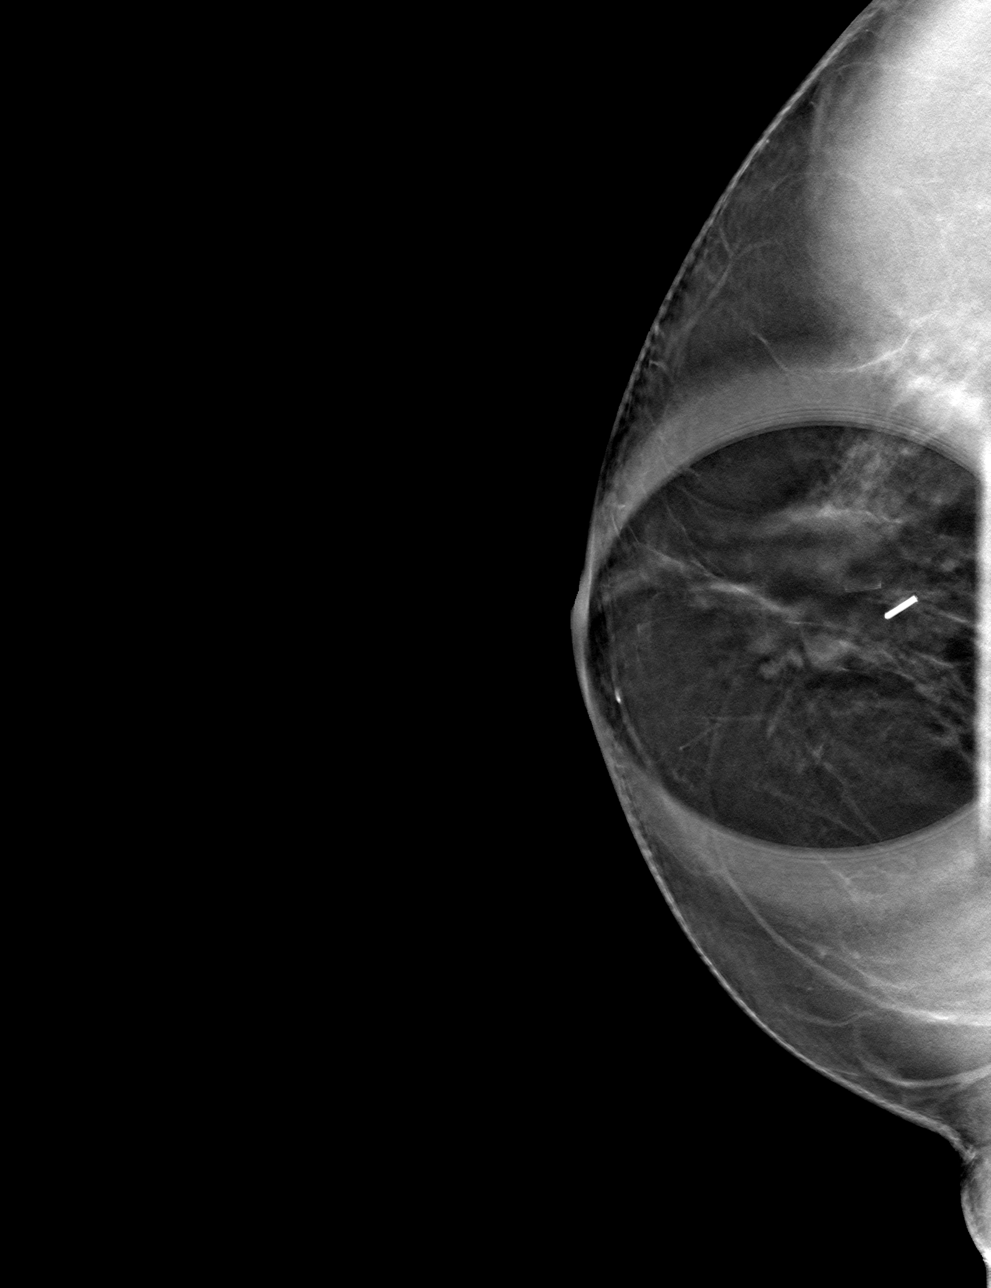

[R MLO tomo · tomo slice 35/69.0]
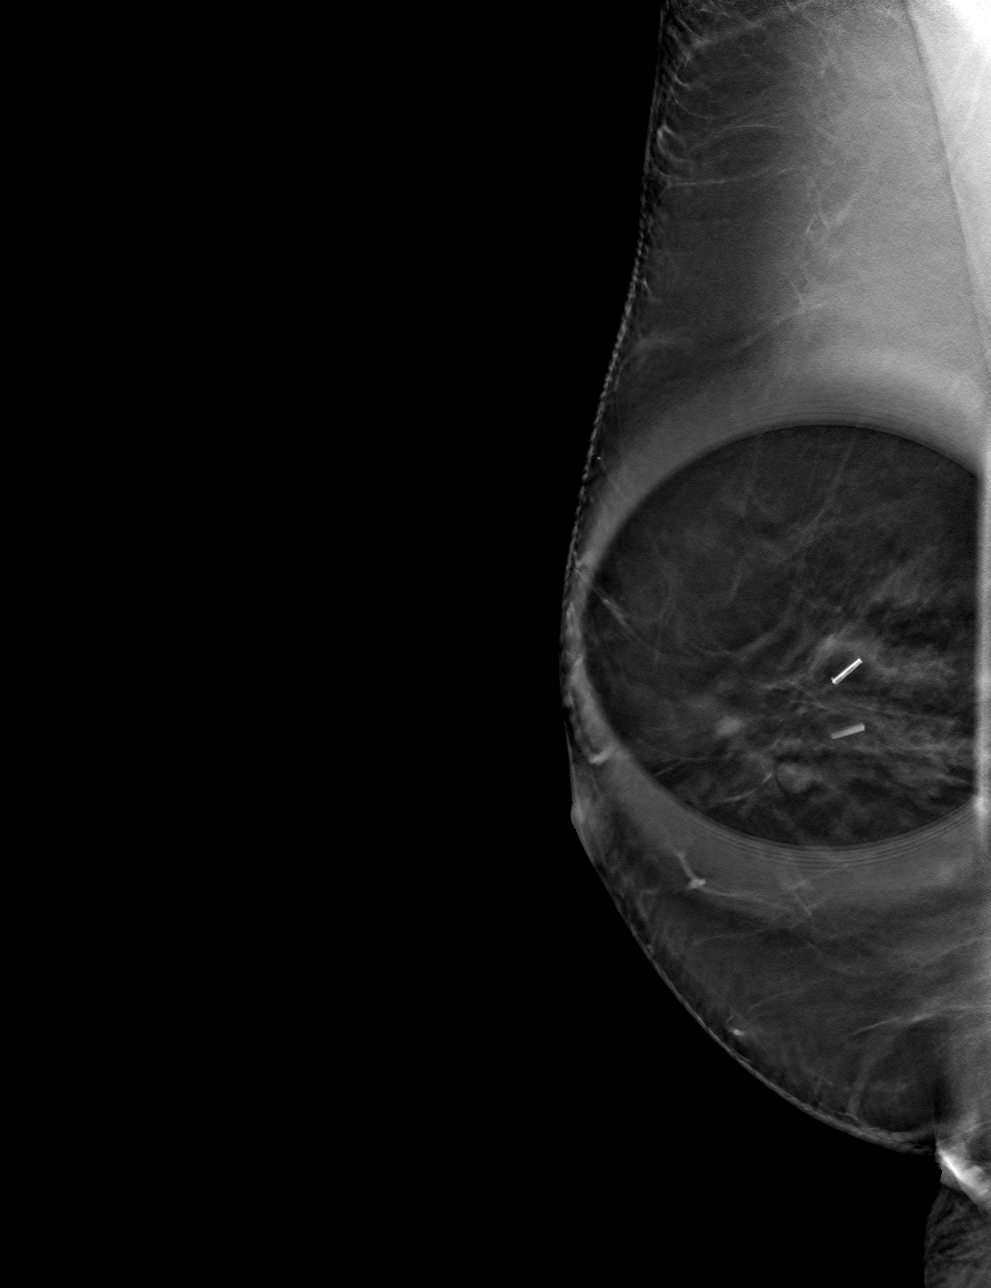

[4 of 12 positions shown; findings below may reference images not displayed]

ACR Breast Density Category b: There are scattered areas of
fibroglandular density.
FINDINGS: There are partially obscured masses within the central RIGHT breast,
largest measuring approximately 1 cm greatest dimension, adjacent to
the site of earlier surgical excision (with surgical clips in
place).

Targeted ultrasound is performed, showing 2 benign cysts in the
retroareolar RIGHT breast, 9 o'clock axis and 2 o'clock axis,
measuring 1 cm and 0.6 cm respectively, corresponding to the
mammographic findings.
IMPRESSION: No evidence of malignancy. Benign cysts in the retroareolar RIGHT
breast, largest measuring 1 cm, corresponding to the mammographic
findings.

Patient may return to routine annual bilateral screening mammogram
schedule.

RECOMMENDATION:
Screening mammogram in one year.(Code:[id number])

I have discussed the findings and recommendations with the patient.
If applicable, a reminder letter will be sent to the patient
regarding the next appointment.

BI-RADS CATEGORY  2: Benign.

ADDENDUM:
Transcription error in report. Recommendation should read as
follows:

Patient may return to routine annual RIGHT breast screening
mammogram schedule.

RIGHT breast screening mammogram in 1 year.

*** End of Addendum ***
ACR Breast Density Category b: There are scattered areas of
fibroglandular density.
FINDINGS: There are partially obscured masses within the central RIGHT breast,
largest measuring approximately 1 cm greatest dimension, adjacent to
the site of earlier surgical excision (with surgical clips in
place).

Targeted ultrasound is performed, showing 2 benign cysts in the
retroareolar RIGHT breast, 9 o'clock axis and 2 o'clock axis,
measuring 1 cm and 0.6 cm respectively, corresponding to the
mammographic findings.
IMPRESSION: No evidence of malignancy. Benign cysts in the retroareolar RIGHT
breast, largest measuring 1 cm, corresponding to the mammographic
findings.

Patient may return to routine annual bilateral screening mammogram
schedule.

RECOMMENDATION:
Screening mammogram in one year.(Code:[id number])

I have discussed the findings and recommendations with the patient.
If applicable, a reminder letter will be sent to the patient
regarding the next appointment.

BI-RADS CATEGORY  2: Benign.

## 2023-12-05 DIAGNOSIS — Z1331 Encounter for screening for depression: Secondary | ICD-10-CM | POA: Diagnosis not present

## 2023-12-05 DIAGNOSIS — Z23 Encounter for immunization: Secondary | ICD-10-CM | POA: Diagnosis not present

## 2023-12-05 DIAGNOSIS — Z0000 Encounter for general adult medical examination without abnormal findings: Secondary | ICD-10-CM | POA: Diagnosis not present

## 2023-12-20 DIAGNOSIS — J8283 Eosinophilic asthma: Secondary | ICD-10-CM | POA: Diagnosis not present

## 2023-12-20 DIAGNOSIS — Z6831 Body mass index (BMI) 31.0-31.9, adult: Secondary | ICD-10-CM | POA: Diagnosis not present

## 2023-12-20 DIAGNOSIS — F411 Generalized anxiety disorder: Secondary | ICD-10-CM | POA: Diagnosis not present

## 2023-12-20 DIAGNOSIS — G4733 Obstructive sleep apnea (adult) (pediatric): Secondary | ICD-10-CM | POA: Diagnosis not present

## 2023-12-20 DIAGNOSIS — R7303 Prediabetes: Secondary | ICD-10-CM | POA: Diagnosis not present

## 2023-12-20 DIAGNOSIS — E782 Mixed hyperlipidemia: Secondary | ICD-10-CM | POA: Diagnosis not present

## 2023-12-20 DIAGNOSIS — F5081 Binge eating disorder, mild: Secondary | ICD-10-CM | POA: Diagnosis not present

## 2023-12-20 DIAGNOSIS — K219 Gastro-esophageal reflux disease without esophagitis: Secondary | ICD-10-CM | POA: Diagnosis not present

## 2023-12-20 DIAGNOSIS — E6609 Other obesity due to excess calories: Secondary | ICD-10-CM | POA: Diagnosis not present

## 2024-01-08 DIAGNOSIS — Z6831 Body mass index (BMI) 31.0-31.9, adult: Secondary | ICD-10-CM | POA: Diagnosis not present

## 2024-01-08 DIAGNOSIS — J8283 Eosinophilic asthma: Secondary | ICD-10-CM | POA: Diagnosis not present

## 2024-01-08 DIAGNOSIS — M859 Disorder of bone density and structure, unspecified: Secondary | ICD-10-CM | POA: Diagnosis not present

## 2024-01-08 DIAGNOSIS — E669 Obesity, unspecified: Secondary | ICD-10-CM | POA: Diagnosis not present

## 2024-01-08 DIAGNOSIS — E559 Vitamin D deficiency, unspecified: Secondary | ICD-10-CM | POA: Diagnosis not present

## 2024-01-08 DIAGNOSIS — F411 Generalized anxiety disorder: Secondary | ICD-10-CM | POA: Diagnosis not present

## 2024-01-08 DIAGNOSIS — R7303 Prediabetes: Secondary | ICD-10-CM | POA: Diagnosis not present

## 2024-01-08 DIAGNOSIS — E782 Mixed hyperlipidemia: Secondary | ICD-10-CM | POA: Diagnosis not present

## 2024-01-17 DIAGNOSIS — F5081 Binge eating disorder, mild: Secondary | ICD-10-CM | POA: Diagnosis not present

## 2024-01-17 DIAGNOSIS — F411 Generalized anxiety disorder: Secondary | ICD-10-CM | POA: Diagnosis not present

## 2024-01-17 DIAGNOSIS — E782 Mixed hyperlipidemia: Secondary | ICD-10-CM | POA: Diagnosis not present

## 2024-01-17 DIAGNOSIS — E6609 Other obesity due to excess calories: Secondary | ICD-10-CM | POA: Diagnosis not present

## 2024-01-17 DIAGNOSIS — K219 Gastro-esophageal reflux disease without esophagitis: Secondary | ICD-10-CM | POA: Diagnosis not present

## 2024-01-17 DIAGNOSIS — J8283 Eosinophilic asthma: Secondary | ICD-10-CM | POA: Diagnosis not present

## 2024-01-17 DIAGNOSIS — R7303 Prediabetes: Secondary | ICD-10-CM | POA: Diagnosis not present

## 2024-01-17 DIAGNOSIS — G4733 Obstructive sleep apnea (adult) (pediatric): Secondary | ICD-10-CM | POA: Diagnosis not present

## 2024-01-17 DIAGNOSIS — Z6831 Body mass index (BMI) 31.0-31.9, adult: Secondary | ICD-10-CM | POA: Diagnosis not present

## 2024-02-13 DIAGNOSIS — G4733 Obstructive sleep apnea (adult) (pediatric): Secondary | ICD-10-CM | POA: Diagnosis not present

## 2024-02-27 DIAGNOSIS — G4733 Obstructive sleep apnea (adult) (pediatric): Secondary | ICD-10-CM | POA: Diagnosis not present

## 2024-02-27 DIAGNOSIS — E782 Mixed hyperlipidemia: Secondary | ICD-10-CM | POA: Diagnosis not present

## 2024-02-27 DIAGNOSIS — K219 Gastro-esophageal reflux disease without esophagitis: Secondary | ICD-10-CM | POA: Diagnosis not present

## 2024-02-27 DIAGNOSIS — Z6832 Body mass index (BMI) 32.0-32.9, adult: Secondary | ICD-10-CM | POA: Diagnosis not present

## 2024-02-27 DIAGNOSIS — F5081 Binge eating disorder, mild: Secondary | ICD-10-CM | POA: Diagnosis not present

## 2024-02-27 DIAGNOSIS — R7303 Prediabetes: Secondary | ICD-10-CM | POA: Diagnosis not present

## 2024-02-27 DIAGNOSIS — J8283 Eosinophilic asthma: Secondary | ICD-10-CM | POA: Diagnosis not present

## 2024-02-27 DIAGNOSIS — E6609 Other obesity due to excess calories: Secondary | ICD-10-CM | POA: Diagnosis not present

## 2024-02-27 DIAGNOSIS — F411 Generalized anxiety disorder: Secondary | ICD-10-CM | POA: Diagnosis not present

## 2024-04-08 DIAGNOSIS — E782 Mixed hyperlipidemia: Secondary | ICD-10-CM | POA: Diagnosis not present

## 2024-04-08 DIAGNOSIS — E6609 Other obesity due to excess calories: Secondary | ICD-10-CM | POA: Diagnosis not present

## 2024-04-08 DIAGNOSIS — K219 Gastro-esophageal reflux disease without esophagitis: Secondary | ICD-10-CM | POA: Diagnosis not present

## 2024-04-08 DIAGNOSIS — Z6832 Body mass index (BMI) 32.0-32.9, adult: Secondary | ICD-10-CM | POA: Diagnosis not present

## 2024-04-08 DIAGNOSIS — R7303 Prediabetes: Secondary | ICD-10-CM | POA: Diagnosis not present

## 2024-04-08 DIAGNOSIS — F411 Generalized anxiety disorder: Secondary | ICD-10-CM | POA: Diagnosis not present

## 2024-04-08 DIAGNOSIS — F5081 Binge eating disorder, mild: Secondary | ICD-10-CM | POA: Diagnosis not present

## 2024-04-08 DIAGNOSIS — G4733 Obstructive sleep apnea (adult) (pediatric): Secondary | ICD-10-CM | POA: Diagnosis not present

## 2024-09-25 ENCOUNTER — Telehealth: Admitting: Adult Health
